# Patient Record
Sex: Male | Born: 1960 | Race: White | Hispanic: No | Marital: Single | Smoking: Current every day smoker
Health system: Southern US, Community
[De-identification: ages and names within clinical notes are randomized; demographics above are authoritative.]

## PROBLEM LIST (undated history)

## (undated) DIAGNOSIS — G8929 Other chronic pain: Secondary | ICD-10-CM

## (undated) DIAGNOSIS — B1920 Unspecified viral hepatitis C without hepatic coma: Secondary | ICD-10-CM

## (undated) DIAGNOSIS — K7460 Unspecified cirrhosis of liver: Secondary | ICD-10-CM

## (undated) DIAGNOSIS — D696 Thrombocytopenia, unspecified: Secondary | ICD-10-CM

## (undated) DIAGNOSIS — E039 Hypothyroidism, unspecified: Secondary | ICD-10-CM

## (undated) HISTORY — DX: Unspecified cirrhosis of liver: K74.60

## (undated) HISTORY — PX: CHOLECYSTECTOMY: SHX55

## (undated) HISTORY — DX: Thrombocytopenia, unspecified: D69.6

## (undated) HISTORY — PX: BACK SURGERY: SHX140

## (undated) HISTORY — DX: Unspecified viral hepatitis C without hepatic coma: B19.20

---

## 2004-02-12 ENCOUNTER — Emergency Department (HOSPITAL_COMMUNITY): Admission: EM | Admit: 2004-02-12 | Discharge: 2004-02-13 | Payer: Self-pay | Admitting: Emergency Medicine

## 2007-08-13 ENCOUNTER — Emergency Department (HOSPITAL_COMMUNITY): Admission: EM | Admit: 2007-08-13 | Discharge: 2007-08-13 | Payer: Self-pay | Admitting: Emergency Medicine

## 2007-09-09 ENCOUNTER — Emergency Department (HOSPITAL_COMMUNITY): Admission: EM | Admit: 2007-09-09 | Discharge: 2007-09-09 | Payer: Self-pay | Admitting: Emergency Medicine

## 2007-09-22 ENCOUNTER — Inpatient Hospital Stay (HOSPITAL_COMMUNITY): Admission: EM | Admit: 2007-09-22 | Discharge: 2007-10-01 | Payer: Self-pay | Admitting: Emergency Medicine

## 2007-10-15 ENCOUNTER — Emergency Department (HOSPITAL_COMMUNITY): Admission: EM | Admit: 2007-10-15 | Discharge: 2007-10-15 | Payer: Self-pay | Admitting: Emergency Medicine

## 2008-01-09 ENCOUNTER — Emergency Department (HOSPITAL_COMMUNITY): Admission: EM | Admit: 2008-01-09 | Discharge: 2008-01-09 | Payer: Self-pay | Admitting: Emergency Medicine

## 2008-03-23 ENCOUNTER — Emergency Department (HOSPITAL_COMMUNITY): Admission: EM | Admit: 2008-03-23 | Discharge: 2008-03-23 | Payer: Self-pay | Admitting: Emergency Medicine

## 2008-08-05 ENCOUNTER — Emergency Department (HOSPITAL_COMMUNITY): Admission: EM | Admit: 2008-08-05 | Discharge: 2008-08-05 | Payer: Self-pay | Admitting: Emergency Medicine

## 2008-08-27 ENCOUNTER — Emergency Department (HOSPITAL_COMMUNITY): Admission: EM | Admit: 2008-08-27 | Discharge: 2008-08-27 | Payer: Self-pay | Admitting: Emergency Medicine

## 2008-12-08 ENCOUNTER — Emergency Department (HOSPITAL_COMMUNITY): Admission: EM | Admit: 2008-12-08 | Discharge: 2008-12-08 | Payer: Self-pay | Admitting: Emergency Medicine

## 2009-03-26 ENCOUNTER — Emergency Department (HOSPITAL_COMMUNITY): Admission: EM | Admit: 2009-03-26 | Discharge: 2009-03-26 | Payer: Self-pay | Admitting: Emergency Medicine

## 2009-03-28 ENCOUNTER — Emergency Department (HOSPITAL_COMMUNITY): Admission: EM | Admit: 2009-03-28 | Discharge: 2009-03-28 | Payer: Self-pay | Admitting: Emergency Medicine

## 2009-03-30 ENCOUNTER — Emergency Department (HOSPITAL_COMMUNITY): Admission: EM | Admit: 2009-03-30 | Discharge: 2009-03-30 | Payer: Self-pay | Admitting: Emergency Medicine

## 2009-04-17 ENCOUNTER — Emergency Department (HOSPITAL_COMMUNITY): Admission: EM | Admit: 2009-04-17 | Discharge: 2009-04-17 | Payer: Self-pay | Admitting: Emergency Medicine

## 2009-08-25 ENCOUNTER — Emergency Department (HOSPITAL_COMMUNITY): Admission: EM | Admit: 2009-08-25 | Discharge: 2009-08-25 | Payer: Self-pay | Admitting: Emergency Medicine

## 2009-11-05 ENCOUNTER — Emergency Department (HOSPITAL_COMMUNITY): Admission: EM | Admit: 2009-11-05 | Discharge: 2009-11-05 | Payer: Self-pay | Admitting: Emergency Medicine

## 2009-12-16 ENCOUNTER — Emergency Department (HOSPITAL_COMMUNITY): Admission: EM | Admit: 2009-12-16 | Discharge: 2009-12-16 | Payer: Self-pay | Admitting: Emergency Medicine

## 2010-01-28 ENCOUNTER — Emergency Department (HOSPITAL_COMMUNITY): Admission: EM | Admit: 2010-01-28 | Discharge: 2010-01-28 | Payer: Self-pay | Admitting: Emergency Medicine

## 2010-04-15 ENCOUNTER — Ambulatory Visit: Payer: Self-pay | Admitting: Cardiology

## 2010-04-15 ENCOUNTER — Observation Stay (HOSPITAL_COMMUNITY): Admission: EM | Admit: 2010-04-15 | Discharge: 2010-04-17 | Payer: Self-pay | Admitting: Emergency Medicine

## 2010-04-16 ENCOUNTER — Encounter (INDEPENDENT_AMBULATORY_CARE_PROVIDER_SITE_OTHER): Payer: Self-pay | Admitting: Internal Medicine

## 2010-06-10 ENCOUNTER — Emergency Department (HOSPITAL_COMMUNITY): Admission: EM | Admit: 2010-06-10 | Discharge: 2010-06-10 | Payer: Self-pay | Admitting: Emergency Medicine

## 2010-07-31 ENCOUNTER — Emergency Department (HOSPITAL_COMMUNITY)
Admission: EM | Admit: 2010-07-31 | Discharge: 2010-07-31 | Payer: Self-pay | Source: Home / Self Care | Admitting: Emergency Medicine

## 2010-08-05 LAB — URINALYSIS, ROUTINE W REFLEX MICROSCOPIC
Bilirubin Urine: NEGATIVE
Hgb urine dipstick: NEGATIVE
Ketones, ur: NEGATIVE mg/dL
Nitrite: NEGATIVE
Protein, ur: NEGATIVE mg/dL
Specific Gravity, Urine: 1.025 (ref 1.005–1.030)
Urine Glucose, Fasting: NEGATIVE mg/dL
Urobilinogen, UA: 1 mg/dL (ref 0.0–1.0)
pH: 5 (ref 5.0–8.0)

## 2010-08-20 IMAGING — CT CT L SPINE W/O CM
3 series · 10 of 33 positions shown, 12 images · non-contrast
Comparison: Lumbar spine series 08/25/2009

CLINICAL DATA: Fell.  Back pain.

CT LUMBAR SPINE WITHOUT CONTRAST
TECHNIQUE: Multidetector CT imaging of the lumbar spine was
performed without intravenous contrast administration. Multiplanar
CT image reconstructions were also generated.

[Series 3: lumbar spine 2.0 b30s · axial · 0.28mm/px · z∈[-228,-122]mm · 2 of 116 slices shown, 3 images]
[im 36/116  soft-tissue]
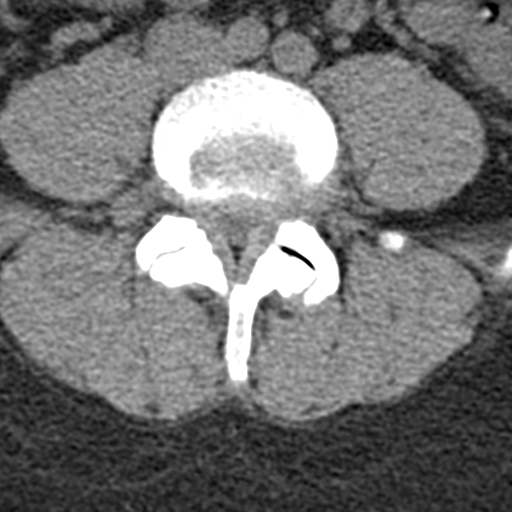
[im 36/116  bone]
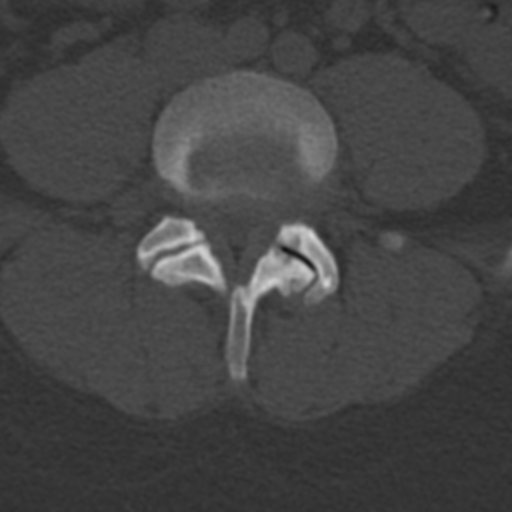
[im 89/116  bone]
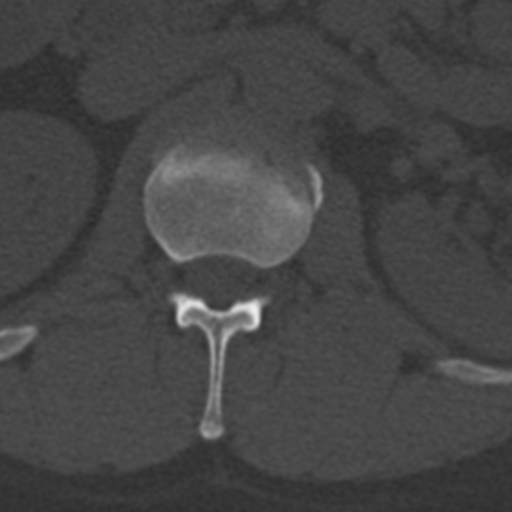

[Series 4: lumbar spine 2.0 spo cor · coronal · 0.30mm/px · 3 of 55 slices shown]
[im 11/55  bone]
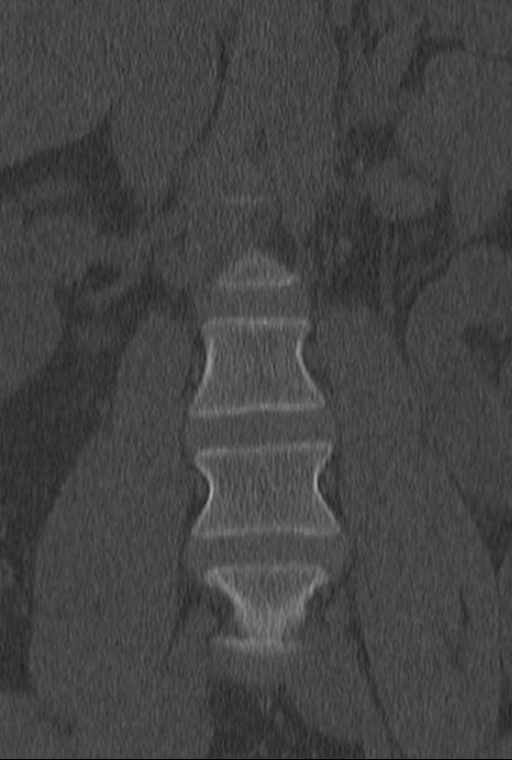
[im 22/55  bone]
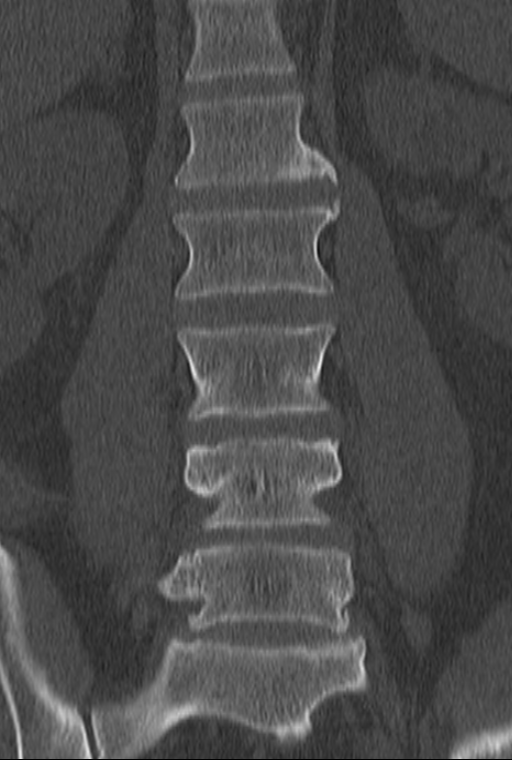
[im 33/55  bone]
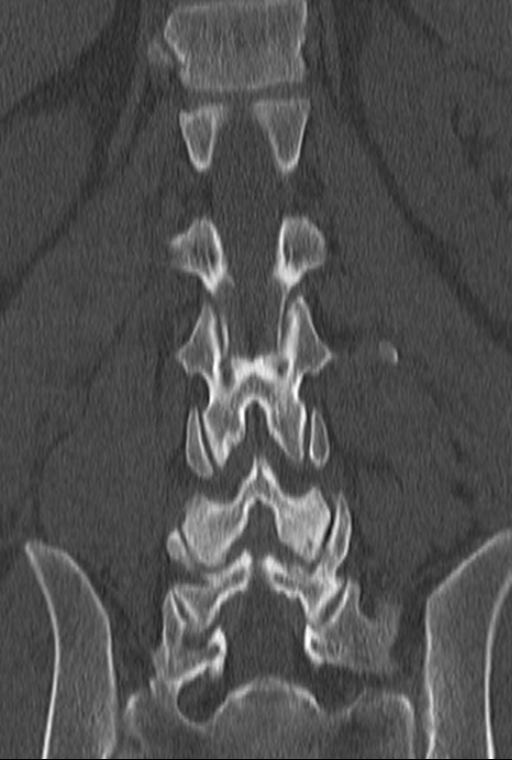

[Series 5: lumbar spine 2.0 spo sag · sagittal · 0.22mm/px · 5 of 69 slices shown, 6 images]
[im 23/69  bone]
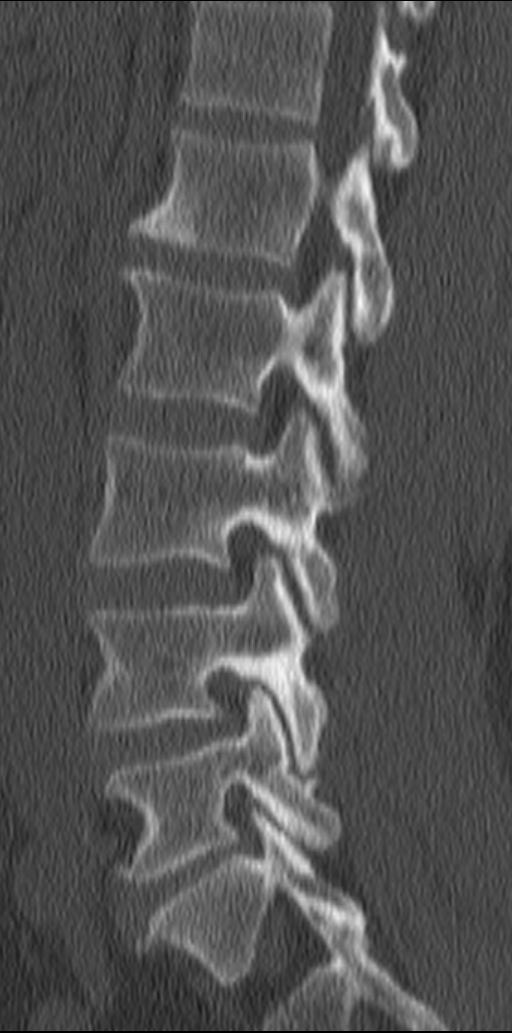
[im 29/69  bone]
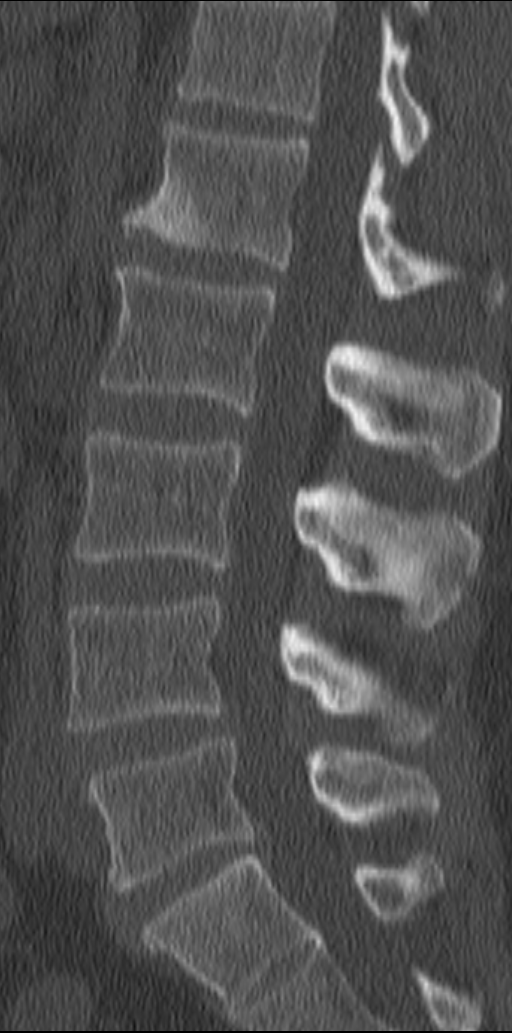
[im 35/69  soft-tissue]
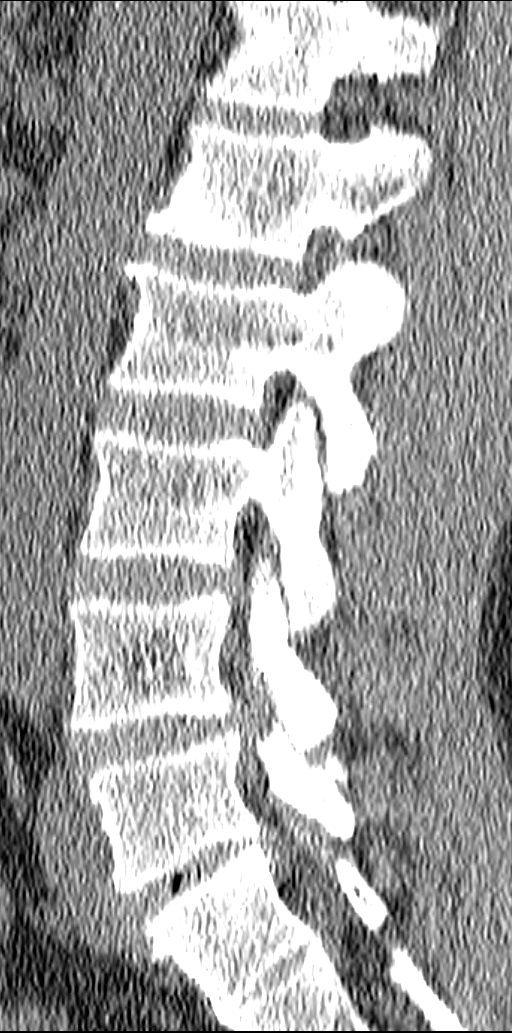
[im 35/69  bone]
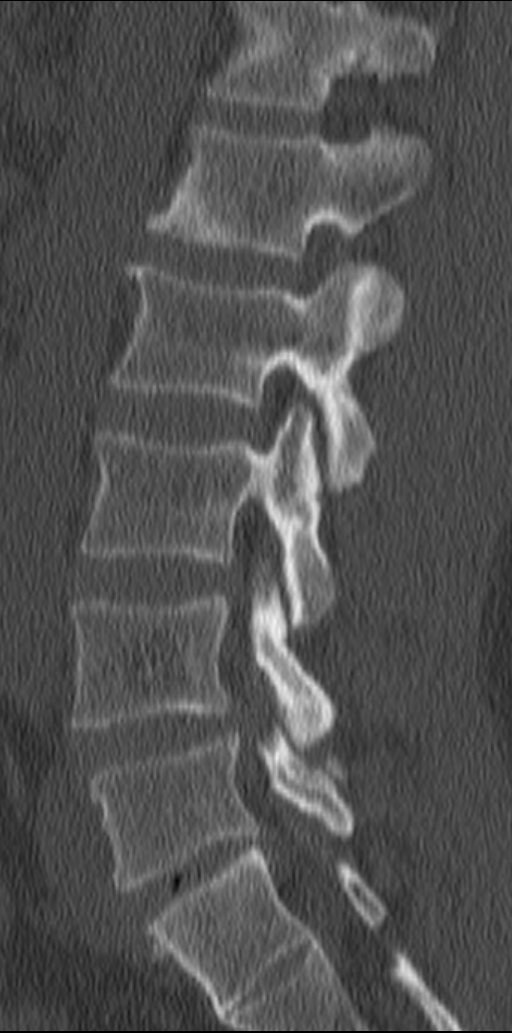
[im 40/69  bone]
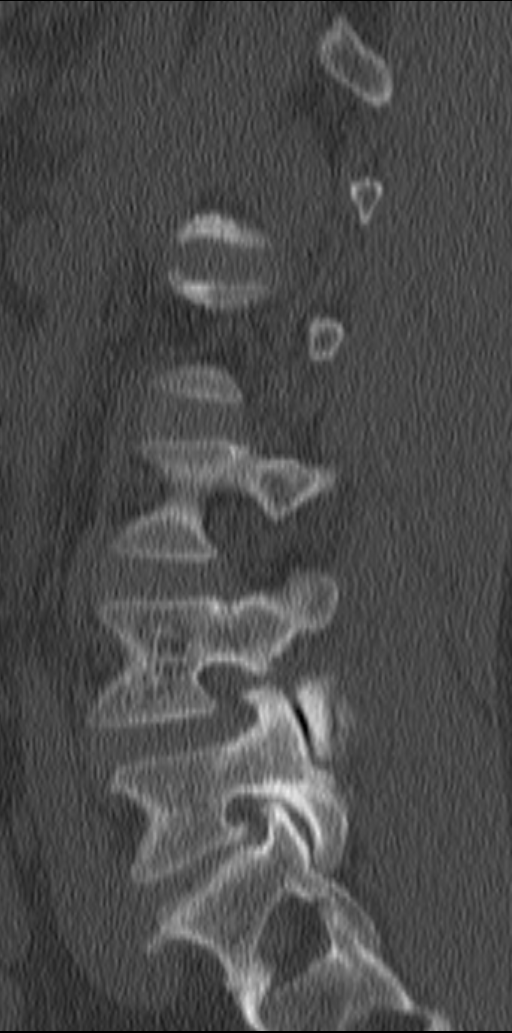
[im 46/69  bone]
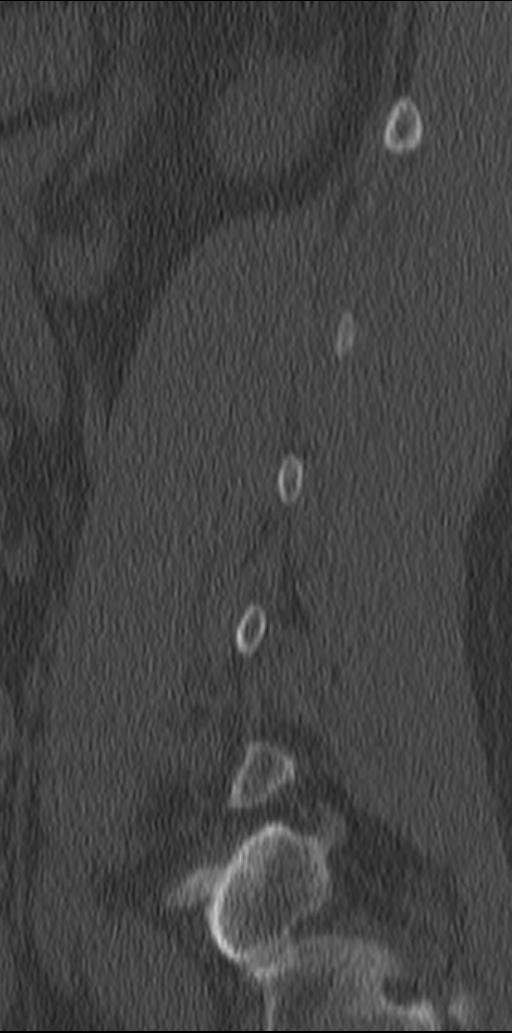

[10 of 33 positions shown; findings below may reference images not displayed]

FINDINGS: The sagittal reformatted images demonstrate normal
alignment of the lumbar vertebral bodies.  No acute fractures.
Disc spaces are maintained.  The facets are normally aligned.  No
pars defects.  Moderate facet degenerative changes in the lower
lumbar spine.

No significant retroperitoneal findings are demonstrated.

No large disc protrusions are seen.  There is moderate to moderate
severe spinal and lateral recess stenosis at L4-5 due to a broad-
based bulging annulus, short pedicles and facet disease.
IMPRESSION: 1.  No acute bony findings.
2.  Moderate facet degenerative changes in the lower lumbar spine
without facet fracture or pars defect.
3.  Moderate to moderately severe multifactorial spinal and lateral
recess stenosis at L4-5.

## 2010-09-25 LAB — LIPID PANEL
HDL: 46 mg/dL (ref >39)
Total CHOL/HDL Ratio: 4.2 RATIO
Triglycerides: 249 mg/dL — ABNORMAL HIGH (ref <150)
VLDL: 50 mg/dL — ABNORMAL HIGH (ref 0–40)

## 2010-09-25 LAB — FOLATE: Folate: 18.3 ng/mL

## 2010-09-25 LAB — CBC
HCT: 43.7 % (ref 39.0–52.0)
Hemoglobin: 15.1 g/dL (ref 13.0–17.0)
MCH: 32.5 pg (ref 26.0–34.0)
MCHC: 34.6 g/dL (ref 30.0–36.0)
MCV: 94 fL (ref 78.0–100.0)
Platelets: 115 10*3/uL — ABNORMAL LOW (ref 150–400)
RBC: 4.65 MIL/uL (ref 4.22–5.81)
RDW: 12.5 % (ref 11.5–15.5)
WBC: 5.6 10*3/uL (ref 4.0–10.5)

## 2010-09-25 LAB — URINALYSIS, ROUTINE W REFLEX MICROSCOPIC
Glucose, UA: NEGATIVE mg/dL
Hgb urine dipstick: NEGATIVE
Ketones, ur: NEGATIVE mg/dL
Protein, ur: NEGATIVE mg/dL
pH: 5.5 (ref 5.0–8.0)

## 2010-09-25 LAB — CARDIAC PANEL(CRET KIN+CKTOT+MB+TROPI)
CK, MB: 0.8 ng/mL (ref 0.3–4.0)
CK, MB: 0.9 ng/mL (ref 0.3–4.0)
Relative Index: INVALID (ref 0.0–2.5)
Troponin I: <0.01 ng/mL (ref 0.00–0.06)

## 2010-09-25 LAB — DIFFERENTIAL
Eosinophils Relative: 7 % — ABNORMAL HIGH (ref 0–5)
Lymphocytes Relative: 53 % — ABNORMAL HIGH (ref 12–46)
Lymphs Abs: 3 10*3/uL (ref 0.7–4.0)
Monocytes Absolute: 0.4 10*3/uL (ref 0.1–1.0)
Monocytes Relative: 7 % (ref 3–12)
Neutro Abs: 1.8 10*3/uL (ref 1.7–7.7)

## 2010-09-25 LAB — COMPREHENSIVE METABOLIC PANEL
AST: 40 U/L — ABNORMAL HIGH (ref 0–37)
Albumin: 3.7 g/dL (ref 3.5–5.2)
BUN: 10 mg/dL (ref 6–23)
Chloride: 100 mEq/L (ref 96–112)
Creatinine, Ser: 1 mg/dL (ref 0.4–1.5)
GFR calc Af Amer: >60 mL/min (ref >60)
Total Bilirubin: 0.8 mg/dL (ref 0.3–1.2)
Total Protein: 7.1 g/dL (ref 6.0–8.3)

## 2010-09-25 LAB — RAPID URINE DRUG SCREEN, HOSP PERFORMED
Amphetamines: NOT DETECTED
Benzodiazepines: NOT DETECTED
Cocaine: NOT DETECTED

## 2010-09-25 LAB — POCT CARDIAC MARKERS
Myoglobin, poc: 54.6 ng/mL (ref 12–200)
Myoglobin, poc: 56 ng/mL (ref 12–200)

## 2010-09-25 LAB — AMMONIA: Ammonia: 47 umol/L — ABNORMAL HIGH (ref 11–35)

## 2010-09-25 LAB — VITAMIN B12: Vitamin B-12: 1160 pg/mL — ABNORMAL HIGH (ref 211–911)

## 2010-10-01 LAB — COMPREHENSIVE METABOLIC PANEL
ALT: 24 U/L (ref 0–53)
Alkaline Phosphatase: 62 U/L (ref 39–117)
BUN: 17 mg/dL (ref 6–23)
CO2: 24 mEq/L (ref 19–32)
Chloride: 103 mEq/L (ref 96–112)
GFR calc non Af Amer: >60 mL/min (ref >60)
Glucose, Bld: 109 mg/dL — ABNORMAL HIGH (ref 70–99)
Potassium: 3.7 mEq/L (ref 3.5–5.1)
Sodium: 135 mEq/L (ref 135–145)
Total Bilirubin: 0.3 mg/dL (ref 0.3–1.2)

## 2010-10-01 LAB — CBC
HCT: 42.1 % (ref 39.0–52.0)
Hemoglobin: 14.8 g/dL (ref 13.0–17.0)
RBC: 4.57 MIL/uL (ref 4.22–5.81)

## 2010-10-01 LAB — DIFFERENTIAL
Basophils Absolute: 0 10*3/uL (ref 0.0–0.1)
Basophils Relative: 0 % (ref 0–1)
Eosinophils Absolute: 0.3 10*3/uL (ref 0.0–0.7)
Monocytes Relative: 7 % (ref 3–12)
Neutrophils Relative %: 38 % — ABNORMAL LOW (ref 43–77)

## 2010-10-01 LAB — URINALYSIS, ROUTINE W REFLEX MICROSCOPIC
Glucose, UA: NEGATIVE mg/dL
Nitrite: NEGATIVE
Specific Gravity, Urine: 1.025 (ref 1.005–1.030)
pH: 5 (ref 5.0–8.0)

## 2010-10-07 ENCOUNTER — Emergency Department (HOSPITAL_COMMUNITY): Payer: Self-pay

## 2010-10-07 ENCOUNTER — Emergency Department (HOSPITAL_COMMUNITY)
Admission: EM | Admit: 2010-10-07 | Discharge: 2010-10-07 | Disposition: A | Discharge status: Home / Self Care | Payer: Self-pay | Attending: Emergency Medicine | Admitting: Emergency Medicine

## 2010-10-07 DIAGNOSIS — W010XXA Fall on same level from slipping, tripping and stumbling without subsequent striking against object, initial encounter: Secondary | ICD-10-CM | POA: Insufficient documentation

## 2010-10-07 DIAGNOSIS — S79919A Unspecified injury of unspecified hip, initial encounter: Secondary | ICD-10-CM | POA: Insufficient documentation

## 2010-10-07 DIAGNOSIS — Y92009 Unspecified place in unspecified non-institutional (private) residence as the place of occurrence of the external cause: Secondary | ICD-10-CM | POA: Insufficient documentation

## 2010-10-07 DIAGNOSIS — M5430 Sciatica, unspecified side: Secondary | ICD-10-CM | POA: Insufficient documentation

## 2010-10-18 LAB — CULTURE, ROUTINE-ABSCESS

## 2010-10-29 LAB — CBC
HCT: 46.6 % (ref 39.0–52.0)
MCV: 94.3 fL (ref 78.0–100.0)
Platelets: 147 10*3/uL — ABNORMAL LOW (ref 150–400)
RBC: 4.95 MIL/uL (ref 4.22–5.81)
WBC: 8.4 10*3/uL (ref 4.0–10.5)

## 2010-10-29 LAB — BASIC METABOLIC PANEL
BUN: 17 mg/dL (ref 6–23)
CO2: 27 mEq/L (ref 19–32)
Chloride: 102 mEq/L (ref 96–112)
Potassium: 3.7 mEq/L (ref 3.5–5.1)

## 2010-10-29 LAB — DIFFERENTIAL
Eosinophils Absolute: 0.2 10*3/uL (ref 0.0–0.7)
Eosinophils Relative: 2 % (ref 0–5)
Lymphs Abs: 2.7 10*3/uL (ref 0.7–4.0)
Monocytes Relative: 5 % (ref 3–12)

## 2010-11-26 NOTE — Group Therapy Note (Signed)
NAME:  Jeremy Weeks, Jeremy Weeks               ACCOUNT NO.:  [id number]   MEDICAL RECORD NO.:  [id number]          PATIENT TYPE:  INP   LOCATION:  A228                          FACILITY:  [facility]   PHYSICIAN:  Skeet Latch, DO    DATE OF BIRTH:  March 14, 1961   DATE OF PROCEDURE:  09/29/2007  DATE OF DISCHARGE:                                 PROGRESS NOTE   SUBJECTIVE:  Patient continues to be improved.  Patient states that his  breathing feels better.  Once he receives pain medications, he can take  a deep inspiration and have a productive cough.  No fevers or chills.  Patient's intake is improving.  The patient was seen by ACT team and  agreed to have outpatient treatment.   OBJECTIVE:  GENERAL:  He is awake, alert and oriented in no acute  distress.  VITALS:  Temperature is 98.7, pulse 110, respirations 22, blood pressure  136/72.  He is satting at 92% on 2 liters.  CARDIOVASCULAR:  He is tachycardic.  S1 and S2 is regular.  No murmurs  or gallops.  LUNGS:  Decreased breath sounds in the right upper lobe.  Left is clear.  No rhonchi or wheezing.  ABDOMEN:  Soft, nontender, nondistended.  Positive bowel sounds.  EXTREMITIES:  Trace edema.  No tenderness or erythema.   LABS:  Sodium is 133, potassium 3.1, chloride 99, CO2 25, glucose 110,  BUN 7, creatinine 0.77.  His white blood cell count is 11.8, hemoglobin  12.8, hematocrit 36.3, platelet count 157.   ASSESSMENT/PLAN:  1. Acute respiratory failure requiring mechanical ventilation:      Patient seems to be doing better.  He continues to be on nasal      cannula and seems to be improving.  Shortness of breath has      improved.  Patient is being followed by pulmonology.  2. Pneumonia:  Likely aspiration.  Patient continues to be on IV      antibiotics.  We will continue this and continue to follow his      white count.  Patient is afebrile.  3. Acute rhabdomyolysis secondary to prior cocaine use.  We will      continue to follow PT  levels and IV fluids.  4. Suicide/drug overdose:  Patient will be evaluated by the ACT team.      Patient did sign a release stating that he will get outpatient      treatment.  Will continue to monitor.  He will continue to be      counseled before he is discharged.  5. Thrombocytopenia:  Will continue to monitor.  6. Nicotine abuse:  Patient is on a patch and will continue that at      this time.  7. Hyponatremia:  Will hep lock his IV at this time and recheck his      sodium in the morning.  8. Hypokalemia:  We will replace orally.      Skeet Latch, DO  Electronically Signed    SM/MEDQ  D:  09/29/2007  T:  09/29/2007  Job:  [id number]

## 2010-11-26 NOTE — Group Therapy Note (Signed)
NAME:  Jeremy Weeks, Jeremy Weeks               ACCOUNT NO.:  [id number]   MEDICAL RECORD NO.:  [id number]          PATIENT TYPE:  INP   LOCATION:  IC03                          FACILITY:  [facility]   PHYSICIAN:  Edward L. Juanetta Gosling, M.D.DATE OF BIRTH:  [birth year]   DATE OF PROCEDURE:  DATE OF DISCHARGE:                                 PROGRESS NOTE   SUBJECTIVE:  Jeremy Weeks seems to be about the same.  He remains somewhat  short of breath and his x-ray actually shows some left lung changes now  as well.  His right lung is still densely consolidated.   PHYSICAL EXAMINATION:  VITALS:  His physical examination otherwise,  pulse is about 110, blood pressure in the 100 range.  CHEST:  Shows decreased breath sounds.  HEART:  Regular.   LABORATORY DATA:  Laboratory work, white count 14,900 so that is better,  hemoglobin 12.7, platelets 107, blood gas on 3 liters, pO2 of 59, pCO2  of 31, pH 7.43 with an O2 sat of 92%.  B met shows potassium 3.4,  otherwise normal and his CK 1769.   ASSESSMENT:  He has what I suspect is aspiration pneumonia.  He has had  a drug overdose in a suicide attempt.  He will need to be reevaluated by  the ACT team but he cannot be medically cleared as yet to be  transferred. He has elevated CPK which is of unknown etiology but may be  related to his cocaine use and he has a significant pneumonia.  We are  going to try to get him up.      Edward L. Juanetta Gosling, M.D.  Electronically Signed     ELH/MEDQ  D:  09/27/2007  T:  09/27/2007  Job:  [id number]

## 2010-11-26 NOTE — Group Therapy Note (Signed)
NAME:  Jeremy, Weeks               ACCOUNT NO.:  [id number]   MEDICAL RECORD NO.:  [id number]          PATIENT TYPE:  INP   LOCATION:  IC03                          FACILITY:  [facility]   PHYSICIAN:  Edward L. Juanetta Gosling, M.D.DATE OF BIRTH:  12-Sep-1960   DATE OF PROCEDURE:  DATE OF DISCHARGE:                                 PROGRESS NOTE   Jeremy Weeks looks a little better this morning.  He says he is having  more chest pain.  He is coughing up some sputum and now has some blood  mixed in it. He does not look as dyspneic.  Otherwise he has no new  complaints.   PHYSICAL EXAM:  VITALS:  Shows his heart rate about 113, blood pressure  in the 120s.  CHEST:  Still shows decreased breath sounds and some rhonchi but he is  clearer than he was yesterday.  HEART:  Regular without gallop but with a tachycardia.  EXTREMITIES:  Showed no edema.  CENTRAL NERVOUS SYSTEM:  Examination grossly intact.   LABORATORY DATA:  His lab work this morning shows blood gas on 2 liters.  His pO2 63, pCO2 of 38, pH 7.39.  CBC shows white count 10,800,  hemoglobin 12.6, platelets 135.  Liver function panel shows SGOT is 61,  albumin is 2.3. B met shows potassium of 3.3 but normal electrolytes  otherwise. His CK is down to 887.  His magnesium is 1.9.  Chest x-ray  still shows a right lower lobe pneumonia.   ASSESSMENT:  He has pneumonia.  He has had a drug overdose with  amitriptyline as well as illicit drugs of cocaine.  He does seem better.  I think he is probably okay to move from the intensive care unit today.  He of course still has to be evaluated by the ACT team again and then  decide what to do from there.      Edward L. Juanetta Gosling, M.D.  Electronically Signed     ELH/MEDQ  D:  09/28/2007  T:  09/28/2007  Job:  [id number]

## 2010-11-26 NOTE — Group Therapy Note (Signed)
NAME:  Jeremy Weeks, Jeremy Weeks               ACCOUNT NO.:  [id number]   MEDICAL RECORD NO.:  [id number]          PATIENT TYPE:  INP   LOCATION:  A228                          FACILITY:  [facility]   PHYSICIAN:  Edward L. Juanetta Gosling, M.D.DATE OF BIRTH:  [birth year]   DATE OF PROCEDURE:  DATE OF DISCHARGE:                                 PROGRESS NOTE   [name] seems to be doing much better.  He is a patient of the [facility] hospitalist team.  He says he is getting stronger.  He is  coughing up some sputum.  He is not having much new problem.   EXAM:  This morning shows a temperature of 97.7, pulse is 113,  respirations 22, blood pressure 126/71, O2 sats 92% on 2 liters.  His  chest is clearer.  He still coughing up some blood.  His heart is  regular.  CBC shows white count is down to 14,000, hemoglobin is 11.6,  platelets, 264.  His BMET is normal, so I think he is getting much  closer to being ready for discharge.  His chest x-ray yesterday shows  improvement.   Dictation Ended At This [employer]. Juanetta Gosling, M.D.  Electronically Signed     ELH/MEDQ  D:  10/01/2007  T:  10/01/2007  Job:  [id number]

## 2010-11-26 NOTE — Group Therapy Note (Signed)
NAME:  Jeremy Weeks, Jeremy Weeks               ACCOUNT NO.:  [id number]   MEDICAL RECORD NO.:  [id number]          PATIENT TYPE:  INP   LOCATION:  A228                          FACILITY:  [facility]   PHYSICIAN:  Edward L. Juanetta Gosling, M.D.DATE OF BIRTH:  1961-03-20   DATE OF PROCEDURE:  09/29/2007  DATE OF DISCHARGE:                                 PROGRESS NOTE   Patient of the [facility] Team.   Jeremy Weeks says he does not feel as well but he looks better this  morning.  He is still somewhat short of breath and he is still coughing  up some sputum which is blood-tinged.  He was evaluated by the ACT team  yesterday, saying he signed a contract to agree to go and do outpatient  therapy.   His physical examination this morning shows his temperature is 98, pulse  103, respirations 22, blood pressure 150/91, O2 saturation is 95%.  His chest is a little clearer than it has been, still with some rhonchi.  His heart is regular without gallop.   His white blood count 11,800, hemoglobin is 12.8, platelets 157.  His  potassium is 3.1, sodium slightly low 133, blood sugar was 110.   ASSESSMENT:  He has pneumonia, likely aspiration on the basis of his  drug overdose and suicide attempt, or least suicide gesture.  He has had  a drug overdose.  He is clearing up but is not clear.   My plan, then, would be to continue his treatments, continue trying to  get him up and moving, getting him to try to cough up sputum if  possible, get another chest x-ray in the morning, and we can get a PA  and lateral chest, which will help some with further evaluation.  He has  only had portable chests thus far.      Edward L. Juanetta Gosling, M.D.  Electronically Signed     ELH/MEDQ  D:  09/29/2007  T:  09/29/2007  Job:  [id number]

## 2010-11-26 NOTE — Group Therapy Note (Signed)
NAME:  SPURGEON, GANCARZ               ACCOUNT NO.:  [id number]   MEDICAL RECORD NO.:  [id number]          PATIENT TYPE:  INP   LOCATION:  IC03                          FACILITY:  [facility]   PHYSICIAN:  Margaretmary Dys, M.D.DATE OF BIRTH:  17-Aug-1960   DATE OF PROCEDURE:  09/27/2007  DATE OF DISCHARGE:                                 PROGRESS NOTE   SUBJECTIVE:  Mr. Massing remains fairly short of breath.  His respiratory  rate  has been ranging between 25 to 35.  He complains of some right-  sided pleuritic chest pain.  He has had no fever or chills.  He still  remains tachycardic.  Patient is able to take p.o. now.   The patient's  family including his brother and sister were present at  his bedside.  Patient is not medically clear yet for a transfer for  psychiatric evaluation.  We appreciate the continued input by Dr.  Juanetta Gosling.   OBJECTIVE:  Patient was conscious alert, in mild respiratory distress.  VITAL SIGNS:  Blood pressure was 127/68 with a pulse ranging between 108  to 110.  T-max was 98.7 degrees Fahrenheit.  Oxygen saturation was 96%  on 3 liters of nasal cannula.  HEENT:  Normocephalic, atraumatic.  Oral mucosa was moist.  No exudates.  Patient has very poor oral dentition.  NECK:  Supple.  No JVD, or lymphadenopathy.  LUNGS:  Reduced anterior bilateral with occasional crackles, especially  on the right base.  HEART:  S1, S2, regular, No S3, S4,  gallops or rubs.  ABDOMEN:  Soft, not tender.  Bowel sounds positive.  No masses palpable.  EXTREMITIES:  No pitting pedal edema.  No calf induration or tenderness  was noted.  CENTRAL NERVOUS SYSTEM:  She was conscious, alert, following commands.  No focal or neurological deficits.   LABORATORY/DIAGNOSTIC DATA:  Blood gas showed a pH of 7.436, PCO2 36,  PO2 of 59.5, bicarbonate 20.9 with a base deficit of 2.7.  white blood  cell count was down to 14.9, hemoglobin 12.7, hematocrit 35.3, platelet  count was 107, neutrophils were  82% with a left shift.  Sodium 137,  potassium 3.4, chloride of 107, CO2 is 24, glucose 82, BUN of 8,  creatinine 0.87.  Creatine kinase is now down to 1,769.   ASSESSMENT AND PLAN:  1. Acute respiratory failure requiring mechanical ventilation.      Patient was extubated and has slowly improved.  His blood gas still      shows a mild hypoxemia and patient is still tachypneic.  His chest      x-ray is also suggestive of some new infiltrates on the left side.      Plan is to continue on Zosyn.  I have just been informed that      patient's sputum cultures came back with Escherichia coli.  Would      discontinue vancomycin and continue only on Zosyn for now.  If      patient does not show any significant improvement we may add      Levaquin to this, but I do  believe that Zosyn should be enough.  2. Acute rhabdomyolysis likely secondary to cocaine use.  His CPK      level is improving.  We will continue intravenous fluid hydration.      I do not see an indication to alkalinize his urine at this point as      the CPK level was not significantly elevated and numbers are      beginning to improve anyway.  3. Parasuicide/drug overdose.  Patient is not suitable for transfer at      this time to a psychiatric facility as he still has active medial      issues.  4. Thrombocytopenia.  Patient remains off Lovenox , is on sequential      compression device.  We will be getting him up to ambulate today.      Etiology of his thrombocytopenia remains unclear but may be related      to acute infectious process.  It is also a possibility patient may      have chronic liver disease from perhaps past alcohol use.  5. Chronic nicotine abuse.  Patient is currently on nicotine patch,      briefly discussed smoking cessation with him.   DISPOSITION:  There is no evidence of alcohol withdrawal at this time  but we will watch him closely for any other suggestions of drug  withdrawal.  Continue current IV  antibiotics.  We will continue to  monitor him in the ICU.  His blood gas is only marginally improved.   Continue IV fluids for rhabdomyolysis.   CRITICAL CARE TIME:  Total critical time spent in the care of the  patient excluding procedures was 36 minutes.      Margaretmary Dys, M.D.  Electronically Signed     AM/MEDQ  D:  09/27/2007  T:  09/27/2007  Job:  [id number]

## 2010-11-26 NOTE — Consult Note (Signed)
NAME:  Jeremy Weeks, Jeremy Weeks               ACCOUNT NO.:  [id number]   MEDICAL RECORD NO.:  [id number]          PATIENT TYPE:  INP   LOCATION:  IC03                          FACILITY:  [facility]   PHYSICIAN:  Kofi A. Gerilyn Pilgrim, M.D. DATE OF BIRTH:  28-Feb-1961   DATE OF CONSULTATION:  09/23/2007  DATE OF DISCHARGE:                                 CONSULTATION   HISTORY OF PRESENT ILLNESS:  The patient is a 50 year old white male who  apparently has a history of chronic low back pain and hypothyroidism. He  got into an argument with his girlfriend and apparently took a bunch of  medication, reported to be amitriptyline. This has not been confirmed  but the girlfriend did report that he took a lot of his medications. The  patient was taken to the hospital by EMS and was felt to have myoclonic  jerking activity. He was intubated for airway protection. He does have a  history of illicit drug use with a positive urine drug screen on  admission.   PAST MEDICAL HISTORY:  1. Chronic low back pain.  2. Degenerative disk disease.  3. Hypothyroidism.   ADMISSION MEDICATIONS:  Synthroid, Flexeril, hydrocodone, and  amitriptyline.   ALLERGIES:  CODEINE.   PAST SURGICAL HISTORY:  1. Cholecystectomy.   SOCIAL HISTORY:  Lives with his girlfriend. Smokes 2 to 3 packs per day.  No alcohol abuse. He is a known cocaine user.   PHYSICAL EXAMINATION:  GENERAL:  Examination shows a man who is  intubated.  VITAL SIGNS:  Blood pressure 170/84, pulse 80.  NECK:  Supple.  HEENT:  Normocephalic and atraumatic.  ABDOMEN:  Soft.  EXTREMITIES:  No edema.   MENTAL STATUS EXAM:  The patient was examined a couple of minutes after  the Diprivan was discontinued, the patient became agitated and started  thrashing around. I saw no myoclonic activity, however. There was no eye  opening. On cranial nerve evaluation, pupils are 5 mm and reactive.  Oculocephalic reflexes are intact. Corneal reflexes are present but they  are diminished. Gag reflex intact. Most of the examination, again he  thrashes all 4 extremities vigorously. Sensation in response to painful  stimuli bilaterally. Coordination shows no tremors or dysmetria.  Reflexes are symmetric.   LABORATORY DATA:  Acetaminophen, alcohol and salicylates are negative.  Urine drug screen is positive for metabolite of cocaine and  benzodiazepines. Urinalysis, specific gravity of 1020. Also ketones  trace, otherwise negative. WBC 8.4. Hemoglobin 16, platelet count of  163,000. Sodium 139, potassium 3.4, chloride 105, CO2 24, glucose 135,  BUN 19, creatinine 1.0. Calcium 9.   ASSESSMENT:  1. Acute myoclonic jerks due to overdose from medication, suspected      amitriptyline.  2. Encephalopathy, also due to overdose and medication effect.   RECOMMENDATIONS:  Continue with observation and repeat neurological  evaluation. The patient probably may be weaned tomorrow. If the patient  continues to have myoclonic jerks, low-dose benzodiazepines, such as  valium, can be used on a p.r.n. basis. I will continue to followup his  amitriptyline level, which has been ordered.  Kofi A. Gerilyn Pilgrim, M.D.  Electronically Signed     KAD/MEDQ  D:  09/23/2007  T:  09/23/2007  Job:  [id number]

## 2010-11-26 NOTE — Group Therapy Note (Signed)
NAME:  Jeremy Weeks, Jeremy Weeks               ACCOUNT NO.:  [id number]   MEDICAL RECORD NO.:  [id number]          PATIENT TYPE:  INP   LOCATION:  IC03                          FACILITY:  [facility]   PHYSICIAN:  Edward L. Juanetta Gosling, M.D.DATE OF BIRTH:  [birth year]   DATE OF PROCEDURE:  DATE OF DISCHARGE:                                 PROGRESS NOTE   PRIMARY CARE PHYSICIAN:  [facility] team.   SUBJECTIVE:  Jeremy Weeks is still doing fairly well, but he is  complaining of right-sided chest discomfort.  He has no other  complaints.  He is coughing.  He is bringing up some sputum.  His  physical examination shows he is awake and alert.  He does have a  shallow respiration.  Heart rate is about 120, blood pressure in the  120's also.  His chest shows decreased breath sounds on the right and  fairly diminished respiratory effort particularly, as he seems to be  splinting his right side.  His electrolytes were normal.  BUN is 13,  creatinine 1.05.  Cardiac panel still shows a CK of over 3,000, and his  chest x-ray shows marked right lower lobe infiltration since yesterday.   ASSESSMENT:  I think pretty clearly he is aspirated.  He is currently on  Zosyn and vancomycin, so he is well covered for that.  I am going to try  to get him an incentive spirometer etc., and see if we can get him  taking deeper breaths.  He may need something for pain to help him with  that.   My assessment is that he has a right lower lobe pneumonia, probably  related to aspiration.  He has had a drug overdose.  He has elevated  CPK.  He has a history of cocaine abuse, and that may be causing the  elevated CPK.   PLAN:  Try the incentive spirometer, see if we can him to take a deeper  breath. Continue with __________ cough and follow.      Edward L. Juanetta Gosling, M.D.  Electronically Signed     ELH/MEDQ  D:  09/26/2007  T:  09/26/2007  Job:  [id number]

## 2010-11-26 NOTE — Group Therapy Note (Signed)
NAME:  Jeremy Weeks, Jeremy Weeks               ACCOUNT NO.:  [id number]   MEDICAL RECORD NO.:  [id number]          PATIENT TYPE:  INP   LOCATION:  IC03                          FACILITY:  [facility]   PHYSICIAN:  Mobolaji B. Bakare, M.D.DATE OF BIRTH:  02-09-1961   DATE OF PROCEDURE:  09/24/2007  DATE OF DISCHARGE:                                 PROGRESS NOTE   Mr. Vorndran is awake now, able to follow commands.  Sedation was turned  off about 5:30 a.m.  He has been seen by Dr. Juanetta Gosling, and the plan today  is to extubate him.   PHYSICAL EXAMINATION:  CURRENT VITAL SIGNS:  Heart rate is 85, blood  pressure 159/95, O2 sats 100% on 40%, respiratory rate 21.  GENERAL:  Patient is comfortable.  HEENT:  His pupils are equal and reactive to light.  Was able to squeeze  five fingers.  He is somewhat biting on the tube.  The ET tube  __________ just above the clavicles.  LUNGS:  Good air entry bilaterally.  CV:  S1, S2 regular.  ABDOMEN:  Nondistended, soft, nontender, bowel sounds present.  EXTREMITIES:  No pedal edema or calf tenderness.   LABORATORY DATA:  ABG done this morning showed pH 7.38, PCO2 39.9, PO2  492, O2 sats 97% on FIO2 of 40%.  Sodium 140, potassium 4, chloride 111,  bicarb 25, glucose 91, BUN 13, creatinine 0.43.  Calcium 8.8.  White  cells 8, hemoglobin 40.1, hematocrit 39.8, platelets 128, normal  differential.  Urine tricyclic screen is positive.  Triglycerides 87,  total cholesterol 182.   Chest x-ray showed no acute findings.   ASSESSMENT/PLAN:  1. Tricyclic overdose.  The patient is currently awake.  The plan is      to extubate him today and continue supportive care.  He will need a      24-hour sitter.  ACT team will be asked to evaluate patient.  QRS      complex has been within normal.  The patient is no longer      tachycardic.  2. Myoclonic jerks.  He was reported to have had some more jerks      overnight.  However, none have been noticed this morning.  He has      been  seen by Dr. Gerilyn Pilgrim, and recommendation is to use Valium if      more clonic jerks.  3. Cocaine abuse.  He will need chemical dependence rehabilitation      referral.  4. Leukocytosis.  It is probably reactive.  It has resolved.  5. Thrombocytopenia.  Will discontinue Lovenox and monitor platelets.      Use serial compression devices for deep venous thrombosis      prophylaxis, and ambulate early.   DISPOSITION:  Will obtain evaluation by ACT team for appropriate  disposal.      Mobolaji B. Corky Downs, M.D.  Electronically Signed     MBB/MEDQ  D:  09/24/2007  T:  09/24/2007  Job:  [id number]

## 2010-11-26 NOTE — H&P (Signed)
NAME:  Jeremy Weeks, Jeremy Weeks               ACCOUNT NO.:  [id number]   MEDICAL RECORD NO.:  [id number]          PATIENT TYPE:  INP   LOCATION:  IC03                          FACILITY:  [facility]   PHYSICIAN:  Osvaldo Shipper, MD     DATE OF BIRTH:  04/16/1961   DATE OF ADMISSION:  09/22/2007  DATE OF DISCHARGE:  LH                              HISTORY & PHYSICAL   PRIMARY CARE PHYSICIAN:  [facility]   ADMISSION DIAGNOSES:  1. Possible tricyclic antidepressant toxicity.  2. Cocaine abuse.  3. History of chronic back pain.  4. History of hypothyroidism.   CHIEF COMPLAINT:  Agitation.   HISTORY OF PRESENT ILLNESS:  The patient is a 50 year old Caucasian male  who has a history of chronic back pain and hypothyroidism, who was in  his usual state of health until this evening when he got into an  argument with his girlfriend at home.  Apparently the patient quit his  job yesterday.  He lays roofs on houses.  The reason for the argument  was this employment situation.  The patient, after the argument,  apparently took some medications.  The girlfriend says that these were  amitriptyline tablets.  Unfortunately, this pill bottle was not brought  into the hospital; neither do we know if  this was really amitriptyline  or not, nor do we know the dosage of these pills.  The patient is also  known to use illicit drugs as well.  Currently the patient is intubated,  sedated and unable to give any history.   MEDICATIONS AT HOME:  1. Synthroid 115 mcg once a day.  2. Flexeril 10 mg.  3. Lortab 7.5 mg as needed.  Apparently he is also on amitriptyline, though I cannot find reports of  this in previous records.   ALLERGIES:  CODEINE which causes a rash.   PAST MEDICAL HISTORY:  Positive for chronic back pain, degenerative disk  disease, hypothyroidism.  He has had a cholecystectomy in the past.   SOCIAL HISTORY:  Lives with his girlfriend.  He  smokes 2-3 packs of  cigarettes on a daily basis.  No alcohol use.  He recently quit his  employment just yesterday.  He has been known to do cocaine in the past.   FAMILY HISTORY:  Unable to do.   REVIEW OF SYSTEMS:  Unable to do.   PHYSICAL EXAMINATION:  VITAL SIGNS:  Temperature 98.8, blood pressure  133/88, heart rate 140-150 and sinus, respiratory rate 20, saturation 98-  100%.  GENERAL EXAM:  A well-developed, well-nourished individual.  He was  noted to have myoclonic jerk movements of his upper and lower  extremities.  HEENT:  Reveals no pallor and no icterus.  His pupils are equal; no  rolling back of his eyes are noted.  NECK:  Soft and supple.  No thyromegaly appreciated.  LUNGS:  Clear to auscultation bilaterally anteriorly.  CARDIOVASCULAR:  S1 and S2 is normal; it is actually tachycardiac,  regular.  I do not appreciate any murmurs.  ABDOMEN:  Soft,  nontender, nondistended.  EXTREMITIES:  Show no edema.  NEUROLOGIC:  The patient is sedated.  He has had some myoclonic periods  of his upper and lower extremities.   LAB DATA:  CBC is unremarkable.  BMET:  Potassium 3.4, glucose 135, BUN  19, creatinine 1.0.  LFTs are pending.  Cardiac markers were negative.  Tylenol and aspirin levels are negative.  Urine drug screen positive for  benzodiazepines and cocaine.  Alcohol level less than 5.   He had a chest x-ray post-intubation which showed no acute findings.   A couple of EKGs were done, which show sinus tachycardia.  The axis  shows a level leftward shift.  There was a prominent R-wave in AVR; QRS  98.  His QTC is 447.  He does not have any other acute ST changes.   ASSESSMENT:  This is a 50 year old Caucasian male with medical history  as stated above.  He presents agitated and is thought to have drug OD.  It is possible that this OD is with tricyclics in the form of  amitriptyline.  His drug screen is also positive for cocaine.  So, there  is a combination for  drugs that could be causing this presentation.   1. Possible Tricyclic Toxicity.  We will monitor this patient closely      in intensive care unit.  Do EKGs every 4 hours to monitor QRS      interval.  I did discuss the case with Dr. Sherene Sires at [facility], who      recommended close monitoring.  At this point he did not feel      transfer to [facility] was necessary; also no ICU beds are      available there.  I also discussed this with Poison Control, who      recommended bicarbonate boluses if the QRS goes above 140.  We will      control his convulsions with benzodiazepine in the form of Ativan.      We will keep him mechanically ventilated for now.  His tachycardia      is likely a result of the tricyclic as well as cocaine.      Unfortunately, we cannot use a beta blocker.  We will try to      control his agitation and give him Ativan, and get his heart rate      under control.   Potassium will be replaced.  LFTs will be checked in the morning.  I  will check a CT of the head to make sure there is no other reason for  his seizure-type activity.   Poison Control also said not to use Dilantin if he continues to have  seizures.  They recommended using benzodiazepine only at this point.  They also recommended lidocaine if the patient has ventricular ectopies.  They did not recommend amiodarone because of possible QT prolongation.   We will check EKGs every 4 hours.  E-ICU will also help [place] manage this  patient overnight.   Total time in this encounter: 1hour.      Osvaldo Shipper, MD  Electronically Signed     GK/MEDQ  D:  09/22/2007  T:  09/23/2007  Job:  [id number]

## 2010-11-26 NOTE — Group Therapy Note (Signed)
NAME:  Jeremy Weeks, Jeremy Weeks               ACCOUNT NO.:  [id number]   MEDICAL RECORD NO.:  [id number]          PATIENT TYPE:  INP   LOCATION:  IC03                          FACILITY:  [facility]   PHYSICIAN:  Gardiner Barefoot, MD    DATE OF BIRTH:  April 26, 1961   DATE OF PROCEDURE:  09/26/2007  DATE OF DISCHARGE:                                 PROGRESS NOTE   SUBJECTIVE:  Over the past 24 hours the patient had no significant  events.  He does continue to complain of right-sided chest pain.  His  CPK was noted to be elevated yesterday.   OBJECTIVE:  VITAL SIGNS:  Pulse is 124, respirations are 20 to 30s,  blood pressure is 139/78 and O2 sat is 92% on nasal cannula.  GENERAL:  The patient is awake and alert, appears in mild respiratory  distress.  CARDIOVASCULAR:  Tachycardic with regular rhythm with no murmurs, rubs  or gallops.  LUNGS:  Clear to auscultation bilaterally, diminished breath sounds with  short inspirations.  ABDOMEN:  Soft, nontender, nondistended, positive bowel sounds and no  hepatosplenomegaly.  EXTREMITIES:  No clubbing, cyanosis or edema.   LABORATORY DATA:  Total CK is 3233, WBC is 20.6 with 86% neutrophils,  hemoglobin 12.5, platelets 111,000, sodium 136, potassium 3.6, chloride  107, bicarb 22, glucose 99, albumin 13, creatinine 1.05.  Blood cultures  no growth to date x2 days.  Chest x-ray with increased opacities  secondary to increased atelectasis.   ASSESSMENT AND PLAN:  1. Tricyclic antidepressant overdose.  Patient will continue to have a      Academic librarian and will need further assessment when he is more      stable by Psychiatry.  2. Leukocytosis.  The patient seems to be responding to the current      therapy with vancomycin and Zosyn for likely hospital-acquired      pneumonia.  His leukocytosis has decreased although his chest x-ray      has worsened a bit, likely secondary to taking such short breaths.      Pulmonary has ordered incentive spirometry to  encourage the patient      to take bigger breaths.  Will continue to work on adequate pain      control to assure the patient is able to breathe more deeply.  3. Increased total CPK.  The patient's most recent CPK this morning is      3300, which is stable from yesterday's result.  The patient is      receiving 250 mL of normal saline hourly to treat his      rhabdomyolysis.  I will continue with that for the day and reassess      CPK in the morning and potentially be able to back off of fluids at      that time.  4. Thrombocytopenia.  The patient is off of Lovenox secondary to      decrease in platelets which are down to 111 today.  He does have      sequential compression device boots for deep vein thrombosis  prophylaxis.  5. Cocaine abuse.  The patient will need outpatient referral.     Gardiner Barefoot, MD  Electronically Signed    RWC/MEDQ  D:  09/26/2007  T:  09/26/2007  Job:  [phone]

## 2010-11-26 NOTE — Group Therapy Note (Signed)
NAME:  Jeremy Weeks, Jeremy Weeks               ACCOUNT NO.:  [id number]   MEDICAL RECORD NO.:  [id number]          PATIENT TYPE:  INP   LOCATION:  A228                          FACILITY:  [facility]   PHYSICIAN:  Skeet Latch, DO    DATE OF BIRTH:  09-07-1960   DATE OF PROCEDURE:  DATE OF DISCHARGE:                                 PROGRESS NOTE   SUBJECTIVE:  Jeremy Weeks states that he feels a little bit better.  The  patient still continues to take deep breaths with pain medication and  states that he continues to have lots of productive sputum.  The patient  denies any fever, chills, overall seems to be more ambulatory and is  slowly improving.   OBJECTIVE:  VITAL SIGNS:  Temperature is 99.1, pulse 116, respirations  32, blood pressure 125/73, satting 93% on 2 liters.  CARDIOVASCULAR:  He is tachycardiac, S1-S2 is regular.  No murmurs,  rubs, gallops.  LUNGS:  Still some decreased breath sounds, right  greater than left.  No major wheezing or rhonchi.  ABDOMEN:  Soft, nontender, nondistended.  Positive bowel sounds.  EXTREMITIES:  No clubbing, cyanosis or edema noted.   LABORATORY DATA:  Troponin 0.01, CK-MB 0.4, total creatinine kinase 200,  sodium 134, potassium 3.7, chloride 98, CO2 28, glucose 112, BUN 10,  creatinine 0.75, white count 13.4, hemoglobin 12.3, hematocrit 34.9,  platelets 212.  So far his blood cultures are negative.  His respiratory  culture did grow gram-negative rods with E. coli.   ASSESSMENT/PLAN:  1. Acute respiratory failure, resolved.  The patient's shortness of      breath is improving.  He will continue with nasal cannula.  2. Pneumonia likely aspiration.  The patient is on IV antibiotics.  He      did have a positive  respiratory culture.  He is being followed by      pulmonology.  3. Suicide/drug overdose.  He was seen by Act Team.  The patient did      sign a release stating he will have outpatient treatment.  The      patient seems to be doing well at this  time.  4.  Nicotine abuse.      Will continue the patch at this time.  4. Hyponatremia that has resolved.  5. Hypokalemia that has also resolved.   DISPOSITION:  Anticipate hopefully the patient being discharged in the  next few days if he continues to improve.  We will also get a chest x-  ray to see if there is any improvement to his lungs.     Skeet Latch, DO  Electronically Signed    SM/MEDQ  D:  09/30/2007  T:  09/30/2007  Job:  [id number]

## 2010-11-26 NOTE — Group Therapy Note (Signed)
NAME:  Jeremy Weeks, Jeremy Weeks               ACCOUNT NO.:  [id number]   MEDICAL RECORD NO.:  [id number]          PATIENT TYPE:  INP   LOCATION:  IC03                          FACILITY:  [facility]   PHYSICIAN:  Edward L. Juanetta Gosling, M.D.DATE OF BIRTH:  Mar 28, 1961   DATE OF PROCEDURE:  DATE OF DISCHARGE:                                 PROGRESS NOTE   Mr. Narula is overall I think better.  He is coming off of sedation and  so far has done well.  He does have a lot of secretions.  He remains  intubated and ventilated but we are trying to wean him this morning.  His lab work this morning, blood gas on 40% 600/14, 5 at peak, pH 7.36,  pCO2 of 42, pO2 of 104, B met BUN is 13, creatinine 0.93.  Electrolytes  are normal and his CBC shows white count 8000, hemoglobin 14, platelets  128. Chest x-ray shows no obvious infiltrate.      Edward L. Juanetta Gosling, M.D.  Electronically Signed     ELH/MEDQ  D:  09/24/2007  T:  09/25/2007  Job:  [id number]

## 2010-11-26 NOTE — Consult Note (Signed)
NAME:  Jeremy Weeks, Jeremy Weeks               ACCOUNT NO.:  [id number]   MEDICAL RECORD NO.:  [id number]          PATIENT TYPE:  INP   LOCATION:  IC03                          FACILITY:  [facility]   PHYSICIAN:  Edward L. Juanetta Gosling, M.D.DATE OF BIRTH:  09-20-1960   DATE OF CONSULTATION:  09/23/2007  DATE OF DISCHARGE:                                 CONSULTATION   REASON FOR CONSULTATION:  Respiratory failure.   HISTORY:  Mr. Brase is a 50 year old who was in his usual state of fair  health complicated by chronic back pain and hypothyroidism when he got  into some sort of an argument with his girlfriend and then took an  unknown amount of unknown medications.  His girlfriend says these were  amitriptyline tablets.  He is intubated, sedated, and really not able to  give any history now.   MEDICATIONS AT HOME:  1. Synthroid 115 mcg daily.  2. Flexeril 10 mg as needed.  3. Lortab 7.5 as needed.  4. There is some question about the amitriptyline.   PAST MEDICAL HISTORY:  1. Chronic back pain.  2. Degenerative disc disease.  3. Hypothyroidism.  4. He has had a cholecystectomy.   SOCIAL HISTORY:  He lives with his girlfriend.  He does smoke about 2-3  packages of cigarettes daily.  He does not use any alcohol.  He had  apparently quit his job, and has a history of illicit drug abuse with  cocaine.   FAMILY HISTORY:  Noncontributory. His family history is unknown  otherwise.   REVIEW OF SYSTEMS:  Pretty much unavailable.   PHYSICAL EXAMINATION:  GENERAL/VITAL SIGNS:  Shows that he is intubated  on the ventilator, heart rate in the 90s.  It was much higher earlier.  His blood pressure is in the 120s.  He still has some myoclonic jerks,  more on the left on than the right.  HEENT:  Pupils are reactive.  NECK:  Supple.  CHEST:  Fairly clear, with some rhonchi.  HEART:  Regular, without gallops.  ABDOMEN:  Soft.  EXTREMITIES:  Showed no edema.  CNS:  Shows that he is sedated.   Urine drug  screen positive for the benzodiazepines, cocaine.  Alcohol  level is low.  Chest x-ray shows no changes.  Hs EKG shows sinus  tachycardia.  QRS interval is going to be followed.  He is Ativan, and  are waiting to see how he does.  I think it is reasonable to see if he  is able to come off the ventilator, but I do not expect him to be able  to be extubated today.      Edward L. Juanetta Gosling, M.D.  Electronically Signed     ELH/MEDQ  D:  09/23/2007  T:  09/24/2007  Job:  [id number]

## 2010-11-26 NOTE — Group Therapy Note (Signed)
NAME:  Jeremy Weeks, Jeremy Weeks               ACCOUNT NO.:  [id number]   MEDICAL RECORD NO.:  [id number]          PATIENT TYPE:  INP   LOCATION:  A225                          FACILITY:  [facility]   PHYSICIAN:  Margaretmary Dys, M.D.DATE OF BIRTH:  [birth year]   DATE OF PROCEDURE:  09/28/2007  DATE OF DISCHARGE:                                 PROGRESS NOTE   SUBJECTIVE:  The patient feels much better today.  He says he is less  short of breath.  The patient has actually been ambulated when I saw  him, and he was doing very well.  He has no fevers or chills.  His oral  intake has improved.  Discussed with Tom, the ACT nurse, who said the  patient did not attempt to commit suicide.  As a result, we will be  discontinuing his suicide watch.   OBJECTIVE:  Conscious, alert, comfortable, not in acute distress.  Blood pressure is 128/68 with a pulse of 98, respiratory rate of 20,  temperature was 98.5  HEENT EXAM:  Normocephalic, atraumatic.  Oral mucosa was moist with no  exudates.  NECK:  Supple, no JVD, no lymphadenopathy.  LUNGS:  Clear to auscultation, no crackles at the bases.  HEART:  S1, S2 regular, no S3, S4, gallops or rubs.  ABDOMEN:  Soft, nontender, bowel sounds positive, no masses palpable.  EXTREMITIES:  Trace pretibial edema with no calf induration or  tenderness noted.   LABORATORY/DIAGNOSTIC DATA:  Blood gas this morning showed a pH of  7.399, pCO2 was 38.5, pO2 was 63.3, bicarbonate of 23.3.  Oxygen  saturation was 93%.  White blood cell count was 10.8, hemoglobin of  12.6, hematocrit 35.4, platelet count of 135.  Neutrophils were 75%.  Sodium 136, potassium 3.3, chloride of 103, CO2 is 26, glucose 98.  BUN  of 9, creatinine was 0.83, creatinine kinase is now down to 887,  triglycerides are 121.   ASSESSMENT/PLAN:  1. Acute respiratory failure requiring mechanical ventilation; status      post extubation.  The patient is improving slowly.  His hypoxemia      has improved from  the blood gas this morning.  His chest x-ray will      be obtained in the morning.  The patient is also ambulating well      without difficulty.  2. Escherichia coli pneumonia likely secondary to aspiration.  The      Escherichiacoli is sensitive to all the antibiotics tested.  Will      continue only on Zosyn for now.  The patient's white count has      improved, and he has no fever.  3. Acute rhabdomyolysis likely secondary to cocaine use.  CPK levels      were improving.  We will decrease his intravenous fluids to prevent      pulmonary edema.  4. Suicide/drug overdose .  The patient was evaluated by the ACT team.      It appears that there is no concern for suicidal ideation, and the      patient is not severely depressed.  As a  result, we have      discontinued his suicide watch.  Will continue to observe him on      the telemetry floor.  He will be transferred out to the telemetry      floor.  5. Thrombocytopenia.  The patient informs me that he has not had      alcohol in years, and he does not have any alcohol problems.  The      patient's platelet count today has improved to about 135.  6. Chronic nicotine abuse.  The patient is currently on nicotine patch      and appears to be doing  fairly well.  He denies any craving.   DISPOSITION:  The patient will be transferred out of the intensive care  unit today.   Will maintain current antibiotic therapy for his E. Coli pneumonia.  Would decrease his IV fluids.  We will encourage ambulation.      Margaretmary Dys, M.D.  Electronically Signed     AM/MEDQ  D:  09/28/2007  T:  09/28/2007  Job:  [id number]

## 2010-11-26 NOTE — Group Therapy Note (Signed)
NAME:  Jeremy Weeks, Jeremy Weeks               ACCOUNT NO.:  [id number]   MEDICAL RECORD NO.:  [id number]          PATIENT TYPE:  INP   LOCATION:  IC03                          FACILITY:  [facility]   PHYSICIAN:  Gardiner Barefoot, MD    DATE OF BIRTH:  Jan 13, 1961   DATE OF PROCEDURE:  09/25/2007  DATE OF DISCHARGE:                                 PROGRESS NOTE   PROGRESS NOTE:  Events over the past 24 to 48 hours noted. The patient  has been extubated yesterday, however, developed significant fever up to  103 yesterday, as well as hypertension and has had increased cough with  productive sputum. The patient reports right sided pain, pleuritic  particularly with coughing.   OBJECTIVE:  VITAL SIGNS:  Temperature max was 103.1. Temperature current  is 98.7. Heart rate 104 to 120. Blood pressure over the last 12 hours  has been 80's to 90's systolic over 50's to 60's. Respiratory rate has  been 17 to 25. O2 sat 98% to 100% on 3 liters nasal cannula.  GENERAL:  Awake and alert. Drowsy and appears in mild distress,  respiratory.  HEENT:  Anicteric and nasal cannula in place.  CARDIOVASCULAR:  Tachycardiac with regular rhythm. No murmur, rub, or  gallop.  LUNGS:  Sounds clear to auscultation bilaterally. No crackles  appreciated.  ABDOMEN:  Soft, nontender, and nondistended. Positive bowel sounds. No  hepatosplenomegaly.  EXTREMITIES:  No clubbing, cyanosis, or edema.   LABORATORY DATA:  Significant for white blood cell count of 25.8, 25%  bands, 60% neutrophils. Creatinine 1.43, which is increased from 0.93.  Total CK of 1,672, which is up from 85. Blood cultures x2 from September 24, 2007, no growth to date.   ASSESSMENT/PLAN:  1. Leukocytosis with fever. The patient likely has a hospital acquired      pneumonia and has been started on vancomycin and Zosyn, dosed by      pharmacy. Will check a chest x-ray this a.m. for questionable      hospital acquired pneumonia. There may be an element of sepsis as      well with renal failure.  2. Increased CPK 1,672.  This may be rhabdomyolysis and will increase      his fluid intake.  3. Cocaine abuse. The patient will need referral as an outpatient.  4. Thrombocytopenia. The patient is off Lovenox at this time and on      compression devices.  5. Chronic jerks. The patient is followed by neurology.      Gardiner Barefoot, MD  Electronically Signed     RWC/MEDQ  D:  09/25/2007  T:  09/25/2007  Job:  [id number]

## 2010-11-26 NOTE — Group Therapy Note (Signed)
NAME:  Jeremy Weeks, Jeremy Weeks               ACCOUNT NO.:  [id number]   MEDICAL RECORD NO.:  [id number]          PATIENT TYPE:  INP   LOCATION:  IC03                          FACILITY:  [facility]   PHYSICIAN:  Edward L. Juanetta Gosling, M.D.DATE OF BIRTH:  1960/07/21   DATE OF PROCEDURE:  09/25/2007  DATE OF DISCHARGE:                                 PROGRESS NOTE   SUBJECTIVE:  Mr. Thomason is doing better.  He was extubated yesterday.  He is awake and alert but he has had several metabolic findings recently  including his white blood count has gone up significantly.  He has had  an elevation of CK suggesting that he has had some rhabdomyolysis I  suspect from the cocaine use that was documented by his urine culture.  Otherwise he is doing fairly well.  He is awake and alert.  He says he  has got some right-sided chest pain.  Otherwise no complaints.   His physical examination shows as mentioned he is awake and alert.  His  blood pressure 100/70, pulse 100.  His chest is relatively clear with  some rhonchi.  His heart is regular now without gallop and with minimal  if any tachycardia now.   His lab work this morning CK is 1672 as mentioned suggesting that he has  had a rhabdomyolysis.  It was 77 yesterday so certainly I would have  expected that earlier on in the course.  His BUN and creatinine are  still okay of 18 and 1.43.  CBC shows white count 25,800, hemoglobin is  13.2, platelets 131.  Chest x-ray today is pending.   ASSESSMENT:  He very well may have aspirated and he was unconscious  earlier.  He is on antibiotics now.  He is afebrile so I think things  are going fairly well.  He has got orders to follow a CPK and he is  going to continue with everything else, antibiotics, etc. and follow.  I  did order a chest x-ray for in the morning as well.      Edward L. Juanetta Gosling, M.D.  Electronically Signed     ELH/MEDQ  D:  09/25/2007  T:  09/26/2007  Job:  [id number]
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
## 2011-03-09 ENCOUNTER — Encounter: Payer: Self-pay | Admitting: Emergency Medicine

## 2011-03-09 ENCOUNTER — Emergency Department (HOSPITAL_COMMUNITY)
Admission: EM | Admit: 2011-03-09 | Discharge: 2011-03-09 | Disposition: A | Discharge status: Home / Self Care | Payer: Self-pay | Attending: Emergency Medicine | Admitting: Emergency Medicine

## 2011-03-09 DIAGNOSIS — G8929 Other chronic pain: Secondary | ICD-10-CM | POA: Insufficient documentation

## 2011-03-09 DIAGNOSIS — M79609 Pain in unspecified limb: Secondary | ICD-10-CM | POA: Insufficient documentation

## 2011-03-09 DIAGNOSIS — E079 Disorder of thyroid, unspecified: Secondary | ICD-10-CM | POA: Insufficient documentation

## 2011-03-09 DIAGNOSIS — Z885 Allergy status to narcotic agent status: Secondary | ICD-10-CM | POA: Insufficient documentation

## 2011-03-09 MED ORDER — DIAZEPAM 5 MG PO TABS
10.0000 mg | ORAL_TABLET | Freq: Once | ORAL | Status: AC
  Administered 2011-03-09: 10 mg via ORAL
  Filled 2011-03-09: qty 2

## 2011-03-09 MED ORDER — CYCLOBENZAPRINE HCL 10 MG PO TABS: 10.0000 mg | ORAL_TABLET | Freq: Three times a day (TID) | ORAL | Status: AC | PRN

## 2011-03-09 MED ORDER — FAMOTIDINE 20 MG PO TABS: ORAL_TABLET | ORAL | Status: DC

## 2011-03-09 MED ORDER — PREDNISONE 10 MG PO TABS: ORAL_TABLET | ORAL | Status: DC

## 2011-03-09 MED ORDER — DEXAMETHASONE SODIUM PHOSPHATE 4 MG/ML IJ SOLN
12.0000 mg | Freq: Once | INTRAMUSCULAR | Status: AC
  Administered 2011-03-09: 12 mg via INTRAMUSCULAR
  Filled 2011-03-09: qty 3

## 2011-03-09 MED ORDER — TRAMADOL-ACETAMINOPHEN 37.5-325 MG PO TABS: 2.0000 | ORAL_TABLET | Freq: Four times a day (QID) | ORAL | Status: AC | PRN

## 2011-03-09 NOTE — ED Notes (Signed)
Pt was fishing fell down bank and now left hip and leg hurts

## 2011-03-09 NOTE — ED Provider Notes (Cosign Needed)
History     CSN: [id number] Arrival date & time: 03/09/2011  6:42 AM  Chief Complaint  Patient presents with  . Leg Pain    Fell  3 days ago while fishing   Patient is a 50 y.o. male presenting with leg pain.  Leg Pain   HPI Jeremy Weeks is a 50 y.o. male who presents to the Emergency Department complaining of burning Leg pain starting Friday 03/07/2011. Patient localizes pain to his left hip and states that pain radiates down to his left toes. He reports, [place] when he slid down and "jammed" his leg on a root. Patient denies back pain but reports tobacco use. He reports similar symptoms with a history of sciatica nerve problems. He states he was told he needed surgery for a herniated disc in L4 L5 several years ago in [place]. Patient has been seen multiple times in the ER for the same leg pain and has been referred to Dr Romeo Apple, which he has not done yet. There are no other associated symptoms and no other alleviating or aggravating factors.   HPI ELEMENTS:  Location: Left hip  Onset: Friday 03/07/2011 Duration: 2 days  Quality: burning  Context:  as above  Associated symptoms: as above    Past Medical History  Diagnosis Date  . Thyroid disease    MEDICATIONS:  Previous Medications   BUPROPION (WELLBUTRIN) 100 MG TABLET    Take 100 mg by mouth 2 (two) times daily.     DIVALPROEX (DEPAKOTE) 500 MG EC TABLET    Take 1,500 mg by mouth 1 day or 1 dose.     LEVOTHYROXINE (SYNTHROID, LEVOTHROID) 175 MCG TABLET    Take 175 mcg by mouth daily.       ALLERGIES:  Allergies as of 03/09/2011 - never reviewed  Allergen Reaction Noted  . Codeine Rash 03/09/2011      Past Surgical History  Procedure Date  . Cholecystectomy     History reviewed. No pertinent family history.  History  Substance Use Topics  . Smoking status: Current Everyday Smoker -- 1.0 packs/day for 37 years    Types: Cigarettes  . Smokeless tobacco: Not on file  . Alcohol  Use: No      Review of Systems  All other systems reviewed and are negative.    Physical Exam  BP 136/80  Pulse 71  Temp(Src) 97.7 F (36.5 C) (Oral)  Resp 16  Ht 6' (1.829 m)  Wt 215 lb (97.523 kg)  BMI 29.16 kg/m2  Physical Exam  Constitutional: Vital signs are normal. He appears well-developed and well-nourished.       PT wiggliing his left leg  HENT:  Head: Normocephalic and atraumatic.  Nose: Nose normal.  Mouth/Throat: Uvula is midline, oropharynx is clear and moist and mucous membranes are normal.       edentulous  Eyes: Conjunctivae and EOM are normal. Pupils are equal, round, and reactive to light.  Cardiovascular: Regular rhythm, S1 normal, S2 normal and normal heart sounds.   Pulmonary/Chest: Effort normal.       Pt has rare late end expir wheeze/rhonchi  Musculoskeletal:       Pt is nontender in his thoracic and lumbar spine. Has no pain in his back with SLR. Patellar reflexes +1 and = bilaterally. No numbness, no pain in his actual hip on SLR. Pt has some tenderness in his paraspinous muscles in his left lumbar spine.   Neurological: He is  alert. He has normal strength. No cranial nerve deficit or sensory deficit.  Skin: Skin is warm, dry and intact. No abrasion noted.  Psychiatric: His speech is normal and behavior is normal. Thought content normal. His mood appears anxious.    ED Course  Procedures   Wife reports he has chronic pain like he is having today, Pt tried to imply he had a back problem several years ago in [place] and had this flare-up with a fall.   MDM [facility] site reviewed. Pt has gotten oxycodone from Dr Betti Cruz, Psych in July as well as clonazepam, but not in past 2 months.  Pt has 5-7 ED visits in past 3 years for this same c/o pain in his left hip usually after falling.    Pt given IM decadron and oral valium.     Ward Givens, MD 03/09/11 [phone]  I personally performed the services described in this documentation, which was scribed in  my presence. The recorded information has been reviewed and considered. Ward Givens, MD   Ward Givens, MD 03/09/11 [phone]

## 2011-03-23 ENCOUNTER — Emergency Department (HOSPITAL_COMMUNITY): Payer: Self-pay

## 2011-03-23 ENCOUNTER — Encounter (HOSPITAL_COMMUNITY): Payer: Self-pay

## 2011-03-23 ENCOUNTER — Emergency Department (HOSPITAL_COMMUNITY)
Admission: EM | Admit: 2011-03-23 | Discharge: 2011-03-23 | Disposition: A | Discharge status: Home / Self Care | Payer: Self-pay | Attending: Emergency Medicine | Admitting: Emergency Medicine

## 2011-03-23 DIAGNOSIS — L02519 Cutaneous abscess of unspecified hand: Secondary | ICD-10-CM | POA: Insufficient documentation

## 2011-03-23 DIAGNOSIS — L0291 Cutaneous abscess, unspecified: Secondary | ICD-10-CM

## 2011-03-23 DIAGNOSIS — L03019 Cellulitis of unspecified finger: Secondary | ICD-10-CM | POA: Insufficient documentation

## 2011-03-23 MED ORDER — HYDROCODONE-ACETAMINOPHEN 5-325 MG PO TABS: 1.0000 | ORAL_TABLET | ORAL | Status: AC | PRN

## 2011-03-23 MED ORDER — DOXYCYCLINE HYCLATE 100 MG PO TABS
100.0000 mg | ORAL_TABLET | Freq: Once | ORAL | Status: AC
  Administered 2011-03-23: 100 mg via ORAL
  Filled 2011-03-23: qty 1

## 2011-03-23 MED ORDER — BUPIVACAINE HCL (PF) 0.5 % IJ SOLN: 10.0000 mL | Freq: Once | INTRAMUSCULAR | Status: AC

## 2011-03-23 MED ORDER — BUPIVACAINE HCL (PF) 0.5 % IJ SOLN
INTRAMUSCULAR | Status: AC
  Administered 2011-03-23: 17:00:00
  Filled 2011-03-23: qty 30

## 2011-03-23 MED ORDER — DOXYCYCLINE HYCLATE 100 MG PO CAPS: 100.0000 mg | ORAL_CAPSULE | Freq: Two times a day (BID) | ORAL | Status: AC

## 2011-03-23 MED ORDER — HYDROCODONE-ACETAMINOPHEN 5-325 MG PO TABS
1.0000 | ORAL_TABLET | Freq: Once | ORAL | Status: AC
  Filled 2011-03-23: qty 1

## 2011-03-23 NOTE — ED Notes (Signed)
Pt states "got a piece of wood in lt thumb on Tuesday at work". Tried to get wood out by slicing end of thumb with pocket knife after he seen infection started.  Lt thumb is swollen.

## 2011-03-23 NOTE — ED Notes (Signed)
Pt presents with left thumb swelling after attempting to remove what pt thought was a splinter from thumb with a utility knife on Tuesday. Pad of thumb visibly swollen. No drainage noted. Pt denies using OTC medication.

## 2011-03-23 NOTE — ED Notes (Signed)
Pt's thumb cleaned and dressed with sterile dressing. Pt denies pain at this time.

## 2011-03-26 NOTE — ED Provider Notes (Signed)
Medical screening examination/treatment/procedure(s) were performed by non-physician practitioner and as supervising physician I was immediately available for consultation/collaboration.  Jaylon Boylen, MD 03/26/11 2014 

## 2011-03-26 NOTE — ED Provider Notes (Signed)
History     CSN: [id number] Arrival date & time: 03/23/2011  4:08 PM  Chief Complaint  Patient presents with  . Joint Swelling  . Finger Injury   Patient is a 50 y.o. male presenting with abscess. The history is provided by the patient.  Abscess  This is a new problem. The current episode started less than one week ago. The problem has been gradually worsening (He had a probable wood splinter in his left thumb 5 days ago at work  and after removing it,  has started to swell and become more painful.  Believes there may be retained wood still.). The abscess is present on the left fingers. The problem is moderate. The abscess is characterized by swelling and painfulness. The abscess first occurred at work. Pertinent negatives include no fever, no congestion and no sore throat.    Past Medical History  Diagnosis Date  . Thyroid disease     Past Surgical History  Procedure Date  . Cholecystectomy     History reviewed. No pertinent family history.  History  Substance Use Topics  . Smoking status: Current Everyday Smoker -- 1.0 packs/day for 37 years    Types: Cigarettes  . Smokeless tobacco: Not on file  . Alcohol Use: No      Review of Systems  Constitutional: Negative for fever.  HENT: Negative for congestion, sore throat and neck pain.   Eyes: Negative.   Respiratory: Negative for chest tightness and shortness of breath.   Cardiovascular: Negative for chest pain.  Gastrointestinal: Negative for nausea and abdominal pain.  Genitourinary: Negative.   Musculoskeletal: Negative for joint swelling and arthralgias.  Skin: Negative.  Negative for rash and wound.  Neurological: Negative for dizziness, weakness, light-headedness, numbness and headaches.  Hematological: Negative.   Psychiatric/Behavioral: Negative.     Physical Exam  BP 132/79  Pulse 67  Temp(Src) 97.8 F (36.6 C) (Oral)  Resp 20  Ht 6' (1.829 m)  Wt 215 lb (97.523 kg)  BMI 29.16 kg/m2  SpO2  98%  Physical Exam  Nursing note and vitals reviewed. Constitutional: He is oriented to person, place, and time. He appears well-developed and well-nourished.  HENT:  Head: Normocephalic and atraumatic.  Eyes: Conjunctivae are normal.  Neck: Normal range of motion.  Cardiovascular: Normal rate, regular rhythm, normal heart sounds and intact distal pulses.   Pulmonary/Chest: Effort normal and breath sounds normal. He has no wheezes.  Abdominal: Soft. Bowel sounds are normal. There is no tenderness.  Musculoskeletal: Normal range of motion.  Neurological: He is alert and oriented to person, place, and time.  Skin: Skin is warm and dry.     Psychiatric: He has a normal mood and affect.    ED Course  INCISION AND DRAINAGE Performed by: Jess Toney L Authorized by: Burgess Amor L Consent: Verbal consent obtained. Risks and benefits: risks, benefits and alternatives were discussed Consent given by: patient Patient understanding: patient states understanding of the procedure being performed Patient identity confirmed: verbally with patient Type: abscess Body area: upper extremity Location details: left thumb Anesthesia: digital block Local anesthetic: lidocaine 2% without epinephrine Scalpel size: 11 Incision type: single straight Complexity: simple Drainage: bloody Drainage amount: scant Wound treatment: wound left open Patient tolerance: Patient tolerated the procedure well with no immediate complications. Comments: Wound probed,  No loculations,  No fb's appreciated.    MDM Abscess.      Candis Musa, PA 03/26/11 [phone]

## 2011-04-07 LAB — BASIC METABOLIC PANEL
BUN: 18
BUN: 10
BUN: 11
BUN: 13
BUN: 9
CO2: 22
CO2: 24
CO2: 24
CO2: 25
CO2: 28
CO2: 29
Calcium: 8.1 — ABNORMAL LOW
Calcium: 8.5
Calcium: 8.7
Chloride: 106
Chloride: 107
Chloride: 107
Chloride: 96
Chloride: 98
Creatinine, Ser: 0.75
Creatinine, Ser: 0.83
GFR calc Af Amer: >60
GFR calc Af Amer: >60
GFR calc Af Amer: >60
GFR calc non Af Amer: 53 — ABNORMAL LOW
GFR calc non Af Amer: >60
GFR calc non Af Amer: >60
GFR calc non Af Amer: >60
Glucose, Bld: 120 — ABNORMAL HIGH
Glucose, Bld: 110 — ABNORMAL HIGH
Glucose, Bld: 112 — ABNORMAL HIGH
Glucose, Bld: 98
Potassium: 3.4 — ABNORMAL LOW
Potassium: 4.4
Potassium: 3.1 — ABNORMAL LOW
Potassium: 3.6
Potassium: 3.6
Potassium: 3.6
Potassium: 3.7
Sodium: 139
Sodium: 140
Sodium: 133 — ABNORMAL LOW
Sodium: 135
Sodium: 137

## 2011-04-07 LAB — CBC
HCT: 37.2 — ABNORMAL LOW
HCT: 45.9
HCT: 34.9 — ABNORMAL LOW
HCT: 36 — ABNORMAL LOW
HCT: 36.3 — ABNORMAL LOW
Hemoglobin: 13.2
Hemoglobin: 16.2
Hemoglobin: 12.3 — ABNORMAL LOW
Hemoglobin: 12.7 — ABNORMAL LOW
Hemoglobin: 12.8 — ABNORMAL LOW
MCHC: 34.8
MCHC: 35.5
MCHC: 35.5
MCHC: 33.6
MCHC: 36
MCV: 92.4
MCV: 92.5
MCV: 93.1
MCV: 90.9
Platelets: 128 — ABNORMAL LOW
Platelets: 131 — ABNORMAL LOW
Platelets: 163
Platelets: 111 — ABNORMAL LOW
Platelets: 135 — ABNORMAL LOW
Platelets: 212
RBC: 4.02 — ABNORMAL LOW
RBC: 4.97
RBC: 3.76 — ABNORMAL LOW
RBC: 3.82 — ABNORMAL LOW
RBC: 3.88 — ABNORMAL LOW
RBC: 3.96 — ABNORMAL LOW
RDW: 13
RDW: 13.4
RDW: 13.2
RDW: 13.5
WBC: 25.8 — ABNORMAL HIGH
WBC: 8
WBC: 8.4
WBC: 10.8 — ABNORMAL HIGH
WBC: 13.4 — ABNORMAL HIGH
WBC: 14.8 — ABNORMAL HIGH
WBC: 14.9 — ABNORMAL HIGH
WBC: 20.6 — ABNORMAL HIGH

## 2011-04-07 LAB — CULTURE, BLOOD (ROUTINE X 2)
Culture: NO GROWTH
Culture: NO GROWTH
Report Status: 3182009
Report Status: 3182009

## 2011-04-07 LAB — CARDIAC PANEL(CRET KIN+CKTOT+MB+TROPI)
CK, MB: 0.6
CK, MB: 3.9
CK, MB: 4.9 — ABNORMAL HIGH
Relative Index: 0.1
Relative Index: 0.2
Relative Index: INVALID
Relative Index: 0.1
Relative Index: 0.2
Total CK: 1672 — ABNORMAL HIGH
Total CK: 3553 — ABNORMAL HIGH
Total CK: 96
Total CK: 3233 — ABNORMAL HIGH
Troponin I: 0.01
Troponin I: 0.04

## 2011-04-07 LAB — DIFFERENTIAL
Basophils Absolute: 0
Basophils Absolute: 0
Basophils Absolute: 0.1
Basophils Relative: 1
Basophils Relative: 0
Basophils Relative: 1
Blasts: 0
Eosinophils Absolute: 0
Eosinophils Absolute: 0
Eosinophils Absolute: 0.1
Eosinophils Absolute: 0
Eosinophils Relative: 0
Eosinophils Relative: 2
Eosinophils Relative: 1
Eosinophils Relative: 2
Lymphocytes Relative: 21
Lymphocytes Relative: 45
Lymphocytes Relative: 10 — ABNORMAL LOW
Lymphocytes Relative: 11 — ABNORMAL LOW
Lymphocytes Relative: 11 — ABNORMAL LOW
Lymphocytes Relative: 15
Lymphocytes Relative: 17
Lymphocytes Relative: 9 — ABNORMAL LOW
Lymphs Abs: 2.4
Lymphs Abs: 3.8
Lymphs Abs: 1.2
Lymphs Abs: 1.7
Lymphs Abs: 1.9
Lymphs Abs: 2
Monocytes Absolute: 0.7
Monocytes Absolute: 1.8 — ABNORMAL HIGH
Monocytes Absolute: 1.2 — ABNORMAL HIGH
Monocytes Absolute: 1.4 — ABNORMAL HIGH
Monocytes Absolute: 1.5 — ABNORMAL HIGH
Monocytes Relative: 6
Monocytes Relative: 7
Monocytes Relative: 12
Monocytes Relative: 12
Monocytes Relative: 6
Neutro Abs: 15.5 — ABNORMAL HIGH
Neutro Abs: 10.7 — ABNORMAL HIGH
Neutro Abs: 17.7 — ABNORMAL HIGH
Neutro Abs: 8.1 — ABNORMAL HIGH
Neutro Abs: 9 — ABNORMAL HIGH
Neutrophils Relative %: 54
Neutrophils Relative %: 60
Neutrophils Relative %: 72
Neutrophils Relative %: 72
Neutrophils Relative %: 82 — ABNORMAL HIGH
Neutrophils Relative %: 86 — ABNORMAL HIGH
WBC Morphology: INCREASED
nRBC: 0

## 2011-04-07 LAB — CK TOTAL AND CKMB (NOT AT ARMC)
CK, MB: 1
Total CK: 102

## 2011-04-07 LAB — BLOOD GAS, ARTERIAL
Acid-base deficit: 0.4
Acid-base deficit: 0.7
Acid-base deficit: 1.1
Acid-base deficit: 1.3
Acid-base deficit: 1.4
Bicarbonate: 22.4
Bicarbonate: 23.1
Bicarbonate: 23.2
Bicarbonate: 20.9
FIO2: 0.4
FIO2: 40
MECHVT: 600
MECHVT: 650
Mode: POSITIVE
O2 Content: 3
O2 Content: 2
O2 Saturation: 95.6
O2 Saturation: 97.4
O2 Saturation: 97.8
O2 Saturation: 98.3
PEEP: 5
PEEP: 5
Patient temperature: 37
Patient temperature: 37
Patient temperature: 37
Patient temperature: 37
Patient temperature: 37
Pressure support: 5
RATE: 14
RATE: 14
TCO2: 19.1
TCO2: 20.3
TCO2: 20.9
pCO2 arterial: 30.2 — ABNORMAL LOW
pCO2 arterial: 35
pCO2 arterial: 39.9
pCO2 arterial: 31.6 — ABNORMAL LOW
pCO2 arterial: 38.5
pH, Arterial: 7.381
pH, Arterial: 7.438
pH, Arterial: 7.483 — ABNORMAL HIGH
pH, Arterial: 7.399
pO2, Arterial: 104 — ABNORMAL HIGH
pO2, Arterial: 68.8 — ABNORMAL LOW
pO2, Arterial: 84.7
pO2, Arterial: 92.4
pO2, Arterial: 59.5 — ABNORMAL LOW

## 2011-04-07 LAB — CULTURE, RESPIRATORY W GRAM STAIN

## 2011-04-07 LAB — URINE CULTURE
Colony Count: NO GROWTH
Culture: NO GROWTH
Special Requests: NEGATIVE

## 2011-04-07 LAB — SALICYLATE LEVEL: Salicylate Lvl: <4

## 2011-04-07 LAB — ETHANOL: Alcohol, Ethyl (B): <5

## 2011-04-07 LAB — HEPATIC FUNCTION PANEL
ALT: 26
AST: 61 — ABNORMAL HIGH
Bilirubin, Direct: 0.5 — ABNORMAL HIGH
Indirect Bilirubin: 0.7
Total Protein: 6.2

## 2011-04-07 LAB — BASIC METABOLIC PANEL WITH GFR
BUN: 13
CO2: 25
Calcium: 8.8
Chloride: 111
Creatinine, Ser: 0.93
GFR calc non Af Amer: >60
Glucose, Bld: 91
Potassium: 4.2
Sodium: 140

## 2011-04-07 LAB — T3, FREE: T3, Free: 2.2 — ABNORMAL LOW (ref 2.3–4.2)

## 2011-04-07 LAB — RAPID URINE DRUG SCREEN, HOSP PERFORMED
Cocaine: POSITIVE — AB
Opiates: NOT DETECTED
Tetrahydrocannabinol: NOT DETECTED

## 2011-04-07 LAB — COMPREHENSIVE METABOLIC PANEL
ALT: 28
Calcium: 8.9
Creatinine, Ser: 0.99
GFR calc Af Amer: >60
GFR calc non Af Amer: >60
Glucose, Bld: 123 — ABNORMAL HIGH
Sodium: 141
Total Protein: 6.6

## 2011-04-07 LAB — VANCOMYCIN, TROUGH: Vancomycin Tr: 13.3

## 2011-04-07 LAB — TRIGLYCERIDES
Triglycerides: 83
Triglycerides: 105
Triglycerides: 121

## 2011-04-07 LAB — URINALYSIS, ROUTINE W REFLEX MICROSCOPIC
Bilirubin Urine: NEGATIVE
Nitrite: NEGATIVE
Specific Gravity, Urine: >1.03 — ABNORMAL HIGH
pH: 5

## 2011-04-07 LAB — CK: Total CK: 887 — ABNORMAL HIGH

## 2011-04-07 LAB — TSH: TSH: 2.185

## 2011-04-07 LAB — ACETAMINOPHEN LEVEL: Acetaminophen (Tylenol), Serum: <10 — ABNORMAL LOW

## 2011-04-07 LAB — T3 UPTAKE: T3 Uptake Ratio: 24.2

## 2011-04-07 LAB — APTT: aPTT: 35

## 2011-04-07 LAB — PROTIME-INR: Prothrombin Time: 14

## 2011-04-07 LAB — T4: T4, Total: 13.3 — ABNORMAL HIGH

## 2011-04-08 LAB — BASIC METABOLIC PANEL
BUN: 15
CO2: 25
Calcium: 9.4
Chloride: 100
Creatinine, Ser: 0.78
GFR calc Af Amer: >60
GFR calc non Af Amer: >60
Glucose, Bld: 102 — ABNORMAL HIGH
Potassium: 3.5
Sodium: 133 — ABNORMAL LOW

## 2011-04-08 LAB — RAPID URINE DRUG SCREEN, HOSP PERFORMED
Barbiturates: NOT DETECTED
Benzodiazepines: NOT DETECTED
Cocaine: POSITIVE — AB
Opiates: NOT DETECTED

## 2011-04-08 LAB — DIFFERENTIAL
Basophils Absolute: 0.1
Basophils Relative: 1
Eosinophils Absolute: 0.1
Eosinophils Relative: 1
Lymphocytes Relative: 37
Lymphs Abs: 3.6
Monocytes Absolute: 0.9
Monocytes Relative: 9
Neutro Abs: 5.1
Neutrophils Relative %: 53

## 2011-04-08 LAB — CBC
HCT: 39
MCHC: 35.1
MCV: 89.3
Platelets: 316
WBC: 9.7

## 2011-04-08 LAB — ETHANOL: Alcohol, Ethyl (B): <5

## 2011-07-09 ENCOUNTER — Emergency Department (HOSPITAL_COMMUNITY)
Admission: EM | Admit: 2011-07-09 | Discharge: 2011-07-09 | Disposition: A | Discharge status: Home / Self Care | Payer: No Typology Code available for payment source | Attending: Emergency Medicine | Admitting: Emergency Medicine

## 2011-07-09 ENCOUNTER — Emergency Department (HOSPITAL_COMMUNITY): Payer: No Typology Code available for payment source

## 2011-07-09 ENCOUNTER — Encounter (HOSPITAL_COMMUNITY): Payer: Self-pay | Admitting: Emergency Medicine

## 2011-07-09 DIAGNOSIS — E079 Disorder of thyroid, unspecified: Secondary | ICD-10-CM | POA: Insufficient documentation

## 2011-07-09 DIAGNOSIS — Z79899 Other long term (current) drug therapy: Secondary | ICD-10-CM | POA: Insufficient documentation

## 2011-07-09 DIAGNOSIS — R51 Headache: Secondary | ICD-10-CM | POA: Insufficient documentation

## 2011-07-09 DIAGNOSIS — S161XXA Strain of muscle, fascia and tendon at neck level, initial encounter: Secondary | ICD-10-CM

## 2011-07-09 DIAGNOSIS — S139XXA Sprain of joints and ligaments of unspecified parts of neck, initial encounter: Secondary | ICD-10-CM | POA: Insufficient documentation

## 2011-07-09 DIAGNOSIS — M542 Cervicalgia: Secondary | ICD-10-CM | POA: Insufficient documentation

## 2011-07-09 DIAGNOSIS — Z7982 Long term (current) use of aspirin: Secondary | ICD-10-CM | POA: Insufficient documentation

## 2011-07-09 DIAGNOSIS — F17200 Nicotine dependence, unspecified, uncomplicated: Secondary | ICD-10-CM | POA: Insufficient documentation

## 2011-07-09 MED ORDER — ONDANSETRON HCL 4 MG PO TABS: 4.0000 mg | ORAL_TABLET | Freq: Four times a day (QID) | ORAL | Status: AC

## 2011-07-09 MED ORDER — ONDANSETRON 4 MG PO TBDP
4.0000 mg | ORAL_TABLET | Freq: Once | ORAL | Status: AC
  Administered 2011-07-09: 4 mg via ORAL
  Filled 2011-07-09: qty 1

## 2011-07-09 MED ORDER — HYDROCODONE-ACETAMINOPHEN 5-325 MG PO TABS: 1.0000 | ORAL_TABLET | Freq: Four times a day (QID) | ORAL | Status: AC | PRN

## 2011-07-09 MED ORDER — IBUPROFEN 800 MG PO TABS
800.0000 mg | ORAL_TABLET | Freq: Once | ORAL | Status: AC
  Administered 2011-07-09: 800 mg via ORAL
  Filled 2011-07-09: qty 1

## 2011-07-09 MED ORDER — HYDROCODONE-ACETAMINOPHEN 5-325 MG PO TABS
1.0000 | ORAL_TABLET | Freq: Once | ORAL | Status: AC
  Administered 2011-07-09: 1 via ORAL
  Filled 2011-07-09: qty 1
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
## 2011-07-09 NOTE — ED Provider Notes (Signed)
Medical screening examination/treatment/procedure(s) were performed by non-physician practitioner and as supervising physician I was immediately available for consultation/collaboration.   Benny Lennert, MD 07/09/11 1800

## 2011-11-14 ENCOUNTER — Emergency Department (HOSPITAL_COMMUNITY)
Admission: EM | Admit: 2011-11-14 | Discharge: 2011-11-14 | Disposition: A | Discharge status: Home / Self Care | Payer: Self-pay | Attending: Emergency Medicine | Admitting: Emergency Medicine

## 2011-11-14 ENCOUNTER — Encounter (HOSPITAL_COMMUNITY): Payer: Self-pay | Admitting: Emergency Medicine

## 2011-11-14 ENCOUNTER — Emergency Department (HOSPITAL_COMMUNITY): Payer: Self-pay

## 2011-11-14 DIAGNOSIS — E079 Disorder of thyroid, unspecified: Secondary | ICD-10-CM | POA: Insufficient documentation

## 2011-11-14 DIAGNOSIS — S91332A Puncture wound without foreign body, left foot, initial encounter: Secondary | ICD-10-CM

## 2011-11-14 DIAGNOSIS — Y93H2 Activity, gardening and landscaping: Secondary | ICD-10-CM | POA: Insufficient documentation

## 2011-11-14 DIAGNOSIS — M79609 Pain in unspecified limb: Secondary | ICD-10-CM | POA: Insufficient documentation

## 2011-11-14 DIAGNOSIS — W268XXA Contact with other sharp object(s), not elsewhere classified, initial encounter: Secondary | ICD-10-CM | POA: Insufficient documentation

## 2011-11-14 DIAGNOSIS — F17200 Nicotine dependence, unspecified, uncomplicated: Secondary | ICD-10-CM | POA: Insufficient documentation

## 2011-11-14 DIAGNOSIS — Z7982 Long term (current) use of aspirin: Secondary | ICD-10-CM | POA: Insufficient documentation

## 2011-11-14 DIAGNOSIS — S91309A Unspecified open wound, unspecified foot, initial encounter: Secondary | ICD-10-CM | POA: Insufficient documentation

## 2011-11-14 DIAGNOSIS — Z79899 Other long term (current) drug therapy: Secondary | ICD-10-CM | POA: Insufficient documentation

## 2011-11-14 MED ORDER — HYDROCODONE-ACETAMINOPHEN 5-325 MG PO TABS
1.0000 | ORAL_TABLET | Freq: Once | ORAL | Status: AC
  Administered 2011-11-14: 1 via ORAL
  Filled 2011-11-14: qty 1

## 2011-11-14 MED ORDER — HYDROCODONE-ACETAMINOPHEN 5-325 MG PO TABS: ORAL_TABLET | ORAL | Status: DC

## 2011-11-14 MED ORDER — CIPROFLOXACIN HCL 250 MG PO TABS
500.0000 mg | ORAL_TABLET | Freq: Once | ORAL | Status: AC
  Administered 2011-11-14: 500 mg via ORAL
  Filled 2011-11-14: qty 2

## 2011-11-14 MED ORDER — IBUPROFEN 800 MG PO TABS
800.0000 mg | ORAL_TABLET | Freq: Once | ORAL | Status: AC
  Administered 2011-11-14: 800 mg via ORAL
  Filled 2011-11-14: qty 1

## 2011-11-14 MED ORDER — TETANUS-DIPHTH-ACELL PERTUSSIS 5-2.5-18.5 LF-MCG/0.5 IM SUSP
0.5000 mL | Freq: Once | INTRAMUSCULAR | Status: AC
  Administered 2011-11-14: 0.5 mL via INTRAMUSCULAR
  Filled 2011-11-14: qty 0.5

## 2011-11-14 MED ORDER — CIPROFLOXACIN HCL 500 MG PO TABS: 500.0000 mg | ORAL_TABLET | Freq: Two times a day (BID) | ORAL | Status: AC

## 2011-11-14 NOTE — ED Notes (Signed)
Pt presents with "punture type" wound in proximal bottom of foot. Bleeding controlled. Pt denies pulling object from foot, however did "go through sole of shoe" while walking.

## 2011-11-14 NOTE — ED Provider Notes (Signed)
History     CSN: [id number]  Arrival date & time 11/14/11  1035   None     Chief Complaint  Patient presents with  . Foot Injury    (Consider location/radiation/quality/duration/timing/severity/associated sxs/prior treatment) Patient is a 51 y.o. male presenting with foot injury. The history is provided by the patient. No language interpreter was used.  Foot Injury  The incident occurred 1 to 2 hours ago. The incident occurred at home. Injury mechanism: stepped  on nail protruding from boar while mowing lawn. The pain is present in the left heel. Associated symptoms include inability to bear weight. Pertinent negatives include no numbness, no loss of motion, no loss of sensation and no tingling. He reports no foreign bodies present. The symptoms are aggravated by palpation and bearing weight. He has tried nothing for the symptoms.    Past Medical History  Diagnosis Date  . Thyroid disease     Past Surgical History  Procedure Date  . Cholecystectomy     History reviewed. No pertinent family history.  History  Substance Use Topics  . Smoking status: Current Everyday Smoker -- 1.0 packs/day for 37 years    Types: Cigarettes  . Smokeless tobacco: Never Used  . Alcohol Use: No      Review of Systems  Skin: Positive for wound.  Neurological: Negative for tingling, weakness and numbness.  All other systems reviewed and are negative.    Allergies  Codeine  Home Medications   Current Outpatient Rx  Name Route Sig Dispense Refill  . ASPIRIN EC 81 MG PO TBEC Oral Take 81 mg by mouth every morning.      Marland Kitchen CIPROFLOXACIN HCL 500 MG PO TABS Oral Take 1 tablet (500 mg total) by mouth 2 (two) times daily. 14 tablet 0  . CLONAZEPAM 0.5 MG PO TABS Oral Take 0.5 mg by mouth at bedtime.      Marland Kitchen HYDROCODONE-ACETAMINOPHEN 5-325 MG PO TABS  One tab po q 4-6 hrs prn pain 20 tablet 0  . LEVOTHYROXINE SODIUM 175 MCG PO TABS Oral Take 175 mcg by mouth every morning.     Marland Kitchen VITAMIN B-12  1000 MCG PO TABS Oral Take 1,000 mcg by mouth daily.      Marland Kitchen VITAMIN E 1000 UNITS PO CAPS Oral Take 2,000 Units by mouth every morning.        BP 117/84  Pulse 72  Temp(Src) 98.7 F (37.1 C) (Oral)  Resp 20  Ht 6' (1.829 m)  Wt 215 lb (97.523 kg)  BMI 29.16 kg/m2  SpO2 100%  Physical Exam  Nursing note and vitals reviewed. Constitutional: He is oriented to person, place, and time. He appears well-developed and well-nourished.  HENT:  Head: Normocephalic and atraumatic.  Eyes: EOM are normal.  Neck: Normal range of motion.  Cardiovascular: Normal rate, regular rhythm, normal heart sounds and intact distal pulses.   Pulmonary/Chest: Effort normal and breath sounds normal. No respiratory distress.  Abdominal: Soft. He exhibits no distension. There is no tenderness.  Musculoskeletal: He exhibits tenderness.       Left foot: He exhibits tenderness, bony tenderness and laceration. He exhibits normal range of motion, no swelling, normal capillary refill, no crepitus and no deformity.       Feet:  Neurological: He is alert and oriented to person, place, and time.  Skin: Skin is warm and dry.  Psychiatric: He has a normal mood and affect. Judgment normal.    ED Course  Procedures (including critical care  time)  Labs Reviewed - No data to display Dg Foot Complete Left  11/14/2011  *RADIOLOGY REPORT*  Clinical Data: Rule out foreign body  LEFT FOOT - COMPLETE 3+ VIEW  Comparison: None.  Findings: Three views of the left foot submitted.  No acute fracture or subluxation.  Small plantar spur of the calcaneus.  No radiopaque foreign body is identified.  IMPRESSION: No acute fracture or subluxation.  Small plantar spur of the calcaneus.  No radiopaque foreign body is identified.  Original Report Authenticated By: Natasha Mead, M.D.     1. Puncture wound of left foot       MDM  No visible FB or fx on x-ray. rx-cipro 500 mg, BID, 14 rx-hydrocodone, 20 Ibuprofen 800 mg TID DT  given        Worthy Rancher, PA 11/14/11 1228

## 2011-11-14 NOTE — ED Notes (Signed)
Pt discharged to home, post op shoe applied, pt verbalized understanding of dc instructions.  Dressing applied to wound.

## 2011-11-14 NOTE — ED Notes (Signed)
Pt stepped on nail at bottom of Left foot. Bleeding has stopped. Nad. Denies being workers comp

## 2011-11-14 NOTE — ED Notes (Signed)
Wound cleansed with saf clens and water. Bleeding controlled.Pt tolerated well.

## 2011-11-14 NOTE — Discharge Instructions (Signed)
Puncture Wound A puncture wound is an injury that extends through all layers of the skin and into the tissue beneath the skin (subcutaneous tissue). Puncture wounds become infected easily because germs often enter the body and go beneath the skin during the injury. Having a deep wound with a small entrance point makes it difficult for your caregiver to adequately clean the wound. This is especially true if you have stepped on a nail and it has passed through a dirty shoe or other situations where the wound is obviously contaminated. CAUSES  Many puncture wounds involve glass, nails, splinters, fish hooks, or other objects that enter the skin (foreign bodies). A puncture wound may also be caused by a human bite or animal bite. DIAGNOSIS  A puncture wound is usually diagnosed by your history and a physical exam. You may need to have an X-ray or an ultrasound to check for any foreign bodies still in the wound. TREATMENT   Your caregiver will clean the wound as thoroughly as possible. Depending on the location of the wound, a bandage (dressing) may be applied.   Your caregiver might prescribe antibiotic medicines.   You may need a follow-up visit to check on your wound. Follow all instructions as directed by your caregiver.  HOME CARE INSTRUCTIONS   Change your dressing once per day, or as directed by your caregiver. If the dressing sticks, it may be removed by soaking the area in water.   If your caregiver has given you follow-up instructions, it is very important that you return for a follow-up appointment. Not following up as directed could result in a chronic or permanent injury, pain, and disability.   Only take over-the-counter or prescription medicines for pain, discomfort, or fever as directed by your caregiver.   If you are given antibiotics, take them as directed. Finish them even if you start to feel better.  You may need a tetanus shot if:  You cannot remember when you had your last  tetanus shot.   You have never had a tetanus shot.  If you got a tetanus shot, your arm may swell, get red, and feel warm to the touch. This is common and not a problem. If you need a tetanus shot and you choose not to have one, there is a rare chance of getting tetanus. Sickness from tetanus can be serious. You may need a rabies shot if an animal bite caused your puncture wound. SEEK MEDICAL CARE IF:   You have redness, swelling, or increasing pain in the wound.   You have red streaks going away from the wound.   You notice a bad smell coming from the wound or dressing.   You have yellowish-white fluid (pus) coming from the wound.   You are treated with an antibiotic for infection, but the infection is not getting better.   You notice something in the wound, such as rubber from your shoe, cloth, or another object.   You have a fever.   You have severe pain.   You have difficulty breathing.   You feel dizzy or faint.   You cannot stop vomiting.   You lose feeling, develop numbness, or cannot move a limb below the wound.   Your symptoms worsen.  MAKE SURE YOU:  Understand these instructions.   Will watch your condition.   Will get help right away if you are not doing well or get worse.  Document Released: 04/09/2005 Document Revised: 06/19/2011 Document Reviewed: 12/17/2010 [employer] Patient Information 2012  [employer]   Wash the puncture twice daily with soap and water.  Take the meds as directed.  Take ibuprofen up to 800 mg every 8 hrs with food.  tthe x-rays show no fracture or foreign body although forein bodies are not always visible.  Return to the ED if your symptoms worsen or change significantly.

## 2011-11-15 NOTE — ED Provider Notes (Signed)
Medical screening examination/treatment/procedure(s) were performed by non-physician practitioner and as supervising physician I was immediately available for consultation/collaboration.   Joya Gaskins, MD 11/15/11 [phone]

## 2012-01-11 ENCOUNTER — Encounter (HOSPITAL_COMMUNITY): Payer: Self-pay | Admitting: Emergency Medicine

## 2012-01-11 ENCOUNTER — Emergency Department (HOSPITAL_COMMUNITY): Payer: Self-pay

## 2012-01-11 ENCOUNTER — Emergency Department (HOSPITAL_COMMUNITY)
Admission: EM | Admit: 2012-01-11 | Discharge: 2012-01-11 | Disposition: A | Discharge status: Home / Self Care | Payer: Self-pay | Attending: Emergency Medicine | Admitting: Emergency Medicine

## 2012-01-11 DIAGNOSIS — W19XXXA Unspecified fall, initial encounter: Secondary | ICD-10-CM

## 2012-01-11 DIAGNOSIS — S40011A Contusion of right shoulder, initial encounter: Secondary | ICD-10-CM

## 2012-01-11 DIAGNOSIS — S161XXA Strain of muscle, fascia and tendon at neck level, initial encounter: Secondary | ICD-10-CM

## 2012-01-11 DIAGNOSIS — M542 Cervicalgia: Secondary | ICD-10-CM | POA: Insufficient documentation

## 2012-01-11 DIAGNOSIS — S40029A Contusion of unspecified upper arm, initial encounter: Secondary | ICD-10-CM | POA: Insufficient documentation

## 2012-01-11 DIAGNOSIS — S40019A Contusion of unspecified shoulder, initial encounter: Secondary | ICD-10-CM | POA: Insufficient documentation

## 2012-01-11 DIAGNOSIS — W11XXXA Fall on and from ladder, initial encounter: Secondary | ICD-10-CM | POA: Insufficient documentation

## 2012-01-11 DIAGNOSIS — S139XXA Sprain of joints and ligaments of unspecified parts of neck, initial encounter: Secondary | ICD-10-CM | POA: Insufficient documentation

## 2012-01-11 DIAGNOSIS — R51 Headache: Secondary | ICD-10-CM | POA: Insufficient documentation

## 2012-01-11 MED ORDER — HYDROCODONE-ACETAMINOPHEN 5-500 MG PO TABS: 1.0000 | ORAL_TABLET | Freq: Four times a day (QID) | ORAL | Status: AC | PRN

## 2012-01-11 MED ORDER — HYDROCODONE-ACETAMINOPHEN 5-325 MG PO TABS
2.0000 | ORAL_TABLET | Freq: Once | ORAL | Status: AC
  Administered 2012-01-11: 2 via ORAL
  Filled 2012-01-11: qty 2

## 2012-01-11 NOTE — Discharge Instructions (Signed)
Take motrin or aleve as need for pain. You may also take vicodin as need for pain. No driving for the next 6 hours or when taking vicodin. Also, do not take tylenol or acetaminophen containing medication when taking vicodin.  Your ct scan was read as showing no acute process. Incidental note was made of a probable left thyroid nodule - follow up with primary care doctor in the next 1-2 weeks, discuss this finding with them, and have them arrange an outpatient thyroid ultrasound to further evaluate.   Return to ER if worse, new symptoms, severe pain, vomiting, other concern.     Cervical Sprain A cervical sprain is an injury in the neck in which the ligaments are stretched or torn. The ligaments are the tissues that hold the bones of the neck (vertebrae) in place.Cervical sprains can range from very mild to very severe. Most cervical sprains get better in 1 to 3 weeks, but it depends on the cause and extent of the injury. Severe cervical sprains can cause the neck vertebrae to be unstable. This can lead to damage of the spinal cord and can result in serious nervous system problems. Your caregiver will determine whether your cervical sprain is mild or severe. CAUSES  Severe cervical sprains may be caused by:  Contact sport injuries (football, rugby, wrestling, hockey, auto racing, gymnastics, diving, martial arts, boxing).   Motor vehicle collisions.   Whiplash injuries. This means the neck is forcefully whipped backward and forward.   Falls.  Mild cervical sprains may be caused by:   Awkward positions, such as cradling a telephone between your ear and shoulder.   Sitting in a chair that does not offer proper support.   Working at a poorly Marketing executive station.   Activities that require looking up or down for long periods of time.  SYMPTOMS   Pain, soreness, stiffness, or a burning sensation in the front, back, or sides of the neck. This discomfort may develop immediately after  injury or it may develop slowly and not begin for 24 hours or more after an injury.   Pain or tenderness directly in the middle of the back of the neck.   Shoulder or upper back pain.   Limited ability to move the neck.   Headache.   Dizziness.   Weakness, numbness, or tingling in the hands or arms.   Muscle spasms.   Difficulty swallowing or chewing.   Tenderness and swelling of the neck.  DIAGNOSIS  Most of the time, your caregiver can diagnose this problem by taking your history and doing a physical exam. Your caregiver will ask about any known problems, such as arthritis in the neck or a previous neck injury. X-rays may be taken to find out if there are any other problems, such as problems with the bones of the neck. However, an X-ray often does not reveal the full extent of a cervical sprain. Other tests such as a computed tomography (CT) scan or magnetic resonance imaging (MRI) may be needed. TREATMENT  Treatment depends on the severity of the cervical sprain. Mild sprains can be treated with rest, keeping the neck in place (immobilization), and pain medicines. Severe cervical sprains need immediate immobilization and an appointment with an orthopedist or neurosurgeon. Several treatment options are available to help with pain, muscle spasms, and other symptoms. Your caregiver may prescribe:  Medicines, such as pain relievers, numbing medicines, or muscle relaxants.   Physical therapy. This can include stretching exercises, strengthening exercises, and posture  training. Exercises and improved posture can help stabilize the neck, strengthen muscles, and help stop symptoms from returning.   A neck collar to be worn for short periods of time. Often, these collars are worn for comfort. However, certain collars may be worn to protect the neck and prevent further worsening of a serious cervical sprain.  HOME CARE INSTRUCTIONS   Put ice on the injured area.   Put ice in a plastic bag.     Place a towel between your skin and the bag.   Leave the ice on for 15 to 20 minutes, 3 to 4 times a day.   Only take over-the-counter or prescription medicines for pain, discomfort, or fever as directed by your caregiver.   Keep all follow-up appointments as directed by your caregiver.   Keep all physical therapy appointments as directed by your caregiver.   If a neck collar is prescribed, wear it as directed by your caregiver.   Do not drive while wearing a neck collar.   Make any needed adjustments to your work station to promote good posture.   Avoid positions and activities that make your symptoms worse.   Warm up and stretch before being active to help prevent problems.  SEEK MEDICAL CARE IF:   Your pain is not controlled with medicine.   You are unable to decrease your pain medicine over time as planned.   Your activity level is not improving as expected.  SEEK IMMEDIATE MEDICAL CARE IF:   You develop any bleeding, stomach upset, or signs of an allergic reaction to your medicine.   Your symptoms get worse.   You develop new, unexplained symptoms.   You have numbness, tingling, weakness, or paralysis in any part of your body.  MAKE SURE YOU:   Understand these instructions.   Will watch your condition.   Will get help right away if you are not doing well or get worse.  Document Released: 04/27/2007 Document Revised: 06/19/2011 Document Reviewed: 04/02/2011 [facility] Patient Information 2012 [place], [place].     Contusion A contusion is a deep bruise. Contusions are the result of an injury that caused bleeding under the skin. The contusion may turn blue, purple, or yellow. Minor injuries will give you a painless contusion, but more severe contusions may stay painful and swollen for a few weeks.  CAUSES  A contusion is usually caused by a blow, trauma, or direct force to an area of the body. SYMPTOMS   Swelling and redness of the injured area.   Bruising  of the injured area.   Tenderness and soreness of the injured area.   Pain.  DIAGNOSIS  The diagnosis can be made by taking a history and physical exam. An X-ray, CT scan, or MRI may be needed to determine if there were any associated injuries, such as fractures. TREATMENT  Specific treatment will depend on what area of the body was injured. In general, the best treatment for a contusion is resting, icing, elevating, and applying cold compresses to the injured area. Over-the-counter medicines may also be recommended for pain control. Ask your caregiver what the best treatment is for your contusion. HOME CARE INSTRUCTIONS   Put ice on the injured area.   Put ice in a plastic bag.   Place a towel between your skin and the bag.   Leave the ice on for 15 to 20 minutes, 3 to 4 times a day.   Only take over-the-counter or prescription medicines for pain, discomfort, or fever  as directed by your caregiver. Your caregiver may recommend avoiding anti-inflammatory medicines (aspirin, ibuprofen, and naproxen) for 48 hours because these medicines may increase bruising.   Rest the injured area.   If possible, elevate the injured area to reduce swelling.  SEEK IMMEDIATE MEDICAL CARE IF:   You have increased bruising or swelling.   You have pain that is getting worse.   Your swelling or pain is not relieved with medicines.  MAKE SURE YOU:   Understand these instructions.   Will watch your condition.   Will get help right away if you are not doing well or get worse.  Document Released: 04/09/2005 Document Revised: 06/19/2011 Document Reviewed: 05/05/2011 [facility] Patient Information 2012 [place], [place].    Shoulder Sprain A shoulder sprain is the result of damage to the tough, fiber-like tissues (ligaments) that help hold your shoulder in place. The ligaments may be stretched or torn. Besides the main shoulder joint (the ball and socket), there are several smaller joints that connect the  bones in this area. A sprain usually involves one of those joints. Most often it is the acromioclavicular (or AC) joint. That is the joint that connects the collarbone (clavicle) and the shoulder blade (scapula) at the top point of the shoulder blade (acromion). A shoulder sprain is a mild form of what is called a shoulder separation. Recovering from a shoulder sprain may take some time. For some, pain lingers for several months. Most people recover without long term problems. CAUSES   A shoulder sprain is usually caused by some kind of trauma. This might be:   Falling on an outstretched arm.   Being hit hard on the shoulder.   Twisting the arm.   Shoulder sprains are more likely to occur in people who:   Play sports.   Have balance or coordination problems.  SYMPTOMS   Pain when you move your shoulder.   Limited ability to move the shoulder.   Swelling and tenderness on top of the shoulder.   Redness or warmth in the shoulder.   Bruising.   A change in the shape of the shoulder.  DIAGNOSIS  Your healthcare provider may:  Ask about your symptoms.   Ask about recent activity that might have caused those symptoms.   Examine your shoulder. You may be asked to do simple exercises to test movement. The other shoulder will be examined for comparison.   Order some tests that provide a look inside the body. They can show the extent of the injury. The tests could include:   X-rays.   CT (computed tomography) scan.   MRI (magnetic resonance imaging) scan.  RISKS AND COMPLICATIONS  Loss of full shoulder motion.   Ongoing shoulder pain.  TREATMENT  How long it takes to recover from a shoulder sprain depends on how severe it was. Treatment options may include:  Rest. You should not use the arm or shoulder until it heals.   Ice. For 2 or 3 days after the injury, put an ice pack on the shoulder up to 4 times a day. It should stay on for 15 to 20 minutes each time. Wrap the  ice in a towel so it does not touch your skin.   Over-the-counter medicine to relieve pain.   A sling or brace. This will keep the arm still while the shoulder is healing.   Physical therapy or rehabilitation exercises. These will help you regain strength and motion. Ask your healthcare provider when it is OK to  begin these exercises.   Surgery. The need for surgery is rare with a sprained shoulder, but some people may need surgery to keep the joint in place and reduce pain.  HOME CARE INSTRUCTIONS   Ask your healthcare provider about what you should and should not do while your shoulder heals.   Make sure you know how to apply ice to the correct area of your shoulder.   Talk with your healthcare provider about which medications should be used for pain and swelling.   If rehabilitation therapy will be needed, ask your healthcare provider to refer you to a therapist. If it is not recommended, then ask about at-home exercises. Find out when exercise should begin.  SEEK MEDICAL CARE IF:  Your pain, swelling, or redness at the joint increases. SEEK IMMEDIATE MEDICAL CARE IF:   You have a fever.   You cannot move your arm or shoulder.  Document Released: 11/16/2008 Document Revised: 06/19/2011 Document Reviewed: 11/16/2008 [facility] Patient Information 2012 [place], [place].    Head Injury, Adult You have had a head injury that does not appear serious at this time. A concussion is a state of changed mental ability, usually from a blow to the head. You should take clear liquids for the rest of the day and then resume your regular diet. You should not take sedatives or alcoholic beverages for as long as directed by your caregiver after discharge. After injuries such as yours, most problems occur within the first 24 hours. SYMPTOMS These minor symptoms may be experienced after discharge:  Memory difficulties.   Dizziness.   Headaches.   Double vision.   Hearing difficulties.    Depression.   Tiredness.   Weakness.   Difficulty with concentration.  If you experience any of these problems, you should not be alarmed. A concussion requires a few days for recovery. Many patients with head injuries frequently experience such symptoms. Usually, these problems disappear without medical care. If symptoms last for more than one day, notify your caregiver. See your caregiver sooner if symptoms are becoming worse rather than better. HOME CARE INSTRUCTIONS   During the next 24 hours you must stay with someone who can watch you for the warning signs listed below.  Although it is unlikely that serious side effects will occur, you should be aware of signs and symptoms which may necessitate your return to this location. Side effects may occur up to 7 - 10 days following the injury. It is important for you to carefully monitor your condition and contact your caregiver or seek immediate medical attention if there is a change in your condition. SEEK IMMEDIATE MEDICAL CARE IF:   There is confusion or drowsiness.   You can not awaken the injured person.   There is nausea (feeling sick to your stomach) or continued, forceful vomiting.   You notice dizziness or unsteadiness which is getting worse, or inability to walk.   You have convulsions or unconsciousness.   You experience severe, persistent headaches not relieved by over-the-counter or prescription medicines for pain. (Do not take aspirin as this impairs clotting abilities). Take other pain medications only as directed.   You can not use arms or legs normally.   There is clear or bloody discharge from the nose or ears.  MAKE SURE YOU:   Understand these instructions.   Will watch your condition.   Will get help right away if you are not doing well or get worse.  Document Released: 06/30/2005 Document Revised: 06/19/2011 Document  Reviewed: 05/18/2009 [facility] Patient Information 2012 [place], [place].

## 2012-01-11 NOTE — ED Notes (Signed)
Patient with fall from ladder, approximately 10 foot fall. Denies LOC. C/o upper neck pain and headache. C-collar applied in triage.

## 2012-01-11 NOTE — ED Provider Notes (Signed)
History     CSN: [id number]  Arrival date & time 01/11/12  1843   First MD Initiated Contact with Patient 01/11/12 1855      Chief Complaint  Patient presents with  . Fall    (Consider location/radiation/quality/duration/timing/severity/associated sxs/prior treatment) Patient is a 51 y.o. male presenting with fall. The history is provided by the patient.  Fall Associated symptoms include headaches. Pertinent negatives include no fever, no numbness, no abdominal pain and no vomiting.  pt indicates was doing some painting on ladder, reached out for spot far to side and fell from ladder onto right shoulder, hit head. No loc. C/o diffuse head pain, right neck pain and right shoulder pain. Pain dull, constant, non radiating. No radicular pain down arm. No numbness or weakness. Denies nv. No back pain. No cp or sob. No abd pain. Denies other pain or injury.   Past Medical History  Diagnosis Date  . Thyroid disease     Past Surgical History  Procedure Date  . Cholecystectomy     No family history on file.  History  Substance Use Topics  . Smoking status: Current Everyday Smoker -- 1.0 packs/day for 37 years    Types: Cigarettes  . Smokeless tobacco: Never Used  . Alcohol Use: No      Review of Systems  Constitutional: Negative for fever.  HENT: Positive for neck pain.   Eyes: Negative for pain and visual disturbance.  Respiratory: Negative for shortness of breath.   Cardiovascular: Negative for chest pain.  Gastrointestinal: Negative for vomiting and abdominal pain.  Genitourinary: Negative for flank pain.  Musculoskeletal: Negative for back pain.  Skin: Negative for rash.  Neurological: Positive for headaches. Negative for weakness and numbness.  Hematological: Does not bruise/bleed easily.  Psychiatric/Behavioral: Negative for confusion.    Allergies  Codeine  Home Medications   Current Outpatient Rx  Name Route Sig Dispense Refill  . ASPIRIN EC 81 MG PO  TBEC Oral Take 81 mg by mouth every morning.      Marland Kitchen CLONAZEPAM 0.5 MG PO TABS Oral Take 0.5 mg by mouth at bedtime.      Marland Kitchen HYDROCODONE-ACETAMINOPHEN 5-325 MG PO TABS  One tab po q 4-6 hrs prn pain 20 tablet 0  . LEVOTHYROXINE SODIUM 175 MCG PO TABS Oral Take 175 mcg by mouth every morning.     Marland Kitchen VITAMIN B-12 1000 MCG PO TABS Oral Take 1,000 mcg by mouth daily.      Marland Kitchen VITAMIN E 1000 UNITS PO CAPS Oral Take 2,000 Units by mouth every morning.        BP 135/81  Pulse 70  Temp 98.7 F (37.1 C) (Oral)  Resp 18  Ht 6' (1.829 m)  Wt 225 lb (102.059 kg)  BMI 30.52 kg/m2  SpO2 95%  Physical Exam  Nursing note and vitals reviewed. Constitutional: He is oriented to person, place, and time. He appears well-developed and well-nourished. No distress.  HENT:  Nose: Nose normal.  Mouth/Throat: Oropharynx is clear and moist.       Tenderness right scalp. No signif sts noted.   Eyes: Conjunctivae and EOM are normal. Pupils are equal, round, and reactive to light.  Neck: Neck supple. No tracheal deviation present.       ccollar  Cardiovascular: Normal rate, regular rhythm, normal heart sounds and intact distal pulses.   Pulmonary/Chest: Effort normal and breath sounds normal. No accessory muscle usage. No respiratory distress. He exhibits no tenderness.  Abdominal: Soft. Bowel sounds  are normal. He exhibits no distension. There is no tenderness.       No abd contusion or bruising noted.   Musculoskeletal: Normal range of motion.       Tenderness right shoulder. No deformity. Radial pulse 2+. Good rom bil ext no other focal bony tenderness noted. Mid cervical tenderness otherwise CTLS spine, non tender, aligned, no step off.   Neurological: He is alert and oriented to person, place, and time.       Motor intact bil.   Skin: Skin is warm and dry.  Psychiatric: He has a normal mood and affect.    ED Course  Procedures (including critical care time)  Dg Shoulder Right  01/11/2012  *RADIOLOGY  REPORT*  Clinical Data: Fall, right shoulder pain.  RIGHT SHOULDER - 2+ VIEW  Comparison: None  Findings: Mild degenerative changes in the right AC joint. No acute bony abnormality.  Specifically, no fracture, subluxation, or dislocation.  Soft tissues are intact.  IMPRESSION: No acute bony abnormality.  Original Report Authenticated By: Cyndie Chime, M.D.   Ct Head Wo Contrast  01/11/2012  *RADIOLOGY REPORT*  Clinical Data:  Right neck and shoulder pain.  CT HEAD WITHOUT CONTRAST CT CERVICAL SPINE WITHOUT CONTRAST  Technique:  Multidetector CT imaging of the head and cervical spine was performed following the standard protocol without intravenous contrast.  Multiplanar CT image reconstructions of the cervical spine were also generated.  Comparison:  04/15/2010.  CT HEAD  Findings: No acute intracranial abnormality.  Specifically, no hemorrhage, hydrocephalus, mass lesion, acute infarction, or significant intracranial injury.  No acute calvarial abnormality.  IMPRESSION: No acute intracranial abnormality.  CT CERVICAL SPINE  Findings: Normal alignment.  Disc spaces are maintained. Prevertebral soft tissues are normal.  No fracture.  No epidural or paraspinal hematoma.  Lung apices are clear.  Enlargement of the left thyroid lobe with small calcification, suspected nodule.  IMPRESSION: No acute bony abnormality.  Suspect left thyroid nodule.  This can be further evaluated with elective thyroid ultrasound.  Original Report Authenticated By: Cyndie Chime, M.D.   Ct Cervical Spine Wo Contrast  01/11/2012  *RADIOLOGY REPORT*  Clinical Data:  Right neck and shoulder pain.  CT HEAD WITHOUT CONTRAST CT CERVICAL SPINE WITHOUT CONTRAST  Technique:  Multidetector CT imaging of the head and cervical spine was performed following the standard protocol without intravenous contrast.  Multiplanar CT image reconstructions of the cervical spine were also generated.  Comparison:  04/15/2010.  CT HEAD  Findings: No acute  intracranial abnormality.  Specifically, no hemorrhage, hydrocephalus, mass lesion, acute infarction, or significant intracranial injury.  No acute calvarial abnormality.  IMPRESSION: No acute intracranial abnormality.  CT CERVICAL SPINE  Findings: Normal alignment.  Disc spaces are maintained. Prevertebral soft tissues are normal.  No fracture.  No epidural or paraspinal hematoma.  Lung apices are clear.  Enlargement of the left thyroid lobe with small calcification, suspected nodule.  IMPRESSION: No acute bony abnormality.  Suspect left thyroid nodule.  This can be further evaluated with elective thyroid ultrasound.  Original Report Authenticated By: Cyndie Chime, M.D.      MDM  Ct. Xr. vicodin po.   Recheck pt comfortable. Spine nt. Informed of thyroid nodule and need pcp follow up/ultrasound.      Suzi Roots, MD 01/11/12 2000

## 2012-06-01 ENCOUNTER — Emergency Department (HOSPITAL_COMMUNITY)
Admission: EM | Admit: 2012-06-01 | Discharge: 2012-06-01 | Disposition: A | Discharge status: Home / Self Care | Payer: Self-pay | Attending: Emergency Medicine | Admitting: Emergency Medicine

## 2012-06-01 ENCOUNTER — Encounter (HOSPITAL_COMMUNITY): Payer: Self-pay | Admitting: *Deleted

## 2012-06-01 ENCOUNTER — Emergency Department (HOSPITAL_COMMUNITY): Payer: Self-pay

## 2012-06-01 DIAGNOSIS — W19XXXA Unspecified fall, initial encounter: Secondary | ICD-10-CM

## 2012-06-01 DIAGNOSIS — Y929 Unspecified place or not applicable: Secondary | ICD-10-CM | POA: Insufficient documentation

## 2012-06-01 DIAGNOSIS — E079 Disorder of thyroid, unspecified: Secondary | ICD-10-CM | POA: Insufficient documentation

## 2012-06-01 DIAGNOSIS — Y939 Activity, unspecified: Secondary | ICD-10-CM | POA: Insufficient documentation

## 2012-06-01 DIAGNOSIS — S7000XA Contusion of unspecified hip, initial encounter: Secondary | ICD-10-CM | POA: Insufficient documentation

## 2012-06-01 DIAGNOSIS — F17200 Nicotine dependence, unspecified, uncomplicated: Secondary | ICD-10-CM | POA: Insufficient documentation

## 2012-06-01 DIAGNOSIS — S300XXA Contusion of lower back and pelvis, initial encounter: Secondary | ICD-10-CM

## 2012-06-01 DIAGNOSIS — Z7982 Long term (current) use of aspirin: Secondary | ICD-10-CM | POA: Insufficient documentation

## 2012-06-01 DIAGNOSIS — Z79899 Other long term (current) drug therapy: Secondary | ICD-10-CM | POA: Insufficient documentation

## 2012-06-01 DIAGNOSIS — W11XXXA Fall on and from ladder, initial encounter: Secondary | ICD-10-CM | POA: Insufficient documentation

## 2012-06-01 MED ORDER — ONDANSETRON HCL 4 MG/2ML IJ SOLN
4.0000 mg | Freq: Once | INTRAMUSCULAR | Status: AC
  Administered 2012-06-01: 4 mg via INTRAVENOUS

## 2012-06-01 MED ORDER — SODIUM CHLORIDE 0.9 % IV SOLN: Freq: Once | INTRAVENOUS | Status: DC

## 2012-06-01 MED ORDER — OXYCODONE-ACETAMINOPHEN 5-325 MG PO TABS: 1.0000 | ORAL_TABLET | Freq: Four times a day (QID) | ORAL | Status: DC | PRN

## 2012-06-01 MED ORDER — SODIUM CHLORIDE 0.9 % IV BOLUS (SEPSIS)
1000.0000 mL | Freq: Once | INTRAVENOUS | Status: AC
  Administered 2012-06-01: 1000 mL via INTRAVENOUS

## 2012-06-01 MED ORDER — HYDROMORPHONE HCL PF 1 MG/ML IJ SOLN
1.0000 mg | Freq: Once | INTRAMUSCULAR | Status: AC
  Administered 2012-06-01: 1 mg via INTRAVENOUS
  Filled 2012-06-01: qty 1

## 2012-06-01 MED ORDER — ONDANSETRON HCL 4 MG/2ML IJ SOLN
INTRAMUSCULAR | Status: AC
  Administered 2012-06-01: 4 mg via INTRAVENOUS
  Filled 2012-06-01: qty 2

## 2012-06-01 NOTE — ED Notes (Signed)
Fell app 25 feet out of tree when cutting limbs on tree.  No LOC   Landed on left side.  Pain lt leg  No neck  Pain.

## 2012-06-01 NOTE — ED Provider Notes (Signed)
History     CSN: [id number]  Arrival date & time 06/01/12  1818   First MD Initiated Contact with Patient 06/01/12 1840      Chief Complaint  Patient presents with  . Fall    (Consider location/radiation/quality/duration/timing/severity/associated sxs/prior treatment) HPI Comments: Patient states he was up on a ladder trimming branches and fell about 25 feet to the ground.  He complains of severe pain in the left hip.  He was able to ambulate and was brought here by his wife in the car.  He did not require ambulance transport.  He denies any other injury or trauma.    Patient is a 51 y.o. male presenting with fall. The history is provided by the patient.  Fall The accident occurred less than 1 hour ago. The fall occurred from a ladder. Distance fallen: states "25 feet". He landed on dirt. There was no blood loss. The point of impact was the left hip. The pain is severe. He was ambulatory at the scene.    Past Medical History  Diagnosis Date  . Thyroid disease     Past Surgical History  Procedure Date  . Cholecystectomy     History reviewed. No pertinent family history.  History  Substance Use Topics  . Smoking status: Current Every Day Smoker -- 1.0 packs/day for 37 years    Types: Cigarettes  . Smokeless tobacco: Never Used  . Alcohol Use: No      Review of Systems  All other systems reviewed and are negative.    Allergies  Codeine  Home Medications   Current Outpatient Rx  Name  Route  Sig  Dispense  Refill  . ASPIRIN 81 MG PO CHEW   Oral   Chew 81 mg by mouth daily.         Marland Kitchen DIAZEPAM 10 MG PO TABS   Oral   Take 10 mg by mouth at bedtime.         Marland Kitchen LEVOTHYROXINE SODIUM 200 MCG PO TABS   Oral   Take 200 mcg by mouth daily.         Marland Kitchen VITAMIN B-12 1000 MCG PO TABS   Oral   Take 1,000 mcg by mouth daily.           Marland Kitchen VITAMIN E 1000 UNITS PO CAPS   Oral   Take 2,000 Units by mouth every morning.             BP 133/105  Pulse 93   Temp 97.3 F (36.3 C) (Oral)  Resp 20  SpO2 100%  Physical Exam  Nursing note and vitals reviewed. Constitutional: He is oriented to person, place, and time. He appears well-developed and well-nourished.  HENT:  Head: Normocephalic and atraumatic.  Mouth/Throat: Oropharynx is clear and moist.  Neck: Normal range of motion. Neck supple.  Cardiovascular: Normal rate and regular rhythm.   No murmur heard. Pulmonary/Chest: Effort normal and breath sounds normal. No respiratory distress.  Abdominal: Soft. Bowel sounds are normal. He exhibits no distension. There is no tenderness.  Neurological: He is alert and oriented to person, place, and time.       The patient is complaining of severe pain in the left hip.  It appears grossly normal without any shortening or external rotation.  Distal sensation, motor, and pulses are easily palpable. The discomfort is not exacerbated with external rotation of the hip.    Skin: Skin is warm and dry.    ED Course  Procedures (  including critical care time)  Labs Reviewed - No data to display No results found.   No diagnosis found.    MDM  The xrays look okay.  He pain is significantly improved with medication in the ED.  The distal extremity is intact and seems appropriate for discharge.          Geoffery Lyons, MD 06/01/12 2015

## 2012-06-13 ENCOUNTER — Emergency Department (HOSPITAL_COMMUNITY): Payer: Self-pay

## 2012-06-13 ENCOUNTER — Emergency Department (HOSPITAL_COMMUNITY)
Admission: EM | Admit: 2012-06-13 | Discharge: 2012-06-13 | Disposition: A | Discharge status: Home / Self Care | Payer: Self-pay | Attending: Emergency Medicine | Admitting: Emergency Medicine

## 2012-06-13 ENCOUNTER — Encounter (HOSPITAL_COMMUNITY): Payer: Self-pay | Admitting: Emergency Medicine

## 2012-06-13 DIAGNOSIS — F17200 Nicotine dependence, unspecified, uncomplicated: Secondary | ICD-10-CM | POA: Insufficient documentation

## 2012-06-13 DIAGNOSIS — Z9889 Other specified postprocedural states: Secondary | ICD-10-CM | POA: Insufficient documentation

## 2012-06-13 DIAGNOSIS — E079 Disorder of thyroid, unspecified: Secondary | ICD-10-CM | POA: Insufficient documentation

## 2012-06-13 DIAGNOSIS — Z79899 Other long term (current) drug therapy: Secondary | ICD-10-CM | POA: Insufficient documentation

## 2012-06-13 DIAGNOSIS — M79609 Pain in unspecified limb: Secondary | ICD-10-CM | POA: Insufficient documentation

## 2012-06-13 DIAGNOSIS — M545 Low back pain, unspecified: Secondary | ICD-10-CM | POA: Insufficient documentation

## 2012-06-13 DIAGNOSIS — Z7982 Long term (current) use of aspirin: Secondary | ICD-10-CM | POA: Insufficient documentation

## 2012-06-13 DIAGNOSIS — M549 Dorsalgia, unspecified: Secondary | ICD-10-CM

## 2012-06-13 LAB — URINALYSIS, ROUTINE W REFLEX MICROSCOPIC
Ketones, ur: NEGATIVE mg/dL
Leukocytes, UA: NEGATIVE
Nitrite: NEGATIVE
pH: 6.5 (ref 5.0–8.0)

## 2012-06-13 MED ORDER — KETOROLAC TROMETHAMINE 60 MG/2ML IM SOLN
60.0000 mg | Freq: Once | INTRAMUSCULAR | Status: AC
  Administered 2012-06-13: 60 mg via INTRAMUSCULAR
  Filled 2012-06-13: qty 2

## 2012-06-13 MED ORDER — HYDROMORPHONE HCL PF 2 MG/ML IJ SOLN
2.0000 mg | Freq: Once | INTRAMUSCULAR | Status: AC
  Administered 2012-06-13: 2 mg via INTRAMUSCULAR
  Filled 2012-06-13: qty 1

## 2012-06-13 MED ORDER — OXYCODONE-ACETAMINOPHEN 5-325 MG PO TABS: 2.0000 | ORAL_TABLET | ORAL | Status: DC | PRN

## 2012-06-13 MED ORDER — IBUPROFEN 800 MG PO TABS: 800.0000 mg | ORAL_TABLET | Freq: Three times a day (TID) | ORAL | Status: DC

## 2012-06-13 NOTE — ED Notes (Signed)
Pt c/o continued back/leg pain since fall in November. Pt was seen here for fall.

## 2012-06-13 NOTE — ED Provider Notes (Signed)
History   This chart was scribed for Glynn Octave, MD by Leone Payor, ED Scribe. This patient was seen in room [facility]08/[facility]08 and the patient's care was started at 1707.   CSN: [id number]  Arrival date & time 06/13/12  1432   First MD Initiated Contact with Patient 06/13/12 1707      Chief Complaint  Patient presents with  . Back Pain  . Leg Pain     The history is provided by the patient. No language interpreter was used.    Jeremy Weeks is a 51 y.o. male who presents to the Emergency Department complaining of constant, gradually worsening lower back and bilateral leg pain from a fall in November that was seen here. Pt denies any new injuries or falls and states he needed assistance getting out of bed this morning. Pt denies any vomiting, stomach pain, chest pains, loss of control over bowels or bladder. He denies h/o of past falls.   Pt is a current everyday smoker but denies alcohol use.    Past Medical History  Diagnosis Date  . Thyroid disease     Past Surgical History  Procedure Date  . Cholecystectomy     History reviewed. No pertinent family history.  History  Substance Use Topics  . Smoking status: Current Every Day Smoker -- 1.0 packs/day for 37 years    Types: Cigarettes  . Smokeless tobacco: Never Used  . Alcohol Use: No      Review of Systems  Cardiovascular: Negative for chest pain.  Gastrointestinal: Negative for vomiting and abdominal pain.  Genitourinary: Negative for difficulty urinating.  Musculoskeletal: Positive for back pain.  All other systems reviewed and are negative.    Allergies  Codeine  Home Medications   Current Outpatient Rx  Name  Route  Sig  Dispense  Refill  . ASPIRIN 81 MG PO CHEW   Oral   Chew 81 mg by mouth every morning.          Marland Kitchen LEVOTHYROXINE SODIUM 200 MCG PO TABS   Oral   Take 200 mcg by mouth daily.         Marland Kitchen VITAMIN B-12 1000 MCG PO TABS   Oral   Take 1,000 mcg by mouth every morning.         Marland Kitchen VITAMIN E 1000 UNITS PO CAPS   Oral   Take 2,000 Units by mouth every morning.            BP 117/76  Pulse 72  Temp 98 F (36.7 C) (Oral)  Resp 18  Ht 6\' 1"  (1.854 m)  Wt 230 lb (104.327 kg)  BMI 30.34 kg/m2  SpO2 96%  Physical Exam  Nursing note and vitals reviewed. Constitutional: He is oriented to person, place, and time. He appears well-developed and well-nourished. No distress.       Appears uncomfortable.  HENT:  Head: Normocephalic and atraumatic.  Eyes: EOM are normal.  Neck: Normal range of motion. Neck supple. No tracheal deviation present.  Cardiovascular: Normal rate, regular rhythm and normal heart sounds.   Pulmonary/Chest: Effort normal and breath sounds normal. No respiratory distress. He exhibits no tenderness.  Abdominal: Soft. Bowel sounds are normal.  Genitourinary: Rectum normal.  Musculoskeletal: Normal range of motion. He exhibits tenderness.       5/5 strength in bilateral lower extremities. Ankle plantar and dorsiflexion intact. Great toe extension intact bilaterally. +2 DP and PT pulses. +2 patellar reflexes bilaterally. Normal gait.   Lumbar and midline  tenderness. Normal rectal tone.    Neurological: He is alert and oriented to person, place, and time.  Skin: Skin is warm and dry.  Psychiatric: He has a normal mood and affect. His behavior is normal.    ED Course  Procedures (including critical care time)  DIAGNOSTIC STUDIES: Oxygen Saturation is 96% on room air, adequate by my interpretation.    COORDINATION OF CARE:  5:32 PM Discussed treatment plan which includes pain medication with pt at bedside and pt agreed to plan.    Labs Reviewed  URINALYSIS, ROUTINE W REFLEX MICROSCOPIC - Abnormal; Notable for the following:    Urobilinogen, UA 2.0 (*)     All other components within normal limits   Dg Lumbar Spine Complete  06/13/2012  *RADIOLOGY REPORT*  Clinical Data: Back pain and leg pain.  The patient fell from a tree 12  days ago.  LUMBAR SPINE - COMPLETE 4+ VIEW  Comparison: Lumbar CT dated 06/05/2012  Findings: There is slight narrowing of the L5-S1 disc space, unchanged.  Slight degenerative changes of the facet joints at L4- 5.  There is no fracture or significant subluxation.  Slight anterior spurring at L1-2, unchanged.  IMPRESSION: No acute abnormality.  No change since 06/05/2012.   Original Report Authenticated By: Francene Boyers, M.D.      No diagnosis found.    MDM  Low back pain since fall from tree in November 19. Seen here. Denies weakness, numbness, tingling, bowel or bladder incontinence, fever or vomiting. Needed assistance to get out of bed today. Denies any new trauma.  Urinalysis negative. X-ray negative. Ambulatory in the ED without assistance.  No back pain red flags.  I personally performed the services described in this documentation, which was scribed in my presence. The recorded information has been reviewed and is accurate.       Glynn Octave, MD 06/13/12 2340

## 2012-06-13 NOTE — ED Notes (Signed)
Pt out to radiology

## 2012-06-13 NOTE — ED Notes (Signed)
Pt alert & oriented x4, stable gait. Patient given discharge instructions, paperwork & prescription(s). Patient  instructed to stop at the registration desk to finish any additional paperwork. Patient verbalized understanding. Pt left department w/ no further questions. 

## 2012-06-24 ENCOUNTER — Emergency Department (HOSPITAL_COMMUNITY): Payer: Self-pay

## 2012-06-24 ENCOUNTER — Encounter (HOSPITAL_COMMUNITY): Payer: Self-pay | Admitting: *Deleted

## 2012-06-24 ENCOUNTER — Emergency Department (HOSPITAL_COMMUNITY)
Admission: EM | Admit: 2012-06-24 | Discharge: 2012-06-24 | Disposition: A | Discharge status: Home / Self Care | Payer: Self-pay | Attending: Emergency Medicine | Admitting: Emergency Medicine

## 2012-06-24 DIAGNOSIS — Y9339 Activity, other involving climbing, rappelling and jumping off: Secondary | ICD-10-CM | POA: Insufficient documentation

## 2012-06-24 DIAGNOSIS — Z79899 Other long term (current) drug therapy: Secondary | ICD-10-CM | POA: Insufficient documentation

## 2012-06-24 DIAGNOSIS — F17200 Nicotine dependence, unspecified, uncomplicated: Secondary | ICD-10-CM | POA: Insufficient documentation

## 2012-06-24 DIAGNOSIS — M79609 Pain in unspecified limb: Secondary | ICD-10-CM | POA: Insufficient documentation

## 2012-06-24 DIAGNOSIS — E079 Disorder of thyroid, unspecified: Secondary | ICD-10-CM | POA: Insufficient documentation

## 2012-06-24 DIAGNOSIS — W19XXXS Unspecified fall, sequela: Secondary | ICD-10-CM | POA: Insufficient documentation

## 2012-06-24 DIAGNOSIS — M5431 Sciatica, right side: Secondary | ICD-10-CM

## 2012-06-24 DIAGNOSIS — Y929 Unspecified place or not applicable: Secondary | ICD-10-CM | POA: Insufficient documentation

## 2012-06-24 DIAGNOSIS — M5430 Sciatica, unspecified side: Secondary | ICD-10-CM | POA: Insufficient documentation

## 2012-06-24 DIAGNOSIS — X58XXXA Exposure to other specified factors, initial encounter: Secondary | ICD-10-CM | POA: Insufficient documentation

## 2012-06-24 DIAGNOSIS — M479 Spondylosis, unspecified: Secondary | ICD-10-CM | POA: Insufficient documentation

## 2012-06-24 DIAGNOSIS — M47816 Spondylosis without myelopathy or radiculopathy, lumbar region: Secondary | ICD-10-CM

## 2012-06-24 MED ORDER — OXYCODONE-ACETAMINOPHEN 5-325 MG PO TABS: 1.0000 | ORAL_TABLET | ORAL | Status: AC | PRN

## 2012-06-24 MED ORDER — PREDNISONE 50 MG PO TABS
60.0000 mg | ORAL_TABLET | Freq: Once | ORAL | Status: AC
  Administered 2012-06-24: 60 mg via ORAL
  Filled 2012-06-24: qty 1

## 2012-06-24 MED ORDER — HYDROMORPHONE HCL PF 1 MG/ML IJ SOLN
2.0000 mg | Freq: Once | INTRAMUSCULAR | Status: AC
  Administered 2012-06-24: 2 mg via INTRAMUSCULAR
  Filled 2012-06-24: qty 2

## 2012-06-24 MED ORDER — SODIUM CHLORIDE 0.9 % IV SOLN
INTRAVENOUS | Status: AC
  Filled 2012-06-24: qty 3

## 2012-06-24 MED ORDER — LORAZEPAM 2 MG/ML IJ SOLN
1.0000 mg | Freq: Once | INTRAMUSCULAR | Status: AC
  Administered 2012-06-24: 1 mg via INTRAVENOUS
  Filled 2012-06-24: qty 1

## 2012-06-24 MED ORDER — ONDANSETRON 8 MG PO TBDP
8.0000 mg | ORAL_TABLET | Freq: Once | ORAL | Status: AC
  Administered 2012-06-24: 8 mg via ORAL
  Filled 2012-06-24: qty 1

## 2012-06-24 MED ORDER — OXYCODONE-ACETAMINOPHEN 5-325 MG PO TABS
1.0000 | ORAL_TABLET | Freq: Once | ORAL | Status: AC
  Administered 2012-06-24: 1 via ORAL
  Filled 2012-06-24: qty 1

## 2012-06-24 MED ORDER — PREDNISONE 50 MG PO TABS: 50.0000 mg | ORAL_TABLET | Freq: Every day | ORAL | Status: DC

## 2012-06-24 NOTE — ED Notes (Signed)
Fell 20+ feet from tree 2 weeks ago and has been seen here and at [facility]. PT states pain keeps getting worse and has been unable to sleep. Pt states he needs a MRI.

## 2012-06-27 NOTE — ED Provider Notes (Signed)
History     CSN: [id number]  Arrival date & time 06/24/12  4098   First MD Initiated Contact with Patient 06/24/12 1015      Chief Complaint  Patient presents with  . Back Pain  . Leg Pain    (Consider location/radiation/quality/duration/timing/severity/associated sxs/prior treatment) HPI Comments: Jeremy Weeks  presents with continued ongoing low back pain which is now radiating into his bilateral lower extremities since he fell out of a tree last month.  His radiating pain is worse in the right leg,  Radiating to his heel, the left sided pain radiates to his knee level.  He was initially seen here for this injury at which time his xrays and Ct scans were negative for obvious injury.    Patient denies any new injury,  But states his pain is getting worse and he is having increased pain with movement secondary to pain.   There has been no weakness or numbness in the lower extremities and no urinary or bowel retention or incontinence.  Patient does not have a history of cancer or IVDU.   The history is provided by the patient.    Past Medical History  Diagnosis Date  . Thyroid disease     Past Surgical History  Procedure Date  . Cholecystectomy     No family history on file.  History  Substance Use Topics  . Smoking status: Current Every Day Smoker -- 1.0 packs/day for 37 years    Types: Cigarettes  . Smokeless tobacco: Never Used  . Alcohol Use: No      Review of Systems  Constitutional: Negative for fever.  Respiratory: Negative for shortness of breath.   Cardiovascular: Negative for chest pain and leg swelling.  Gastrointestinal: Negative for abdominal pain, constipation and abdominal distention.  Genitourinary: Negative for dysuria, urgency, frequency, flank pain and difficulty urinating.  Musculoskeletal: Positive for back pain. Negative for joint swelling and gait problem.  Skin: Negative for rash.  Neurological: Negative for weakness and numbness.     Allergies  Codeine  Home Medications   Current Outpatient Rx  Name  Route  Sig  Dispense  Refill  . LEVOTHYROXINE SODIUM 200 MCG PO TABS   Oral   Take 200 mcg by mouth daily.         . OXYCODONE-ACETAMINOPHEN 5-325 MG PO TABS   Oral   Take 1 tablet by mouth every 4 (four) hours as needed for pain.   24 tablet   0   . PREDNISONE 50 MG PO TABS   Oral   Take 1 tablet (50 mg total) by mouth daily.   7 tablet   0     BP 150/102  Pulse 78  Temp 98.7 F (37.1 C) (Oral)  Resp 20  Ht 6' (1.829 m)  Wt 225 lb (102.059 kg)  BMI 30.52 kg/m2  SpO2 100%  Physical Exam  Nursing note and vitals reviewed. Constitutional: He appears well-developed and well-nourished.  HENT:  Head: Normocephalic.  Eyes: Conjunctivae normal are normal.  Neck: Normal range of motion. Neck supple.  Cardiovascular: Normal rate and intact distal pulses.        Pedal pulses normal.  Pulmonary/Chest: Effort normal.  Abdominal: Soft. Bowel sounds are normal. He exhibits no distension and no mass.  Musculoskeletal: Normal range of motion. He exhibits no edema.       Lumbar back: He exhibits tenderness. He exhibits no swelling, no edema and no spasm.       Midline  and bilateral lumbar soft tissue ttp.  Right SI joint ttp.  Neurological: He is alert. He has normal strength. He displays no atrophy and no tremor. No sensory deficit. Gait normal.  Reflex Scores:      Patellar reflexes are 2+ on the right side and 2+ on the left side.      Achilles reflexes are 2+ on the right side and 2+ on the left side.      No strength deficit noted in hip and knee flexor and extensor muscle groups.  Ankle flexion and extension intact.  Skin: Skin is warm and dry.  Psychiatric: He has a normal mood and affect.    ED Course  Procedures (including critical care time)  Labs Reviewed - No data to display No results found.   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 Sciatica of right side        MDM  Mri results reviewed and discussed with pt.  No neuro deficit on exam or by history to suggest emergent or surgical presentation.  Also discussed worsened sx that should prompt immediate re-evaluation including distal weakness, bowel/bladder retention/incontinence.  Referral to Dr. Phoebe Perch for further management of injury.  Prednisone pulse dose , oxycodone prescribed.              Burgess Amor, [place] 06/27/12 [phone]

## 2012-07-05 NOTE — ED Provider Notes (Addendum)
Medical screening examination/treatment/procedure(s) were performed by non-physician practitioner and as supervising physician I was immediately available for consultation/collaboration.   Benny Lennert, MD            Medical screening examination/treatment/procedure(s) were performed by non-physician practitioner and as supervising physician I was immediately available for consultation/collaboration.  07/05/12 1037  Benny Lennert, MD 07/06/12 [phone]

## 2012-07-11 ENCOUNTER — Encounter (HOSPITAL_COMMUNITY): Payer: Self-pay

## 2012-07-11 ENCOUNTER — Emergency Department (HOSPITAL_COMMUNITY)
Admission: EM | Admit: 2012-07-11 | Discharge: 2012-07-11 | Disposition: A | Discharge status: Home / Self Care | Payer: Self-pay | Attending: Emergency Medicine | Admitting: Emergency Medicine

## 2012-07-11 DIAGNOSIS — Y939 Activity, unspecified: Secondary | ICD-10-CM | POA: Insufficient documentation

## 2012-07-11 DIAGNOSIS — E079 Disorder of thyroid, unspecified: Secondary | ICD-10-CM | POA: Insufficient documentation

## 2012-07-11 DIAGNOSIS — Y929 Unspecified place or not applicable: Secondary | ICD-10-CM | POA: Insufficient documentation

## 2012-07-11 DIAGNOSIS — M545 Low back pain: Secondary | ICD-10-CM

## 2012-07-11 DIAGNOSIS — W1789XA Other fall from one level to another, initial encounter: Secondary | ICD-10-CM | POA: Insufficient documentation

## 2012-07-11 DIAGNOSIS — F17200 Nicotine dependence, unspecified, uncomplicated: Secondary | ICD-10-CM | POA: Insufficient documentation

## 2012-07-11 DIAGNOSIS — IMO0002 Reserved for concepts with insufficient information to code with codable children: Secondary | ICD-10-CM | POA: Insufficient documentation

## 2012-07-11 DIAGNOSIS — Z79899 Other long term (current) drug therapy: Secondary | ICD-10-CM | POA: Insufficient documentation

## 2012-07-11 MED ORDER — OXYCODONE-ACETAMINOPHEN 5-325 MG PO TABS: ORAL_TABLET | ORAL | Status: DC

## 2012-07-11 MED ORDER — OXYCODONE-ACETAMINOPHEN 5-325 MG PO TABS
1.0000 | ORAL_TABLET | Freq: Once | ORAL | Status: AC
  Administered 2012-07-11: 1 via ORAL
  Filled 2012-07-11: qty 1

## 2012-07-11 NOTE — ED Provider Notes (Signed)
Medical screening examination/treatment/procedure(s) were performed by non-physician practitioner and as supervising physician I was immediately available for consultation/collaboration.  Viveca Beckstrom M Demira Gwynne, MD 07/11/12 2049 

## 2012-07-11 NOTE — ED Notes (Signed)
Pt had mri earlier this month, told has "spots on lumbar", given percocet for pain, is now out and unable to see md for 2 weeks.

## 2012-07-11 NOTE — ED Notes (Addendum)
Pt presents with c/o lower lumbar pain that has worsened since "running out of pain medication".  Pt radiates down rt leg. Pt is noted to be rapidly tapping rt leg on stretcher. Denies bowel and bladder dysfunction.  NAD noted Pedal and popliteal pulses equal bilaterally.

## 2012-07-11 NOTE — ED Provider Notes (Signed)
History     CSN: [id number]  Arrival date & time 07/11/12  1021   First MD Initiated Contact with Patient 07/11/12 1037      Chief Complaint  Patient presents with  . Back Pain    (Consider location/radiation/quality/duration/timing/severity/associated sxs/prior treatment) HPI Comments: Pt will be seeing dr. Regino Schultze for initial visit ~ 2 weeks.    Had recent MRI here.  Patient is a 51 y.o. male presenting with back pain. The history is provided by the patient. No language interpreter was used.  Back Pain  This is a new problem. Episode onset: > 1 month ago after falling out of a tree. The problem occurs constantly. The pain is associated with falling. The pain is present in the lumbar spine. The pain radiates to the right thigh. The pain is severe. The symptoms are aggravated by bending. Pertinent negatives include no fever, no numbness and no weakness. He has tried analgesics for the symptoms.    Past Medical History  Diagnosis Date  . Thyroid disease     Past Surgical History  Procedure Date  . Cholecystectomy     No family history on file.  History  Substance Use Topics  . Smoking status: Current Every Day Smoker -- 1.0 packs/day for 37 years    Types: Cigarettes  . Smokeless tobacco: Never Used  . Alcohol Use: No      Review of Systems  Constitutional: Negative for fever and chills.  Musculoskeletal: Positive for back pain.  Neurological: Negative for weakness and numbness.  All other systems reviewed and are negative.    Allergies  Codeine  Home Medications   Current Outpatient Rx  Name  Route  Sig  Dispense  Refill  . DIAZEPAM 5 MG PO TABS   Oral   Take 10 mg by mouth every 6 (six) hours as needed. Muscle spasms         . LEVOTHYROXINE SODIUM 200 MCG PO TABS   Oral   Take 200 mcg by mouth daily.         . OXYCODONE-ACETAMINOPHEN 5-325 MG PO TABS      One tab po q 6 hrs prn pain   2 tablet   0   . PREDNISONE 50 MG PO TABS   Oral  Take 1 tablet (50 mg total) by mouth daily.   7 tablet   0     BP 133/88  Pulse 80  Temp 98.3 F (36.8 C) (Oral)  Resp 20  SpO2 100%  Physical Exam  Nursing note and vitals reviewed. Constitutional: He is oriented to person, place, and time. He appears well-developed and well-nourished.  HENT:  Head: Normocephalic and atraumatic.  Eyes: EOM are normal.  Neck: Normal range of motion.  Cardiovascular: Normal rate, regular rhythm and intact distal pulses.   Pulmonary/Chest: Effort normal. No respiratory distress.  Abdominal: Soft. He exhibits no distension. There is no tenderness.  Musculoskeletal: He exhibits tenderness.       Lumbar back: He exhibits tenderness, bony tenderness and pain. He exhibits no swelling, no laceration, no spasm and normal pulse.       Back:  Neurological: He is alert and oriented to person, place, and time.  Skin: Skin is warm and dry.  Psychiatric: He has a normal mood and affect. Judgment normal.    ED Course  Procedures (including critical care time)  Labs Reviewed - No data to display No results found.   1. Low back pain  MDM  rx-percocet, 20 Ice F/u with  Dr. Regino Schultze as planned.        Evalina Field, [place] 07/11/12 1128

## 2012-07-14 HISTORY — PX: BACK SURGERY: SHX140

## 2012-10-04 ENCOUNTER — Emergency Department (HOSPITAL_COMMUNITY)
Admission: EM | Admit: 2012-10-04 | Discharge: 2012-10-04 | Disposition: A | Discharge status: Home / Self Care | Payer: BC Managed Care – PPO | Attending: Emergency Medicine | Admitting: Emergency Medicine

## 2012-10-04 ENCOUNTER — Emergency Department (HOSPITAL_COMMUNITY): Payer: BC Managed Care – PPO

## 2012-10-04 ENCOUNTER — Encounter (HOSPITAL_COMMUNITY): Payer: Self-pay

## 2012-10-04 DIAGNOSIS — IMO0002 Reserved for concepts with insufficient information to code with codable children: Secondary | ICD-10-CM | POA: Insufficient documentation

## 2012-10-04 DIAGNOSIS — S8990XA Unspecified injury of unspecified lower leg, initial encounter: Secondary | ICD-10-CM | POA: Insufficient documentation

## 2012-10-04 DIAGNOSIS — E079 Disorder of thyroid, unspecified: Secondary | ICD-10-CM | POA: Insufficient documentation

## 2012-10-04 DIAGNOSIS — Y990 Civilian activity done for income or pay: Secondary | ICD-10-CM | POA: Insufficient documentation

## 2012-10-04 DIAGNOSIS — M549 Dorsalgia, unspecified: Secondary | ICD-10-CM

## 2012-10-04 DIAGNOSIS — Y9289 Other specified places as the place of occurrence of the external cause: Secondary | ICD-10-CM | POA: Insufficient documentation

## 2012-10-04 DIAGNOSIS — Z7982 Long term (current) use of aspirin: Secondary | ICD-10-CM | POA: Insufficient documentation

## 2012-10-04 DIAGNOSIS — F17200 Nicotine dependence, unspecified, uncomplicated: Secondary | ICD-10-CM | POA: Insufficient documentation

## 2012-10-04 DIAGNOSIS — Z79899 Other long term (current) drug therapy: Secondary | ICD-10-CM | POA: Insufficient documentation

## 2012-10-04 DIAGNOSIS — Y939 Activity, unspecified: Secondary | ICD-10-CM | POA: Insufficient documentation

## 2012-10-04 DIAGNOSIS — S99929A Unspecified injury of unspecified foot, initial encounter: Secondary | ICD-10-CM | POA: Insufficient documentation

## 2012-10-04 DIAGNOSIS — M5431 Sciatica, right side: Secondary | ICD-10-CM

## 2012-10-04 DIAGNOSIS — M5430 Sciatica, unspecified side: Secondary | ICD-10-CM | POA: Insufficient documentation

## 2012-10-04 DIAGNOSIS — W010XXA Fall on same level from slipping, tripping and stumbling without subsequent striking against object, initial encounter: Secondary | ICD-10-CM | POA: Insufficient documentation

## 2012-10-04 MED ORDER — OXYCODONE-ACETAMINOPHEN 5-325 MG PO TABS: 2.0000 | ORAL_TABLET | ORAL | Status: DC | PRN

## 2012-10-04 MED ORDER — IBUPROFEN 800 MG PO TABS: 800.0000 mg | ORAL_TABLET | Freq: Three times a day (TID) | ORAL | Status: DC

## 2012-10-04 MED ORDER — KETOROLAC TROMETHAMINE 60 MG/2ML IM SOLN
60.0000 mg | Freq: Once | INTRAMUSCULAR | Status: AC
  Administered 2012-10-04: 60 mg via INTRAMUSCULAR
  Filled 2012-10-04: qty 2

## 2012-10-04 MED ORDER — DIAZEPAM 5 MG PO TABS
5.0000 mg | ORAL_TABLET | Freq: Once | ORAL | Status: AC
  Administered 2012-10-04: 5 mg via ORAL
  Filled 2012-10-04: qty 1

## 2012-10-04 MED ORDER — HYDROMORPHONE HCL PF 1 MG/ML IJ SOLN
1.0000 mg | Freq: Once | INTRAMUSCULAR | Status: AC
  Administered 2012-10-04: 1 mg via INTRAMUSCULAR
  Filled 2012-10-04: qty 1

## 2012-10-04 MED ORDER — METHYLPREDNISOLONE 4 MG PO KIT: PACK | ORAL | Status: DC

## 2012-10-04 NOTE — ED Provider Notes (Signed)
History     CSN: [id number]  Arrival date & time 10/04/12  1909   First MD Initiated Contact with Patient 10/04/12 1920      Chief Complaint  Patient presents with  . Fall  . Back Pain  . Leg Pain    (Consider location/radiation/quality/duration/timing/severity/associated sxs/prior treatment) HPI Comments: Patient went of low back pain after tripping and falling over some hoses in the garage. Landed on his low back. Denies hitting head or losing consciousness. Has pain in his low back radiating down both legs. Was able to get up on his own. Denies any bowel or bladder incontinence. No fevers or vomiting. No illicit drug use or cancer history. Has had similar back pain in the past after a fall last year. Denies any back pain every day. Do not take anything for it. Denies any abdominal pain, chest pain, head or neck pain.  The history is provided by the patient.    Past Medical History  Diagnosis Date  . Thyroid disease     Past Surgical History  Procedure Laterality Date  . Cholecystectomy      No family history on file.  History  Substance Use Topics  . Smoking status: Current Every Day Smoker -- 1.00 packs/day for 37 years    Types: Cigarettes  . Smokeless tobacco: Never Used  . Alcohol Use: No      Review of Systems  Constitutional: Negative for fever, activity change and appetite change.  HENT: Negative for congestion and rhinorrhea.   Respiratory: Negative for cough, chest tightness and shortness of breath.   Cardiovascular: Negative for chest pain.  Gastrointestinal: Negative for nausea, vomiting and abdominal pain.  Genitourinary: Negative for dysuria and hematuria.  Musculoskeletal: Positive for back pain. Negative for myalgias and arthralgias.  Skin: Negative for wound.  Neurological: Negative for dizziness, weakness and headaches.  A complete 10 system review of systems was obtained and all systems are negative except as noted in the HPI and PMH.     Allergies  Codeine  Home Medications   Current Outpatient Rx  Name  Route  Sig  Dispense  Refill  . aspirin EC 81 MG tablet   Oral   Take 81 mg by mouth every morning.         Marland Kitchen levothyroxine (SYNTHROID, LEVOTHROID) 75 MCG tablet   Oral   Take 75 mcg by mouth daily.         . vitamin B-12 (CYANOCOBALAMIN) 1000 MCG tablet   Oral   Take 1,000 mcg by mouth every morning.         . vitamin E (VITAMIN E) 400 UNIT capsule   Oral   Take 400 Units by mouth every morning.         Marland Kitchen ibuprofen (ADVIL,MOTRIN) 800 MG tablet   Oral   Take 1 tablet (800 mg total) by mouth 3 (three) times daily.   21 tablet   0   . methylPREDNISolone (MEDROL, PAK,) 4 MG tablet      follow package directions   21 tablet   0   . oxyCODONE-acetaminophen (PERCOCET/ROXICET) 5-325 MG per tablet   Oral   Take 2 tablets by mouth every 4 (four) hours as needed for pain.   15 tablet   0     BP 134/83  Pulse 84  Temp(Src) 97.3 F (36.3 C) (Oral)  Resp 20  Ht 6' (1.829 m)  Wt 240 lb (108.863 kg)  BMI 32.54 kg/m2  SpO2 97%  Physical Exam  Constitutional: He is oriented to person, place, and time. He appears well-developed and well-nourished. No distress.  uncomfortable  HENT:  Head: Normocephalic and atraumatic.  Mouth/Throat: Oropharynx is clear and moist. No oropharyngeal exudate.  Eyes: Conjunctivae and EOM are normal. Pupils are equal, round, and reactive to light.  Neck: Normal range of motion. Neck supple.  Cardiovascular: Normal rate, regular rhythm and normal heart sounds.   No murmur heard. Pulmonary/Chest: Effort normal and breath sounds normal. No respiratory distress.  Abdominal: Soft. There is no tenderness. There is no rebound and no guarding.  Musculoskeletal: Normal range of motion. He exhibits tenderness. He exhibits no edema.  TTP lumbar spine in midline without stepoff or deformity 5/5 strength in bilateral lower extremities. Ankle plantar and dorsiflexion  intact. Great toe extension intact bilaterally. +2 DP and PT pulses. +2 patellar reflexes bilaterally. Normal gait.    Neurological: He is alert and oriented to person, place, and time. No cranial nerve deficit. He exhibits normal muscle tone. Coordination normal.  Skin: Skin is warm.    ED Course  Procedures (including critical care time)  Labs Reviewed - No data to display Dg Lumbar Spine Complete  10/04/2012  *RADIOLOGY REPORT*  Clinical Data: Low back pain and bilateral leg pain since a fall today.  LUMBAR SPINE - COMPLETE 4+ VIEW  Comparison: Radiographs dated 06/13/2012  Findings: There is no acute fracture.  There is moderately severe bilateral facet arthritis at L4-5 with grade 1 spondylolisthesis, slightly increased.  There is slight disc space narrowing at L5-S1 and there is slight anterior spurring at L1-2.  IMPRESSION: No acute abnormalities.  Slight increased spondylolisthesis of L4- L5 secondary to facet arthritis.   Original Report Authenticated By: Francene Boyers, M.D.      1. Back pain   2. Sciatica, right       MDM  Low back pain with radiation to bilateral lower extremities after fall. Denies hitting head or losing consciousness. No focal weakness, numbness or tingling.  MRI 12/13: IMPRESSION: Degenerative changes most prominent L4-5 followed by the L5-S1 level as detailed above. Xray negative for acute pathology.  NO evidence of cauda equina or neurological red flags. Ambulatory in ED without assistance.   Patient is feeling improved. He states he is agreeable to discharge home with symptomatic control. Patient urged to return with worsening or persistent pain, persistent vomiting, fever, worsening of symptoms, or if he has any other concerns. Patient verbalizes understanding and agrees with plan. I urged the patient to followup with his primary care physician and surgery for further evaluation and management.           Glynn Octave, MD 10/04/12  2053

## 2012-10-04 NOTE — ED Notes (Signed)
Pt given water to drink. Pt ambulated around nurses station with nurse tech, c/o pain down both legs.

## 2012-10-04 NOTE — ED Notes (Signed)
Tripped and fell over some hoses in the garage at work. Landed on my tailbone, now having pain in my back and going down both sides of my legs per pt.

## 2013-05-11 ENCOUNTER — Encounter (HOSPITAL_COMMUNITY): Payer: Self-pay | Admitting: Emergency Medicine

## 2013-05-11 ENCOUNTER — Emergency Department (HOSPITAL_COMMUNITY)
Admission: EM | Admit: 2013-05-11 | Discharge: 2013-05-11 | Disposition: A | Discharge status: Home / Self Care | Payer: BC Managed Care – PPO | Attending: Emergency Medicine | Admitting: Emergency Medicine

## 2013-05-11 DIAGNOSIS — G8929 Other chronic pain: Secondary | ICD-10-CM | POA: Insufficient documentation

## 2013-05-11 DIAGNOSIS — E079 Disorder of thyroid, unspecified: Secondary | ICD-10-CM | POA: Insufficient documentation

## 2013-05-11 DIAGNOSIS — F17200 Nicotine dependence, unspecified, uncomplicated: Secondary | ICD-10-CM | POA: Insufficient documentation

## 2013-05-11 DIAGNOSIS — Z79899 Other long term (current) drug therapy: Secondary | ICD-10-CM | POA: Insufficient documentation

## 2013-05-11 DIAGNOSIS — Z7982 Long term (current) use of aspirin: Secondary | ICD-10-CM | POA: Insufficient documentation

## 2013-05-11 DIAGNOSIS — M545 Low back pain, unspecified: Secondary | ICD-10-CM | POA: Insufficient documentation

## 2013-05-11 MED ORDER — CYCLOBENZAPRINE HCL 10 MG PO TABS: 10.0000 mg | ORAL_TABLET | Freq: Two times a day (BID) | ORAL | Status: DC | PRN

## 2013-05-11 MED ORDER — OXYCODONE-ACETAMINOPHEN 5-325 MG PO TABS
2.0000 | ORAL_TABLET | Freq: Once | ORAL | Status: AC
  Administered 2013-05-11: 2 via ORAL
  Filled 2013-05-11: qty 2

## 2013-05-11 MED ORDER — TRAMADOL HCL 50 MG PO TABS: 50.0000 mg | ORAL_TABLET | Freq: Four times a day (QID) | ORAL | Status: DC | PRN

## 2013-05-11 NOTE — ED Notes (Signed)
Pt reports has chronic lower back pain and pain flared up after moving wood last night.  Says pain radiates down both legs.  No incontinence of bowel or bladder.

## 2013-05-11 NOTE — ED Provider Notes (Signed)
CSN: [id number]     Arrival date & time 05/11/13  1158 History   First MD Initiated Contact with Patient 05/11/13 1208     Chief Complaint  Patient presents with  . Back Pain   (Consider location/radiation/quality/duration/timing/severity/associated sxs/prior Treatment) Patient is a 52 y.o. male presenting with back pain. The history is provided by the patient.  Back Pain Location:  Lumbar spine Quality:  Aching Radiates to:  L thigh and R thigh Onset quality:  Gradual Duration:  12 hours Timing:  Constant Progression:  Worsening Chronicity:  Chronic Context: physical stress   Relieved by:  Nothing Worsened by:  Bending, standing, twisting and movement Ineffective treatments:  Ibuprofen and heating pad Associated symptoms: no bladder incontinence, no bowel incontinence, no chest pain, no dysuria, no fever and no weakness    NAM Jeremy Weeks is a 52 y.o. male who presents to the ED with low back pain. He was moving wood last night and developed lower back pain that goes down his legs. He has chronic back pain but lifting the wood last night made the pain flare up. The pain is more on the right side.  Past Medical History  Diagnosis Date  . Thyroid disease   . Back pain    Past Surgical History  Procedure Laterality Date  . Cholecystectomy     No family history on file. History  Substance Use Topics  . Smoking status: Current Every Day Smoker -- 1.00 packs/day for 37 years    Types: Cigarettes  . Smokeless tobacco: Never Used  . Alcohol Use: No    Review of Systems  Constitutional: Negative for fever and chills.  HENT: Negative.   Respiratory: Negative for chest tightness.   Cardiovascular: Negative for chest pain.  Gastrointestinal: Negative for nausea, vomiting and bowel incontinence.  Genitourinary: Negative for bladder incontinence, dysuria, urgency and frequency.  Musculoskeletal: Positive for back pain.  Skin: Negative for wound.  Allergic/Immunologic: Negative  for immunocompromised state.  Neurological: Negative for weakness.  Psychiatric/Behavioral: The patient is not nervous/anxious.     Allergies  Codeine  Home Medications   Current Outpatient Rx  Name  Route  Sig  Dispense  Refill  . aspirin EC 81 MG tablet   Oral   Take 81 mg by mouth every morning.         Marland Kitchen ibuprofen (ADVIL,MOTRIN) 800 MG tablet   Oral   Take 800 mg by mouth 3 (three) times daily as needed for pain.         Marland Kitchen levothyroxine (SYNTHROID, LEVOTHROID) 88 MCG tablet   Oral   Take 88 mcg by mouth daily before breakfast.         . vitamin B-12 (CYANOCOBALAMIN) 1000 MCG tablet   Oral   Take 1,000 mcg by mouth every morning.         . vitamin E (VITAMIN E) 400 UNIT capsule   Oral   Take 400 Units by mouth every morning.          BP 140/95  Pulse 81  Temp(Src) 97.8 F (36.6 C) (Oral)  Resp 18  Ht 6' (1.829 m)  Wt 266 lb (120.657 kg)  BMI 36.07 kg/m2  SpO2 96% Physical Exam  Nursing note and vitals reviewed. Constitutional: He is oriented to person, place, and time. He appears well-developed and well-nourished. No distress.  HENT:  Head: Normocephalic and atraumatic.  Eyes: Conjunctivae and EOM are normal.  Neck: Normal range of motion. Neck supple.  Cardiovascular: Normal  rate, regular rhythm and normal heart sounds.   Pulmonary/Chest: Effort normal and breath sounds normal.  Abdominal: Soft. Bowel sounds are normal. There is no tenderness.  Musculoskeletal:       Lumbar back: He exhibits decreased range of motion, tenderness and spasm.       Back:  Pedal pulses equal bilateral. Adequate circulation, good touch sensation. Pain with straight leg raises. Ambulatory without foot drag.  Neurological: He is alert and oriented to person, place, and time. No cranial nerve deficit.  Skin: Skin is warm and dry.  Psychiatric: He has a normal mood and affect. His behavior is normal.    ED Course  Procedures  MDM  52 y.o. male with low back pain  after lifting wood last night. Will treat pain and muscle spasm. He is stable for discharge home without any immediate complications.  Discussed with the patient clinical findings and plan of care. All questioned fully answered. He will return if any problems arise.    Medication List    TAKE these medications       cyclobenzaprine 10 MG tablet  Commonly known as:  FLEXERIL  Take 1 tablet (10 mg total) by mouth 2 (two) times daily as needed for muscle spasms.     traMADol 50 MG tablet  Commonly known as:  ULTRAM  Take 1 tablet (50 mg total) by mouth every 6 (six) hours as needed for pain.      ASK your doctor about these medications       aspirin EC 81 MG tablet  Take 81 mg by mouth every morning.     ibuprofen 800 MG tablet  Commonly known as:  ADVIL,MOTRIN  Take 800 mg by mouth 3 (three) times daily as needed for pain.     levothyroxine 88 MCG tablet  Commonly known as:  SYNTHROID, LEVOTHROID  Take 88 mcg by mouth daily before breakfast.     vitamin B-12 1000 MCG tablet  Commonly known as:  CYANOCOBALAMIN  Take 1,000 mcg by mouth every morning.     vitamin E 400 UNIT capsule  Generic drug:  vitamin E  Take 400 Units by mouth every morning.           [facility] Orlene Och, [place] 05/12/13 [phone]

## 2013-05-13 NOTE — ED Provider Notes (Signed)
Medical screening examination/treatment/procedure(s) were performed by non-physician practitioner and as supervising physician I was immediately available for consultation/collaboration.  EKG Interpretation   None         Benny Lennert, MD 05/13/13 1348

## 2014-05-15 ENCOUNTER — Emergency Department (HOSPITAL_COMMUNITY)
Admission: EM | Admit: 2014-05-15 | Discharge: 2014-05-15 | Disposition: A | Discharge status: Home / Self Care | Payer: Medicaid Other | Attending: Emergency Medicine | Admitting: Emergency Medicine

## 2014-05-15 ENCOUNTER — Encounter (HOSPITAL_COMMUNITY): Payer: Self-pay | Admitting: Emergency Medicine

## 2014-05-15 DIAGNOSIS — Z79899 Other long term (current) drug therapy: Secondary | ICD-10-CM | POA: Diagnosis not present

## 2014-05-15 DIAGNOSIS — E079 Disorder of thyroid, unspecified: Secondary | ICD-10-CM | POA: Insufficient documentation

## 2014-05-15 DIAGNOSIS — M549 Dorsalgia, unspecified: Secondary | ICD-10-CM | POA: Diagnosis present

## 2014-05-15 DIAGNOSIS — Z720 Tobacco use: Secondary | ICD-10-CM | POA: Insufficient documentation

## 2014-05-15 DIAGNOSIS — M545 Low back pain, unspecified: Secondary | ICD-10-CM

## 2014-05-15 DIAGNOSIS — Z7982 Long term (current) use of aspirin: Secondary | ICD-10-CM | POA: Insufficient documentation

## 2014-05-15 MED ORDER — HYDROMORPHONE HCL 1 MG/ML IJ SOLN
1.0000 mg | Freq: Once | INTRAMUSCULAR | Status: AC
  Administered 2014-05-15: 1 mg via INTRAMUSCULAR
  Filled 2014-05-15: qty 1

## 2014-05-15 MED ORDER — IBUPROFEN 400 MG PO TABS
600.0000 mg | ORAL_TABLET | Freq: Once | ORAL | Status: AC
  Administered 2014-05-15: 600 mg via ORAL
  Filled 2014-05-15 (×2): qty 1

## 2014-05-15 NOTE — Discharge Instructions (Signed)
Please follow directions provided. Be sure to keep your appointment with the pain clinic as scheduled tomorrow. Please try to arrange someone to pick up your medicines from home to bring to you all you were visiting in the hospital. Don't hesitate to return for new, worsening, or concerning symptoms.  SEEK IMMEDIATE MEDICAL CARE IF:  You have pain that radiates from your back into your legs.  You develop new bowel or bladder control problems.  You have unusual weakness or numbness in your arms or legs.  You develop nausea or vomiting.  You develop abdominal pain.  You feel faint.

## 2014-05-15 NOTE — ED Notes (Signed)
Pt c/o chronic lower back pain that is worse today due to having to be at hospital for brothers operation; pt sts has appt with pain clinic tomorrow

## 2014-05-15 NOTE — ED Notes (Signed)
Declined W/C at D/C and was escorted to lobby by RN. 

## 2014-05-15 NOTE — ED Provider Notes (Signed)
CSN: [id number]     Arrival date & time 05/15/14  0813 History   First MD Initiated Contact with Patient 05/15/14 [phone]     Chief Complaint  Patient presents with  . Back Pain   (Consider location/radiation/quality/duration/timing/severity/associated sxs/prior Treatment) HPI  Jeremy Weeks is a 53 yo male presenting with back pain that began about 1 hr ago.  He reports he is waiting in the OR waiting room while his brother has surgery.  He left his pain meds at home and will not be able to get them until tonight.  He has an appointment tomorrow to establish care with a pain specialist, but has not seen them yet.  This is the same recurrent pain he has had since his back surgery in May of this year.  He denies any new injury, fevers, saddle parasthesia, IVDU, pain worse at night or incontinence of bowel or bladder  Past Medical History  Diagnosis Date  . Thyroid disease   . Back pain    Past Surgical History  Procedure Laterality Date  . Cholecystectomy     History reviewed. No pertinent family history. History  Substance Use Topics  . Smoking status: Current Every Day Smoker -- 1.00 packs/day for 37 years    Types: Cigarettes  . Smokeless tobacco: Never Used  . Alcohol Use: No    Review of Systems  Constitutional: Negative for fever.  Musculoskeletal: Positive for back pain. Negative for joint swelling and neck stiffness.  Skin: Negative for rash.  Neurological: Negative for dizziness and numbness.    Allergies  Codeine  Home Medications   Prior to Admission medications   Medication Sig Start Date End Date Taking? Authorizing Provider  aspirin EC 81 MG tablet Take 81 mg by mouth every morning.    Historical Provider, MD  cyclobenzaprine (FLEXERIL) 10 MG tablet Take 1 tablet (10 mg total) by mouth 2 (two) times daily as needed for muscle spasms. 05/11/13   Hope Orlene OchM Neese, NP  ibuprofen (ADVIL,MOTRIN) 800 MG tablet Take 800 mg by mouth 3 (three) times daily as needed for  pain. 10/04/12   Glynn OctaveStephen Rancour, MD  levothyroxine (SYNTHROID, LEVOTHROID) 88 MCG tablet Take 88 mcg by mouth daily before breakfast.    Historical Provider, MD  traMADol (ULTRAM) 50 MG tablet Take 1 tablet (50 mg total) by mouth every 6 (six) hours as needed for pain. 05/11/13   Hope Orlene OchM Neese, NP  vitamin B-12 (CYANOCOBALAMIN) 1000 MCG tablet Take 1,000 mcg by mouth every morning.    Historical Provider, MD  vitamin E (VITAMIN E) 400 UNIT capsule Take 400 Units by mouth every morning.    Historical Provider, MD   BP 140/103 mmHg  Pulse 83  Temp(Src) 97.4 F (36.3 C) (Oral)  Resp 18  SpO2 99% Physical Exam  Constitutional: He is oriented to person, place, and time. He appears well-developed and well-nourished. No distress.  HENT:  Head: Normocephalic.  Eyes: Conjunctivae are normal. Pupils are equal, round, and reactive to light. No scleral icterus.  Cardiovascular: Intact distal pulses.   Pulmonary/Chest: Effort normal.  Musculoskeletal:       Back:  Neurological: He is alert and oriented to person, place, and time. He has normal strength. No cranial nerve deficit or sensory deficit. Coordination normal. GCS eye subscore is 4. GCS verbal subscore is 5. GCS motor subscore is 6.  Reflex Scores:      Patellar reflexes are 2+ on the right side and 2+ on the left side.  Skin: Skin is warm and dry. He is not diaphoretic.  Nursing note and vitals reviewed.   ED Course  Procedures (including critical care time) Labs Review Labs Reviewed - No data to display  Imaging Review No results found.   EKG Interpretation None      MDM   Final diagnoses:  Bilateral low back pain without sciatica   53 yo male presenting with recurrent back pain.  No neurological deficits and normal neuro exam.  Patient uses a cane to walk and there is no change in his gait.  No loss of bowel or bladder control.  No concern for cauda equina.  No fever, night sweats, weight loss, h/o cancer, IVDU.  Will treat  with pain meds here as he is visiting his brother during surgery but no prescriptions are needed. Pt has a scheduled appointment with his pain specialist. Pt aware of plan and in agreement. Return precautions provided.   Filed Vitals:   05/15/14 0826 05/15/14 0938  BP: 140/103 140/92  Pulse: 83 76  Temp: 97.4 F (36.3 C) 98.4 F (36.9 C)  TempSrc: Oral Oral  Resp: 18 18  SpO2: 99% 99%   Meds given in ED:  Medications  ibuprofen (ADVIL,MOTRIN) tablet 600 mg (600 mg Oral Given 05/15/14 0901)  HYDROmorphone (DILAUDID) injection 1 mg (1 mg Intramuscular Given 05/15/14 0900)    New Prescriptions   No medications on file       Harle BattiestElizabeth Teddi Badalamenti, NP 05/15/14 [phone]

## 2014-05-15 NOTE — ED Notes (Signed)
Patient states his doctor is referring him to a pain clinic.   Patient states has not been yet.   Patient states he still has some percocet to take, but states did not take this morning and didn't bring with him.

## 2015-01-18 ENCOUNTER — Emergency Department (HOSPITAL_COMMUNITY): Payer: Medicaid Other

## 2015-01-18 ENCOUNTER — Encounter (HOSPITAL_COMMUNITY): Payer: Self-pay | Admitting: Emergency Medicine

## 2015-01-18 ENCOUNTER — Emergency Department (HOSPITAL_COMMUNITY)
Admission: EM | Admit: 2015-01-18 | Discharge: 2015-01-18 | Disposition: A | Discharge status: Home / Self Care | Payer: Medicaid Other | Attending: Emergency Medicine | Admitting: Emergency Medicine

## 2015-01-18 DIAGNOSIS — Z7982 Long term (current) use of aspirin: Secondary | ICD-10-CM | POA: Diagnosis not present

## 2015-01-18 DIAGNOSIS — Z720 Tobacco use: Secondary | ICD-10-CM | POA: Diagnosis not present

## 2015-01-18 DIAGNOSIS — R531 Weakness: Secondary | ICD-10-CM | POA: Diagnosis not present

## 2015-01-18 DIAGNOSIS — F419 Anxiety disorder, unspecified: Secondary | ICD-10-CM | POA: Diagnosis not present

## 2015-01-18 DIAGNOSIS — Z79899 Other long term (current) drug therapy: Secondary | ICD-10-CM | POA: Diagnosis not present

## 2015-01-18 DIAGNOSIS — E079 Disorder of thyroid, unspecified: Secondary | ICD-10-CM | POA: Diagnosis not present

## 2015-01-18 DIAGNOSIS — R5383 Other fatigue: Secondary | ICD-10-CM | POA: Diagnosis present

## 2015-01-18 LAB — COMPREHENSIVE METABOLIC PANEL
ALK PHOS: 48 U/L (ref 38–126)
ALT: 16 U/L — AB (ref 17–63)
AST: 20 U/L (ref 15–41)
Albumin: 4.1 g/dL (ref 3.5–5.0)
Anion gap: 9 (ref 5–15)
BILIRUBIN TOTAL: 1.5 mg/dL — AB (ref 0.3–1.2)
BUN: 19 mg/dL (ref 6–20)
CALCIUM: 9 mg/dL (ref 8.9–10.3)
CHLORIDE: 105 mmol/L (ref 101–111)
CO2: 24 mmol/L (ref 22–32)
Creatinine, Ser: 1.09 mg/dL (ref 0.61–1.24)
GLUCOSE: 99 mg/dL (ref 65–99)
Potassium: 3.4 mmol/L — ABNORMAL LOW (ref 3.5–5.1)
SODIUM: 138 mmol/L (ref 135–145)
Total Protein: 7.6 g/dL (ref 6.5–8.1)

## 2015-01-18 LAB — URINALYSIS, ROUTINE W REFLEX MICROSCOPIC
BILIRUBIN URINE: NEGATIVE
Glucose, UA: NEGATIVE mg/dL
HGB URINE DIPSTICK: NEGATIVE
Ketones, ur: NEGATIVE mg/dL
Leukocytes, UA: NEGATIVE
Nitrite: NEGATIVE
PH: 6 (ref 5.0–8.0)
Protein, ur: NEGATIVE mg/dL
SPECIFIC GRAVITY, URINE: 1.025 (ref 1.005–1.030)
UROBILINOGEN UA: 4 mg/dL — AB (ref 0.0–1.0)

## 2015-01-18 LAB — CBC WITH DIFFERENTIAL/PLATELET
BASOS ABS: 0.1 10*3/uL (ref 0.0–0.1)
Basophils Relative: 1 % (ref 0–1)
Eosinophils Absolute: 0.1 10*3/uL (ref 0.0–0.7)
Eosinophils Relative: 1 % (ref 0–5)
HCT: 47.2 % (ref 39.0–52.0)
HEMOGLOBIN: 16.5 g/dL (ref 13.0–17.0)
LYMPHS PCT: 41 % (ref 12–46)
Lymphs Abs: 4.3 10*3/uL — ABNORMAL HIGH (ref 0.7–4.0)
MCH: 33.7 pg (ref 26.0–34.0)
MCHC: 35 g/dL (ref 30.0–36.0)
MCV: 96.5 fL (ref 78.0–100.0)
MONO ABS: 0.7 10*3/uL (ref 0.1–1.0)
Monocytes Relative: 7 % (ref 3–12)
NEUTROS ABS: 5.4 10*3/uL (ref 1.7–7.7)
Neutrophils Relative %: 50 % (ref 43–77)
Platelets: 152 10*3/uL (ref 150–400)
RBC: 4.89 MIL/uL (ref 4.22–5.81)
RDW: 13.3 % (ref 11.5–15.5)
WBC: 10.5 10*3/uL (ref 4.0–10.5)

## 2015-01-18 LAB — TYPE AND SCREEN
ABO/RH(D): A POS
ANTIBODY SCREEN: NEGATIVE

## 2015-01-18 LAB — LACTATE DEHYDROGENASE: LDH: 129 U/L (ref 98–192)

## 2015-01-18 LAB — PROTIME-INR
INR: 1.16 (ref 0.00–1.49)
Prothrombin Time: 15 seconds (ref 11.6–15.2)

## 2015-01-18 LAB — RETICULOCYTES
RBC.: 4.91 MIL/uL (ref 4.22–5.81)
Retic Count, Absolute: 34.4 10*3/uL (ref 19.0–186.0)
Retic Ct Pct: 0.7 % (ref 0.4–3.1)

## 2015-01-18 LAB — POC OCCULT BLOOD, ED: FECAL OCCULT BLD: NEGATIVE

## 2015-01-18 LAB — TROPONIN I

## 2015-01-18 MED ORDER — KETOROLAC TROMETHAMINE 30 MG/ML IJ SOLN
30.0000 mg | Freq: Once | INTRAMUSCULAR | Status: AC
  Administered 2015-01-18: 30 mg via INTRAVENOUS
  Filled 2015-01-18: qty 1

## 2015-01-18 MED ORDER — SODIUM CHLORIDE 0.9 % IV BOLUS (SEPSIS)
1000.0000 mL | Freq: Once | INTRAVENOUS | Status: AC
  Administered 2015-01-18: 1000 mL via INTRAVENOUS

## 2015-01-18 MED ORDER — OXYCODONE-ACETAMINOPHEN 5-325 MG PO TABS
1.0000 | ORAL_TABLET | Freq: Once | ORAL | Status: AC
  Administered 2015-01-18: 1 via ORAL
  Filled 2015-01-18: qty 1

## 2015-01-18 NOTE — ED Notes (Signed)
MD Rancour at bedside 

## 2015-01-18 NOTE — ED Provider Notes (Signed)
CSN: [id number]     Arrival date & time 01/18/15  1138 History  This chart was scribed for Glynn Octave, MD by Placido Sou, ED scribe. This patient was seen in room APA07/APA07 and the patient's care was started at 12:19 PM.    Chief Complaint  Patient presents with  . Fatigue    The history is provided by the patient. No language interpreter was used.   HPI Comments: Jeremy Weeks is a 54 y.o. male who presents to the Emergency Department complaining of constant, moderate, generalized, internal pain with onset a few days ago. He notes associated black stools and diarrhea 7x since this AM, mild abd pain, and dizziness. Pt notes his PCP said his "red blood cells were being eaten by his white cells" and he currently describes his pain as "his bones feeling like they're on fire". He notes barely being able to ambulate and further notes currently taking Xanax 2x per day, Percocet 3x per day, and low dosage ibuprofen in addition to his other regular prescriptions. He denies currently taking any blood thinners. Pt notes his PCP referred him to a hematologist and he has an appointment scheduled for early next week. He notes a SHx of cholecystectomy and a spinal surgery. He denies any CP, SOB, nausea, and vomiting.    Past Medical History  Diagnosis Date  . Thyroid disease   . Back pain    Past Surgical History  Procedure Laterality Date  . Cholecystectomy    . Back surgery     History reviewed. No pertinent family history. History  Substance Use Topics  . Smoking status: Current Every Day Smoker -- 1.00 packs/day for 37 years    Types: Cigarettes  . Smokeless tobacco: Never Used  . Alcohol Use: No    Review of Systems  A complete 10 system review of systems was obtained and all systems are negative except as noted in the HPI and PMH.    Allergies  Codeine  Home Medications   Prior to Admission medications   Medication Sig Start Date End Date Taking? Authorizing Provider   acetaminophen (TYLENOL) 500 MG tablet Take 1,000 mg by mouth every 6 (six) hours as needed for moderate pain.   Yes Historical Provider, MD  ALPRAZolam Prudy Feeler) 1 MG tablet Take 1 mg by mouth 2 (two) times daily.   Yes Historical Provider, MD  aspirin EC 81 MG tablet Take 81 mg by mouth every morning.   Yes Historical Provider, MD  ibuprofen (ADVIL,MOTRIN) 800 MG tablet Take 800 mg by mouth 3 (three) times daily as needed for pain. 10/04/12  Yes Glynn Octave, MD  levothyroxine (SYNTHROID, LEVOTHROID) 112 MCG tablet Take 112 mcg by mouth daily before breakfast.   Yes Historical Provider, MD  oxyCODONE-acetaminophen (PERCOCET) 10-325 MG per tablet Take 1 tablet by mouth 3 (three) times daily as needed for pain.   Yes Historical Provider, MD  vitamin B-12 (CYANOCOBALAMIN) 1000 MCG tablet Take 1,000 mcg by mouth every morning.   Yes Historical Provider, MD  vitamin E (VITAMIN E) 400 UNIT capsule Take 400 Units by mouth every morning.   Yes Historical Provider, MD  zolpidem (AMBIEN) 10 MG tablet Take 10 mg by mouth at bedtime as needed for sleep.   Yes Historical Provider, MD  cyclobenzaprine (FLEXERIL) 10 MG tablet Take 1 tablet (10 mg total) by mouth 2 (two) times daily as needed for muscle spasms. Patient not taking: Reported on 01/18/2015 05/11/13   Janne Napoleon, NP  traMADol (ULTRAM) 50 MG tablet Take 1 tablet (50 mg total) by mouth every 6 (six) hours as needed for pain. Patient not taking: Reported on 01/18/2015 05/11/13   Janne NapoleonHope M Neese, NP   BP 123/79 mmHg  Pulse 64  Temp(Src) 97.5 F (36.4 C) (Oral)  Resp 18  Ht 6' (1.829 m)  Wt 200 lb (90.719 kg)  BMI 27.12 kg/m2  SpO2 99% Physical Exam  Constitutional: He is oriented to person, place, and time. He appears well-developed and well-nourished. No distress.  Appear anxious  HENT:  Head: Normocephalic and atraumatic.  Mouth/Throat: Oropharynx is clear and moist. No oropharyngeal exudate.  questionable juandice;   Eyes: Conjunctivae and  EOM are normal. Pupils are equal, round, and reactive to light.  Neck: Normal range of motion. Neck supple.  No meningismus.  Cardiovascular: Normal rate, regular rhythm, normal heart sounds and intact distal pulses.   No murmur heard. Pulmonary/Chest: Effort normal and breath sounds normal. No respiratory distress.  Abdominal: Soft. There is no tenderness. There is no rebound and no guarding.  Rectal exam shows no gross blood; brown stool  Musculoskeletal: Normal range of motion. He exhibits no edema or tenderness.  Neurological: He is alert and oriented to person, place, and time. No cranial nerve deficit. He exhibits normal muscle tone. Coordination normal.  No ataxia on finger to nose bilaterally. No pronator drift. 5/5 strength throughout. CN 2-12 intact. Negative Romberg. Equal grip strength. Sensation intact. Gait is normal.   Skin: Skin is warm.  Psychiatric: He has a normal mood and affect. His behavior is normal.  Nursing note and vitals reviewed.   ED Course  Procedures  DIAGNOSTIC STUDIES: Oxygen Saturation is 97% on RA, normal by my interpretation.    COORDINATION OF CARE: 12:27 PM Discussed treatment plan with pt at bedside and pt agreed to plan.  Labs Review Labs Reviewed  CBC WITH DIFFERENTIAL/PLATELET - Abnormal; Notable for the following:    Lymphs Abs 4.3 (*)    All other components within normal limits  COMPREHENSIVE METABOLIC PANEL - Abnormal; Notable for the following:    Potassium 3.4 (*)    ALT 16 (*)    Total Bilirubin 1.5 (*)    All other components within normal limits  URINALYSIS, ROUTINE W REFLEX MICROSCOPIC (NOT AT [facility]) - Abnormal; Notable for the following:    Urobilinogen, UA 4.0 (*)    All other components within normal limits  PROTIME-INR  TROPONIN I  LACTATE DEHYDROGENASE  RETICULOCYTES  POC OCCULT BLOOD, ED  TYPE AND SCREEN    Imaging Review Dg Chest 2 View  01/18/2015   CLINICAL DATA:  Weakness and diarrhea since this morning.  EXAM:  CHEST  2 VIEW  COMPARISON:  11/25/2013  FINDINGS: The heart size and mediastinal contours are within normal limits. Both lungs are clear. The visualized skeletal structures are unremarkable.  IMPRESSION: Normal chest.   Electronically Signed   By: Francene BoyersJames  Maxwell M.D.   On: 01/18/2015 14:15     EKG Interpretation   Date/Time:  Thursday January 18 2015 13:52:46 EDT Ventricular Rate:  57 PR Interval:  149 QRS Duration: 102 QT Interval:  460 QTC Calculation: 448 R Axis:   -22 Text Interpretation:  Sinus rhythm Borderline left axis deviation No  significant change was found Confirmed by Manus GunningANCOUR  MD, Letti Towell [phone]([id number]) on  01/18/2015 1:56:37 PM      MDM   Final diagnoses:  Generalized weakness   patient complains of generalized weakness and "burning in  my bones" ongoing for the past several days. States his PCP told him his "red cells are being eaten by my white cells" he is scheduled to see hematology next week. He endorses having black diarrhea today. No chest pain or shortness of breath.  Hemoglobin 16.5. Hemoccult negative. LFTs normal. Platelets normal.  Discussed with [facility] Kefalas PAC. he has not seen patient yet but states the referral was for thrombocytopenia. Patient with no evidence of thrombocytopenia or hemolysis on labs today.  Orthostatics negative. EKG normal sinus rhythm. Labs show no evidence of hemolysis. Bilirubin 1.5. LDH is normal. Reticulocytes are normal. Heme occult is negative.  No evidence of significant anemia or hemolysis. No significant thrombocytopenia. Patient appears stable for discharge and follow-up as scheduled next week with hematology.  BP 123/79 mmHg  Pulse 64  Temp(Src) 97.5 F (36.4 C) (Oral)  Resp 18  Ht 6' (1.829 m)  Wt 200 lb (90.719 kg)  BMI 27.12 kg/m2  SpO2 99%    I personally performed the services described in this documentation, which was scribed in my presence. The recorded information has been reviewed and is  accurate.    Glynn Octave, MD 01/18/15 [phone]

## 2015-01-18 NOTE — ED Notes (Signed)
Pt states that he recently dx with blood disorder and has appointment to be seen by oncologist however today woke up feeling worst, vomiting . Pt became upset in triage - stating  That there is something wrong with him and he wants to know why

## 2015-01-18 NOTE — ED Notes (Signed)
Pt reminded that a urine sample was needed.  

## 2015-01-18 NOTE — ED Notes (Signed)
Phlebotomy at bedside.

## 2015-01-18 NOTE — ED Notes (Signed)
Gave pt water to drink. Pt tolerating well at this time.  

## 2015-01-18 NOTE — ED Notes (Signed)
Pt requesting a Percocet for his chronic pain. Pt states he takes Percocet at home. MD Rancour made aware.

## 2015-01-18 NOTE — ED Notes (Signed)
MD Rancour at bedside updating patient.  

## 2015-01-18 NOTE — Discharge Instructions (Signed)
Fatigue\ Keep your appointment with hematologist next week. Return to the ED if you develop new or worsening symptoms. Fatigue is a feeling of tiredness, lack of energy, lack of motivation, or feeling tired all the time. Having enough rest, good nutrition, and reducing stress will normally reduce fatigue. Consult your caregiver if it persists. The nature of your fatigue will help your caregiver to find out its cause. The treatment is based on the cause.  CAUSES  There are many causes for fatigue. Most of the time, fatigue can be traced to one or more of your habits or routines. Most causes fit into one or more of three general areas. They are: Lifestyle problems  Sleep disturbances.  Overwork.  Physical exertion.  Unhealthy habits.  Poor eating habits or eating disorders.  Alcohol and/or drug use .  Lack of proper nutrition (malnutrition). Psychological problems  Stress and/or anxiety problems.  Depression.  Grief.  Boredom. Medical Problems or Conditions  Anemia.  Pregnancy.  Thyroid gland problems.  Recovery from major surgery.  Continuous pain.  Emphysema or asthma that is not well controlled  Allergic conditions.  Diabetes.  Infections (such as mononucleosis).  Obesity.  Sleep disorders, such as sleep apnea.  Heart failure or other heart-related problems.  Cancer.  Kidney disease.  Liver disease.  Effects of certain medicines such as antihistamines, cough and cold remedies, prescription pain medicines, heart and blood pressure medicines, drugs used for treatment of cancer, and some antidepressants. SYMPTOMS  The symptoms of fatigue include:   Lack of energy.  Lack of drive (motivation).  Drowsiness.  Feeling of indifference to the surroundings. DIAGNOSIS  The details of how you feel help guide your caregiver in finding out what is causing the fatigue. You will be asked about your present and past health condition. It is important to review  all medicines that you take, including prescription and non-prescription items. A thorough exam will be done. You will be questioned about your feelings, habits, and normal lifestyle. Your caregiver may suggest blood tests, urine tests, or other tests to look for common medical causes of fatigue.  TREATMENT  Fatigue is treated by correcting the underlying cause. For example, if you have continuous pain or depression, treating these causes will improve how you feel. Similarly, adjusting the dose of certain medicines will help in reducing fatigue.  HOME CARE INSTRUCTIONS   Try to get the required amount of good sleep every night.  Eat a healthy and nutritious diet, and drink enough water throughout the day.  Practice ways of relaxing (including yoga or meditation).  Exercise regularly.  Make plans to change situations that cause stress. Act on those plans so that stresses decrease over time. Keep your work and personal routine reasonable.  Avoid street drugs and minimize use of alcohol.  Start taking a daily multivitamin after consulting your caregiver. SEEK MEDICAL CARE IF:   You have persistent tiredness, which cannot be accounted for.  You have fever.  You have unintentional weight loss.  You have headaches.  You have disturbed sleep throughout the night.  You are feeling sad.  You have constipation.  You have dry skin.  You have gained weight.  You are taking any new or different medicines that you suspect are causing fatigue.  You are unable to sleep at night.  You develop any unusual swelling of your legs or other parts of your body. SEEK IMMEDIATE MEDICAL CARE IF:   You are feeling confused.  Your vision is blurred.  You feel faint or pass out.  You develop severe headache.  You develop severe abdominal, pelvic, or back pain.  You develop chest pain, shortness of breath, or an irregular or fast heartbeat.  You are unable to pass a normal amount of  urine.  You develop abnormal bleeding such as bleeding from the rectum or you vomit blood.  You have thoughts about harming yourself or committing suicide.  You are worried that you might harm someone else. MAKE SURE YOU:   Understand these instructions.  Will watch your condition.  Will get help right away if you are not doing well or get worse. Document Released: 04/27/2007 Document Revised: 09/22/2011 Document Reviewed: 11/01/2013 [facility] Patient Information 2015 [place], [place]. This information is not intended to replace advice given to you by your health care provider. Make sure you discuss any questions you have with your health care provider.

## 2015-01-22 ENCOUNTER — Encounter (HOSPITAL_BASED_OUTPATIENT_CLINIC_OR_DEPARTMENT_OTHER): Payer: Medicaid Other

## 2015-01-22 ENCOUNTER — Encounter (HOSPITAL_COMMUNITY): Payer: Medicaid Other | Attending: Oncology | Admitting: Oncology

## 2015-01-22 ENCOUNTER — Encounter (HOSPITAL_COMMUNITY): Payer: Self-pay | Admitting: Oncology

## 2015-01-22 VITALS — BP 157/95 | HR 57 | Temp 97.8°F | Resp 16 | Ht 70.0 in | Wt 211.2 lb

## 2015-01-22 DIAGNOSIS — G8929 Other chronic pain: Secondary | ICD-10-CM | POA: Diagnosis not present

## 2015-01-22 DIAGNOSIS — M549 Dorsalgia, unspecified: Secondary | ICD-10-CM | POA: Diagnosis not present

## 2015-01-22 DIAGNOSIS — M545 Low back pain: Secondary | ICD-10-CM

## 2015-01-22 DIAGNOSIS — Z87898 Personal history of other specified conditions: Secondary | ICD-10-CM | POA: Diagnosis not present

## 2015-01-22 DIAGNOSIS — Z720 Tobacco use: Secondary | ICD-10-CM | POA: Diagnosis not present

## 2015-01-22 DIAGNOSIS — G4700 Insomnia, unspecified: Secondary | ICD-10-CM | POA: Diagnosis not present

## 2015-01-22 DIAGNOSIS — F1021 Alcohol dependence, in remission: Secondary | ICD-10-CM | POA: Diagnosis not present

## 2015-01-22 DIAGNOSIS — D696 Thrombocytopenia, unspecified: Secondary | ICD-10-CM | POA: Diagnosis not present

## 2015-01-22 DIAGNOSIS — F1410 Cocaine abuse, uncomplicated: Secondary | ICD-10-CM | POA: Insufficient documentation

## 2015-01-22 DIAGNOSIS — Z79899 Other long term (current) drug therapy: Secondary | ICD-10-CM | POA: Diagnosis not present

## 2015-01-22 DIAGNOSIS — E039 Hypothyroidism, unspecified: Secondary | ICD-10-CM | POA: Diagnosis not present

## 2015-01-22 HISTORY — DX: Thrombocytopenia, unspecified: D69.6

## 2015-01-22 LAB — COMPREHENSIVE METABOLIC PANEL
ALT: 22 U/L (ref 17–63)
ANION GAP: 8 (ref 5–15)
AST: 29 U/L (ref 15–41)
Albumin: 3.9 g/dL (ref 3.5–5.0)
Alkaline Phosphatase: 52 U/L (ref 38–126)
BUN: 13 mg/dL (ref 6–20)
CALCIUM: 8.6 mg/dL — AB (ref 8.9–10.3)
CO2: 26 mmol/L (ref 22–32)
CREATININE: 0.95 mg/dL (ref 0.61–1.24)
Chloride: 101 mmol/L (ref 101–111)
GFR calc Af Amer: >60 mL/min (ref >60)
GFR calc non Af Amer: >60 mL/min (ref >60)
Glucose, Bld: 96 mg/dL (ref 65–99)
POTASSIUM: 3.8 mmol/L (ref 3.5–5.1)
SODIUM: 135 mmol/L (ref 135–145)
TOTAL PROTEIN: 7.4 g/dL (ref 6.5–8.1)
Total Bilirubin: 0.9 mg/dL (ref 0.3–1.2)

## 2015-01-22 LAB — CBC WITH DIFFERENTIAL/PLATELET
Basophils Absolute: 0.1 10*3/uL (ref 0.0–0.1)
Basophils Relative: 1 % (ref 0–1)
EOS ABS: 0.2 10*3/uL (ref 0.0–0.7)
Eosinophils Relative: 3 % (ref 0–5)
HCT: 45.9 % (ref 39.0–52.0)
HEMOGLOBIN: 15.7 g/dL (ref 13.0–17.0)
LYMPHS ABS: 3.7 10*3/uL (ref 0.7–4.0)
Lymphocytes Relative: 46 % (ref 12–46)
MCH: 33.5 pg (ref 26.0–34.0)
MCHC: 34.2 g/dL (ref 30.0–36.0)
MCV: 98.1 fL (ref 78.0–100.0)
Monocytes Absolute: 0.4 10*3/uL (ref 0.1–1.0)
Monocytes Relative: 5 % (ref 3–12)
NEUTROS ABS: 3.6 10*3/uL (ref 1.7–7.7)
Neutrophils Relative %: 45 % (ref 43–77)
Platelets: 126 10*3/uL — ABNORMAL LOW (ref 150–400)
RBC: 4.68 MIL/uL (ref 4.22–5.81)
RDW: 13.6 % (ref 11.5–15.5)
WBC: 8 10*3/uL (ref 4.0–10.5)

## 2015-01-22 LAB — RAPID URINE DRUG SCREEN, HOSP PERFORMED
AMPHETAMINES: NOT DETECTED
Barbiturates: NOT DETECTED
Benzodiazepines: POSITIVE — AB
Cocaine: NOT DETECTED
OPIATES: POSITIVE — AB
Tetrahydrocannabinol: NOT DETECTED

## 2015-01-22 LAB — TSH: TSH: 11.042 u[IU]/mL — ABNORMAL HIGH (ref 0.350–4.500)

## 2015-01-22 NOTE — Patient Instructions (Signed)
[facility] Discharge Instructions  RECOMMENDATIONS MADE BY THE CONSULTANT AND ANY TEST RESULTS WILL BE SENT TO YOUR REFERRING PHYSICIAN.  Exam and discussion by Dellis Aneshomas Kefalas, PA-C and Dr. Galen ManilaPenland. Will check some additional labs today and will get you scheduled for an ultrasound of your liver and spleen to see if we can determine the cause of your low platelets.  Will see you back in 4 Weeks to discuss all results.  Thank you for choosing  [facility] to provide your oncology and hematology care.  To afford each patient quality time with our provider, please arrive at least 15 minutes before your scheduled appointment time.    You need to re-schedule your appointment should you arrive 10 or more minutes late.  We strive to give you quality time with our providers, and arriving late affects you and other patients whose appointments are after yours.  Also, if you no show three or more times for appointments you may be dismissed from the clinic at the providers discretion.     Again, thank you for choosing [facility].  Our hope is that these requests will decrease the amount of time that you wait before being seen by our physicians.       _____________________________________________________________  Should you have questions after your visit to [facility], please contact our office at [phone] between the hours of 8:30 a.m. and 4:30 p.m.  Voicemails left after 4:30 p.m. will not be returned until the following business day.  For prescription refill requests, have your pharmacy contact our office.   Thrombocytopenia Thrombocytopenia means there are not enough platelets in your blood. Platelets are tiny cells in your blood. When you start bleeding, platelets clump together around the cut or injury to stop the bleeding. This process is called blood clotting. Not having enough platelets can cause  bleeding problems. HOME CARE  Check your skin and inside your mouth for bruises or blood as told by your doctor.  Check your spit (sputum), pee (urine), and poop (stool) for blood as told by your doctor.  Do not do activities that can cause bumps or bruises until your doctor says it is okay.  Be careful not to cut yourself when you shave or use scissors, needles, knives, or other tools.  Be careful not to burn yourself when you iron or cook.  Ask your doctor if you can drink alcohol.  Only take medicines as told by your doctor.  Tell all your doctors and your dentist that you have this bleeding problem. GET HELP RIGHT AWAY IF:  You are bleeding anywhere on your body.  You are bleeding or have bruises without knowing why.  You have blood in your spit, pee, or poop. MAKE SURE YOU:  Understand these instructions.  Will watch your condition.  Will get help right away if you are not doing well or get worse. Document Released: 06/19/2011 Document Revised: 09/22/2011 Document Reviewed: 06/19/2011 [facility] Patient Information 2015 [place], [place]. This information is not intended to replace advice given to you by your health care provider. Make sure you discuss any questions you have with your health care provider.

## 2015-01-22 NOTE — Progress Notes (Signed)
[facility] Hematology/Oncology Consultation   Name: Jeremy Weeks      MRN: [id number]    Location: Room/bed info not found  Date: 01/23/2015 Time:5:23 PM   REFERRING PHYSICIAN:  Collene Mares, PA-C  REASON FOR CONSULT:  Thrombocytopenia dating back to at least 2009 according to [facility].   DIAGNOSIS:  Thrombocytopenia, in the setting for normal hemoglobin and WBC.  HISTORY OF PRESENT ILLNESS:   Mr. Jeremy Weeks is a 54 year old white American man with a past medical history significant for history of drug overdose in 2012 (reported to be tricyclic overdose) requiring hospitalization and intubation for acute respiratory failure, history of cocaine abuse who was referred to [facility] for thrombocytopenia.  I personally reviewed and went over laboratory results with the patient.  The results are noted within this dictation.  I personally reviewed and went over radiographic studies with the patient.  The results are noted within this dictation.    Chart reviewed.  Of note, on 01/18/2015, the patient presented to the ED with "fatigue."  Labs performed in the ED demonstrate a CBC WNL.  Differential demonstrates a lymphocytosis.  I only have two data points from the patient's primary office.  One from March 2015 demonstrates an elevated TSH, elevated Hgb of 17.4 g/dL, and platelet count of 118.  Another from June 2016 demonstrates a normal Hgb and a platelet count of 76,000.  Additionally, the patient denies any illicit drug abuse since his 20's but on chart review, he tested positive for cocaine in his urine in 2012.  He reports that he came to the ED last week at [facility] because he was frightened of his low platelet count.  He reports that he was not informed of the reason for today's consult.     In the ED CBC was WNL, except for a mild lymphocytosis.  He denies any B symptoms.  He denies any blood in stool, black tarry stools, hematuria, hematemesis, hemoptysis, and easy bruising.   He does take ASA daily.  PAST MEDICAL HISTORY:   Past Medical History  Diagnosis Date  . Thyroid disease   . Back pain   . Thrombocytopenia 01/22/2015    ALLERGIES: Allergies  Allergen Reactions  . Codeine Rash      MEDICATIONS: I have reviewed the patient's current medications.    Current Outpatient Prescriptions on File Prior to Visit  Medication Sig Dispense Refill  . acetaminophen (TYLENOL) 500 MG tablet Take 1,000 mg by mouth every 6 (six) hours as needed for moderate pain.    Marland Kitchen ALPRAZolam (XANAX) 1 MG tablet Take 1 mg by mouth 2 (two) times daily.    Marland Kitchen aspirin EC 81 MG tablet Take 81 mg by mouth every morning.    Marland Kitchen ibuprofen (ADVIL,MOTRIN) 800 MG tablet Take 800 mg by mouth 3 (three) times daily as needed for pain.    Marland Kitchen levothyroxine (SYNTHROID, LEVOTHROID) 112 MCG tablet Take 112 mcg by mouth daily before breakfast.    . oxyCODONE-acetaminophen (PERCOCET) 10-325 MG per tablet Take 1 tablet by mouth 3 (three) times daily as needed for pain.    Marland Kitchen zolpidem (AMBIEN) 10 MG tablet Take 10 mg by mouth at bedtime as needed for sleep.    . cyclobenzaprine (FLEXERIL) 10 MG tablet Take 1 tablet (10 mg total) by mouth 2 (two) times daily as needed for muscle spasms. (Patient not taking: Reported on 01/18/2015) 20 tablet 0  . traMADol (ULTRAM) 50 MG tablet  Take 1 tablet (50 mg total) by mouth every 6 (six) hours as needed for pain. (Patient not taking: Reported on 01/18/2015) 15 tablet 0  . vitamin B-12 (CYANOCOBALAMIN) 1000 MCG tablet Take 1,000 mcg by mouth every morning.    . vitamin E (VITAMIN E) 400 UNIT capsule Take 400 Units by mouth every morning.     No current facility-administered medications on file prior to visit.     PAST SURGICAL HISTORY Past Surgical History  Procedure Laterality Date  . Cholecystectomy    . Back surgery      FAMILY HISTORY: Mother is deceased in her 10's secondary to hip surgery (odd history from patient).  Father deceased in his 58'N from  complications of Parkinson's Disease.  He has no children.  SOCIAL HISTORY:  reports that he has been smoking Cigarettes.  He has a 37 pack-year smoking history. He has never used smokeless tobacco. He reports that he does not drink alcohol or use illicit drugs.  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  He admits to smoking 1/2 ppd x 40 years, but he smoked up to 2 ppd.  Additionally, he admits to a history of illicit drug abuse in his 65's and 30's.  He is single.  He had a long time girlfriend but they broke up because he found the [place] and she did not, so they could not be together.  He is on disability from his back.  He used to work in [employer].  PERFORMANCE STATUS: The patient's performance status is 1 - Symptomatic but completely ambulatory  PHYSICAL EXAM: Most Recent Vital Signs: Blood pressure 157/95, pulse 57, temperature 97.8 F (36.6 C), temperature source Oral, resp. rate 16, height 5' 10" (1.778 m), weight 211 lb 3.2 oz (95.8 kg), SpO2 100 %. General appearance: alert, cooperative and no distress Head: Normocephalic, without obvious abnormality, atraumatic Eyes: negative findings: lids and lashes normal, conjunctivae and sclerae normal and corneas clear Neck: no adenopathy and supple, symmetrical, trachea midline Lungs: clear to auscultation bilaterally and normal percussion bilaterally Heart: regular rate and rhythm, S1, S2 normal, no murmur, click, rub or gallop Abdomen: normal findings: no masses palpable and soft, non-tender and exam hindered due to patient's inability to lay flat from back surgery. Extremities: extremities normal, atraumatic, no cyanosis or edema Skin: Skin color, texture, turgor normal. No rashes or lesions Lymph nodes: Cervical, supraclavicular, and axillary nodes normal. Neurologic: Grossly normal  LABORATORY DATA:  CBC    Component Value Date/Time   WBC 8.0  01/22/2015 1227   RBC 4.68 01/22/2015 1227   RBC 4.91 01/18/2015 1219   HGB 15.7 01/22/2015 1227   HCT 45.9 01/22/2015 1227   PLT 126* 01/22/2015 1227   MCV 98.1 01/22/2015 1227   MCH 33.5 01/22/2015 1227   MCHC 34.2 01/22/2015 1227   RDW 13.6 01/22/2015 1227   LYMPHSABS 3.7 01/22/2015 1227   MONOABS 0.4 01/22/2015 1227   EOSABS 0.2 01/22/2015 1227   BASOSABS 0.1 01/22/2015 1227      Chemistry      Component Value Date/Time   NA 135 01/22/2015 1227   K 3.8 01/22/2015 1227   CL 101 01/22/2015 1227   CO2 26 01/22/2015 1227   BUN 13 01/22/2015 1227   CREATININE 0.95 01/22/2015 1227      Component Value Date/Time   CALCIUM 8.6* 01/22/2015 1227   ALKPHOS 52 01/22/2015 1227  AST 29 01/22/2015 1227   ALT 22 01/22/2015 1227   BILITOT 0.9 01/22/2015 1227      PATHOLOGY:  None  ASSESSMENT:  1. Thrombocytopenia, dating back to at least 2009. 2. Chronic back pain 3. Hypothyroidism 4. Insomnia 5. History of alcohol use 6. History of illicit drug use  PLAN:  1. I personally reviewed and went over laboratory results with the patient.  The results are noted within this dictation. 2. I personally reviewed and went over radiographic studies with the patient.  The results are noted within this dictation.   3. Chart reviewed 4. Labs today: CBC diff, CMET, TSH, Hep Panel, HIV Antibody, peripheral smear for pathology review. 5. Urine drug screen today due to noted history of drug abuse.  Of note, he is on chronic Percocet for back pain. 6. [place] of liver to evaluate for cirrhosis of liver due to history of EtOHism 7. [place] of spleen to evaluate for splenomegaly. 8. Return in 4 weeks for follow-up.  All questions were answered. The patient knows to call the clinic with any problems, questions or concerns. We can certainly see the patient much sooner if necessary.  Patient and plan discussed with Dr. Ancil Linsey and she is in agreement with the aforementioned.   This note is  electronically signed by: Molli Hazard, MD 01/23/2015 5:23 PM   The patient is seen and examined and agreed with above. We have recommended checking laboratory studies including hepatitis panel and HIV given patient's past. In addition H CV and HIV are recommended testing as part of evaluation of thrombocytopenia. We will check a CBC today with peripheral smear and pathology review. I recommended an ultrasound of the liver to evaluate for the possibility of cirrhosis and resultant splenomegaly may be the cause of his underlying thrombocytopenia. Discussed with the patient that the causes of thrombocytopenia are broad. We briefly S the possibility of proceeding with a bone marrow biopsy pending the results of his evaluation today. The patient is open to proceeding with whatever testing as deemed necessary. I will see him back in 1-2 weeks to review the above results and make additional recommendations and treatment planning at that time. Molli Hazard, MD

## 2015-01-22 NOTE — Progress Notes (Signed)
Labs drawn

## 2015-01-23 ENCOUNTER — Other Ambulatory Visit (HOSPITAL_COMMUNITY): Payer: Self-pay | Admitting: Oncology

## 2015-01-23 DIAGNOSIS — D696 Thrombocytopenia, unspecified: Secondary | ICD-10-CM

## 2015-01-23 LAB — HEPATITIS PANEL, ACUTE
HCV Ab: >11 s/co ratio — ABNORMAL HIGH (ref 0.0–0.9)
HEP A IGM: NEGATIVE
Hep B C IgM: NEGATIVE
Hepatitis B Surface Ag: NEGATIVE

## 2015-01-23 LAB — HIV ANTIBODY (ROUTINE TESTING W REFLEX): HIV Screen 4th Generation wRfx: NONREACTIVE

## 2015-01-23 LAB — PATHOLOGIST SMEAR REVIEW

## 2015-01-29 ENCOUNTER — Encounter (HOSPITAL_COMMUNITY): Payer: Medicaid Other | Attending: Hematology & Oncology

## 2015-01-29 DIAGNOSIS — D696 Thrombocytopenia, unspecified: Secondary | ICD-10-CM

## 2015-01-29 NOTE — Progress Notes (Signed)
Labs drawn

## 2015-02-01 LAB — HCV RNA QUANT RFLX ULTRA OR GENOTYP
HCV RNA Qnt(log copy/mL): 6.72 log10 IU/mL
HepC Qn: 5246870 IU/mL

## 2015-02-01 LAB — HEPATITIS C GENOTYPE

## 2015-02-02 ENCOUNTER — Other Ambulatory Visit (HOSPITAL_COMMUNITY): Payer: Self-pay

## 2015-02-08 ENCOUNTER — Ambulatory Visit (HOSPITAL_COMMUNITY)
Admission: RE | Admit: 2015-02-08 | Discharge: 2015-02-08 | Disposition: A | Discharge status: Home / Self Care | Payer: Medicaid Other | Source: Ambulatory Visit | Attending: Oncology | Admitting: Oncology

## 2015-02-08 DIAGNOSIS — Z9049 Acquired absence of other specified parts of digestive tract: Secondary | ICD-10-CM | POA: Insufficient documentation

## 2015-02-08 DIAGNOSIS — D696 Thrombocytopenia, unspecified: Secondary | ICD-10-CM | POA: Diagnosis not present

## 2015-02-08 DIAGNOSIS — R932 Abnormal findings on diagnostic imaging of liver and biliary tract: Secondary | ICD-10-CM | POA: Diagnosis not present

## 2015-02-08 DIAGNOSIS — K838 Other specified diseases of biliary tract: Secondary | ICD-10-CM | POA: Diagnosis not present

## 2015-02-08 DIAGNOSIS — F1010 Alcohol abuse, uncomplicated: Secondary | ICD-10-CM | POA: Diagnosis not present

## 2015-02-19 ENCOUNTER — Encounter (HOSPITAL_COMMUNITY): Payer: Medicaid Other | Attending: Oncology | Admitting: Hematology & Oncology

## 2015-02-19 ENCOUNTER — Encounter (HOSPITAL_COMMUNITY): Payer: Self-pay | Admitting: Hematology & Oncology

## 2015-02-19 VITALS — BP 133/82 | HR 76 | Temp 97.8°F | Resp 18 | Wt 208.9 lb

## 2015-02-19 DIAGNOSIS — Z79899 Other long term (current) drug therapy: Secondary | ICD-10-CM | POA: Insufficient documentation

## 2015-02-19 DIAGNOSIS — E039 Hypothyroidism, unspecified: Secondary | ICD-10-CM | POA: Insufficient documentation

## 2015-02-19 DIAGNOSIS — F1410 Cocaine abuse, uncomplicated: Secondary | ICD-10-CM | POA: Insufficient documentation

## 2015-02-19 DIAGNOSIS — B1920 Unspecified viral hepatitis C without hepatic coma: Secondary | ICD-10-CM | POA: Diagnosis not present

## 2015-02-19 DIAGNOSIS — D696 Thrombocytopenia, unspecified: Secondary | ICD-10-CM | POA: Insufficient documentation

## 2015-02-19 DIAGNOSIS — R161 Splenomegaly, not elsewhere classified: Secondary | ICD-10-CM | POA: Diagnosis not present

## 2015-02-19 DIAGNOSIS — G8929 Other chronic pain: Secondary | ICD-10-CM | POA: Insufficient documentation

## 2015-02-19 DIAGNOSIS — M549 Dorsalgia, unspecified: Secondary | ICD-10-CM | POA: Insufficient documentation

## 2015-02-19 DIAGNOSIS — G4700 Insomnia, unspecified: Secondary | ICD-10-CM | POA: Insufficient documentation

## 2015-02-19 NOTE — Patient Instructions (Addendum)
[facility] at [facility] Discharge Instructions  RECOMMENDATIONS MADE BY THE CONSULTANT AND ANY TEST RESULTS WILL BE SENT TO YOUR REFERRING PHYSICIAN.  Exam completed by Dr Galen Manila today Referral made to Dr Karilyn Cota for newly diagnosed Hepatitis C Return in 2 months to see the doctor Please call the clinic if you have any questions or concerns   Thank you for choosing [facility] at [facility] to provide your oncology and hematology care.  To afford each patient quality time with our provider, please arrive at least 15 minutes before your scheduled appointment time.    You need to re-schedule your appointment should you arrive 10 or more minutes late.  We strive to give you quality time with our providers, and arriving late affects you and other patients whose appointments are after yours.  Also, if you no show three or more times for appointments you may be dismissed from the clinic at the providers discretion.     Again, thank you for choosing [facility].  Our hope is that these requests will decrease the amount of time that you wait before being seen by our physicians.       _____________________________________________________________  Should you have questions after your visit to [facility], please contact our office at [phone] between the hours of 8:30 a.m. and 4:30 p.m.  Voicemails left after 4:30 p.m. will not be returned until the following business day.  For prescription refill requests, have your pharmacy contact our office.

## 2015-02-19 NOTE — Progress Notes (Signed)
[facility] Hematology/Oncology Progress  Name: Jeremy Weeks      MRN: [id number]    Location: Room/bed info not found  Date: 02/19/2015 Time:2:54 PM   REFERRING PHYSICIAN:  Lenise Herald, PA-C  REASON FOR CONSULT:  Thrombocytopenia dating back to at least 2009 according to [facility].   DIAGNOSIS:   Thrombocytopenia, in the setting for normal hemoglobin and WBC. Hepatitis C Cirrhosis by ultrasound imaging Borderline splenomegaly  HISTORY OF PRESENT ILLNESS:   Jeremy Weeks is a 54 year old white American man with a past medical history significant for history of drug overdose in 2012 (reported to be tricyclic overdose) requiring hospitalization and intubation for acute respiratory failure, history of cocaine abuse who was referred to [facility] for thrombocytopenia. .   Chart reviewed.  Of note, on 01/18/2015, the patient presented to the ED with "fatigue."  Labs performed in the ED demonstrate a CBC WNL.  Differential demonstrates a lymphocytosis.  I only have two data points from the patient's primary office.  One from March 2015 demonstrates an elevated TSH, elevated Hgb of 17.4 g/dL, and platelet count of 118.  Another from June 2016 demonstrates a normal Hgb and a platelet count of 76,000.  Additionally, the patient denies any illicit drug abuse since his 20's but on chart review, he tested positive for cocaine in his urine in 2012.  He denies any B symptoms.  He denies any blood in stool, black tarry stools, hematuria, hematemesis,   He is here alone today. We discussed the results of his blood tests, namely his Hepatitis C diagnosis. He does not have a GI specialist currently. He is agreeable to being referred to GI. He is concerned about his Medicare coverage and whether he'll be able to afford a copay going to GI. His A and B Medicare starts the first of next month. He had a blood transfusion when he was a baby due to jaundice. He has old tattoos. He denies any IV drug  use. He notes having regular blood checks due to his thyroid problems.  PAST MEDICAL HISTORY:   Past Medical History  Diagnosis Date  . Thyroid disease   . Back pain   . Thrombocytopenia 01/22/2015    ALLERGIES: Allergies  Allergen Reactions  . Codeine Rash      MEDICATIONS: I have reviewed the patient's current medications.    Current Outpatient Prescriptions on File Prior to Visit  Medication Sig Dispense Refill  . ALPRAZolam (XANAX) 1 MG tablet Take 1 mg by mouth 2 (two) times daily.    Marland Kitchen aspirin EC 81 MG tablet Take 81 mg by mouth every morning.    Marland Kitchen ibuprofen (ADVIL,MOTRIN) 800 MG tablet Take 800 mg by mouth 3 (three) times daily as needed for pain.    Marland Kitchen levothyroxine (SYNTHROID, LEVOTHROID) 112 MCG tablet Take 112 mcg by mouth daily before breakfast.    . oxyCODONE-acetaminophen (PERCOCET) 10-325 MG per tablet Take 1 tablet by mouth 3 (three) times daily as needed for pain.    . vitamin B-12 (CYANOCOBALAMIN) 1000 MCG tablet Take 1,000 mcg by mouth every morning.    . vitamin E (VITAMIN E) 400 UNIT capsule Take 400 Units by mouth every morning.    . zolpidem (AMBIEN) 10 MG tablet Take 10 mg by mouth at bedtime as needed for sleep.     No current facility-administered medications on file prior to visit.     PAST SURGICAL HISTORY Past Surgical History  Procedure Laterality Date  . Cholecystectomy    . Back surgery      FAMILY HISTORY: Mother is deceased in her 39's secondary to hip surgery (odd history from patient).  Father deceased in his 52's from complications of Parkinson's Disease.  He has no children.  SOCIAL HISTORY:  reports that he has been smoking Cigarettes.  He has a 37 pack-year smoking history. He has never used smokeless tobacco. He reports that he does not drink alcohol or use illicit drugs.  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  He admits to smoking 1/2 ppd x 40 years,  but he smoked up to 2 ppd.  Additionally, he admits to a history of illicit drug abuse in his 44's and 30's.  He is single.  He had a long time girlfriend but they broke up because he found the [place] and she did not, so they could not be together.  He is on disability from his back.  He used to work in [employer].  PERFORMANCE STATUS: The patient's performance status is 1 - Symptomatic but completely ambulatory  PHYSICAL EXAM: Most Recent Vital Signs: Blood pressure 133/82, pulse 76, temperature 97.8 F (36.6 C), temperature source Oral, resp. rate 18, weight 208 lb 14.4 oz (94.756 kg), SpO2 99 %. General appearance: alert, cooperative and no distress Head: Normocephalic, without obvious abnormality, atraumatic Eyes: negative findings: lids and lashes normal, conjunctivae and sclerae normal and corneas clear Neck: no adenopathy and supple, symmetrical, trachea midline Lungs: clear to auscultation bilaterally and normal percussion bilaterally Heart: regular rate and rhythm, S1, S2 normal, no murmur, click, rub or gallop Abdomen: normal findings: no masses palpable and soft, non-tender and exam hindered due to patient's inability to lay flat from back surgery. Extremities: extremities normal, atraumatic, no cyanosis or edema Skin: Skin color, texture, turgor normal. No rashes or lesions Lymph nodes: Cervical, supraclavicular, and axillary nodes normal. Neurologic: Grossly normal  LABORATORY DATA:  CBC    Component Value Date/Time   WBC 8.0 01/22/2015 1227   RBC 4.68 01/22/2015 1227   RBC 4.91 01/18/2015 1219   HGB 15.7 01/22/2015 1227   HCT 45.9 01/22/2015 1227   PLT 126* 01/22/2015 1227   MCV 98.1 01/22/2015 1227   MCH 33.5 01/22/2015 1227   MCHC 34.2 01/22/2015 1227   RDW 13.6 01/22/2015 1227   LYMPHSABS 3.7 01/22/2015 1227   MONOABS 0.4 01/22/2015 1227   EOSABS 0.2 01/22/2015 1227   BASOSABS 0.1 01/22/2015 1227      Chemistry      Component Value Date/Time   NA 135  01/22/2015 1227   K 3.8 01/22/2015 1227   CL 101 01/22/2015 1227   CO2 26 01/22/2015 1227   BUN 13 01/22/2015 1227   CREATININE 0.95 01/22/2015 1227      Component Value Date/Time   CALCIUM 8.6* 01/22/2015 1227   ALKPHOS 52 01/22/2015 1227   AST 29 01/22/2015 1227   ALT 22 01/22/2015 1227   BILITOT 0.9 01/22/2015 1227      RADIOLOGY:   CLINICAL DATA: Alcohol abuse. Thrombocytopenia.  EXAM: ULTRASOUND ABDOMEN COMPLETE  COMPARISON: None.  FINDINGS: Gallbladder: Surgically absent.  Common bile duct: Diameter: 12 mm, which is mildly dilated status post cholecystectomy.  Liver: No focal liver mass visualized. Hepatic parenchyma shows coarsened echotexture and mild capsular nodularity, highly suspicious for hepatic cirrhosis . No evidence of ascites.  IVC: No  abnormality visualized.  Pancreas: Visualized portion unremarkable.  Spleen: Borderline splenomegaly demonstrated, with length of approximately 10.7 cm and estimated volume of 452 mL. No splenic masses identified.  Right Kidney: Length: 10.4 cm. Echogenicity within normal limits. No mass or hydronephrosis visualized.  Left Kidney: Length: 11.9 cm. Echogenicity within normal limits. 3.4 cm simple appearing cyst noted in upper pole. No mass or hydronephrosis visualized.  Abdominal aorta: No aneurysm visualized. Atherosclerotic plaque noted.  Other findings: None.  IMPRESSION: Prior cholecystectomy. Mild biliary ductal dilatation measuring 12 mm. No etiology visualized sonographically. Consider MRCP for further evaluation if clinically warranted.  Sonographic findings highly suspicious for hepatic cirrhosis. No focal hepatic mass visualized sonographically. If clinically warranted, consider hepatic elastography ultrasound for non-invasive risk assessment for hepatic fibrosis/cirrhosis.  Borderline splenomegaly. No evidence of ascites.   Electronically Signed  By: Myles Rosenthal  M.D.  On: 02/08/2015 11:25        ASSESSMENT:  1. Thrombocytopenia, dating back to at least 2009. 2. Chronic back pain 3. Hypothyroidism 4. Insomnia 5. History of alcohol use 6. History of illicit drug use 7. Hepatitis C positive 8. Cirrhosis seen on Ultrasound imaging of abdomen, borderline splenomegaly  PLAN:   I discussed with the patient that I suspect he has mild thrombocytopenia from his borderline splenomegaly but also evidence of cirrhosis. I discussed his hepatitis C diagnosis and advised him that this will need additional workup and recommendations regarding treatment. I educated him on hepatitis C to the best of my ability but advised him that he needs to meet with a gastroenterologist.  I advised the patient of the importance of treating his hepatitis C. I discussed the ongoing problems he will have with progressive liver disease if he does not treat his hepatitis.   I will refer Mr. Vowels to Dr. Lora Havens for the continued treatment of his recently diagnosed Hepatitis C.  We will see Mr. Parthasarathy back in October for a routine follow up and blood check.  All questions were answered. The patient knows to call the clinic with any problems, questions or concerns. We can certainly see the patient much sooner if necessary.  This document serves as a record of services personally performed by Loma Messing, MD. It was created on her behalf by Delana Meyer, a trained medical scribe. The creation of this record is based on the scribe's personal observations and the provider's statements to them. This document has been checked and approved by the attending provider.  I have reviewed the above documentation for accuracy and completeness, and I agree with the above.   Arvil Chaco, MD 02/19/2015 2:54 PM

## 2015-02-22 ENCOUNTER — Encounter (INDEPENDENT_AMBULATORY_CARE_PROVIDER_SITE_OTHER): Payer: Self-pay | Admitting: *Deleted

## 2015-03-21 DIAGNOSIS — F419 Anxiety disorder, unspecified: Secondary | ICD-10-CM | POA: Diagnosis not present

## 2015-03-21 DIAGNOSIS — I10 Essential (primary) hypertension: Secondary | ICD-10-CM | POA: Diagnosis not present

## 2015-03-21 DIAGNOSIS — G4700 Insomnia, unspecified: Secondary | ICD-10-CM | POA: Diagnosis not present

## 2015-03-21 DIAGNOSIS — Z1389 Encounter for screening for other disorder: Secondary | ICD-10-CM | POA: Diagnosis not present

## 2015-03-21 DIAGNOSIS — E663 Overweight: Secondary | ICD-10-CM | POA: Diagnosis not present

## 2015-03-21 DIAGNOSIS — Z23 Encounter for immunization: Secondary | ICD-10-CM | POA: Diagnosis not present

## 2015-03-21 DIAGNOSIS — G894 Chronic pain syndrome: Secondary | ICD-10-CM | POA: Diagnosis not present

## 2015-03-30 DIAGNOSIS — M545 Low back pain: Secondary | ICD-10-CM | POA: Diagnosis not present

## 2015-03-30 DIAGNOSIS — Z79899 Other long term (current) drug therapy: Secondary | ICD-10-CM | POA: Diagnosis not present

## 2015-03-30 DIAGNOSIS — F17200 Nicotine dependence, unspecified, uncomplicated: Secondary | ICD-10-CM | POA: Diagnosis not present

## 2015-03-30 DIAGNOSIS — Z7982 Long term (current) use of aspirin: Secondary | ICD-10-CM | POA: Diagnosis not present

## 2015-04-05 ENCOUNTER — Encounter (INDEPENDENT_AMBULATORY_CARE_PROVIDER_SITE_OTHER): Payer: Self-pay | Admitting: Internal Medicine

## 2015-04-05 ENCOUNTER — Encounter (INDEPENDENT_AMBULATORY_CARE_PROVIDER_SITE_OTHER): Payer: Self-pay | Admitting: *Deleted

## 2015-04-05 ENCOUNTER — Ambulatory Visit (INDEPENDENT_AMBULATORY_CARE_PROVIDER_SITE_OTHER): Payer: Medicare Other | Admitting: Internal Medicine

## 2015-04-05 VITALS — BP 104/74 | HR 64 | Temp 98.7°F | Ht 72.0 in | Wt 209.7 lb

## 2015-04-05 DIAGNOSIS — B1920 Unspecified viral hepatitis C without hepatic coma: Secondary | ICD-10-CM | POA: Diagnosis not present

## 2015-04-05 DIAGNOSIS — B1921 Unspecified viral hepatitis C with hepatic coma: Secondary | ICD-10-CM

## 2015-04-05 LAB — PROTIME-INR
INR: 1.04 (ref <1.50)
PROTHROMBIN TIME: 13.7 s (ref 11.6–15.2)

## 2015-04-05 NOTE — Progress Notes (Signed)
Subjective:    Patient ID: Jeremy Weeks, male    DOB: 05-19-1961, 54 y.o.   MRN: [id number]  HPI Referred to our office by Dr. Galen Manila for Hep C tx. Sees Dr. Galen Manila for thrombocytopenia.  Appetite is good. No weight loss.  No abdominal pain. Patient has chronic back pain with hx of back surgery 1 yr ago in [place].  No melena or BRRB BM x 1 a day.  He is Genotype 1B Have you ever been treated for Hepatitis C? no Any hx of IV drug abuse or drug abuse? No IV drug abuse Are you drinking now? no Any hx of etoh abuse?  None in 26 yrs.  Do you have tattoos? 4 tattos not professional done Have you ever received a blood transfusion? 3 weeks after being born. When were you diagnosed with Hepatitis C? July 2016 Any hx of mental illness requiring treatment no Do you have suicidal thoughts? no  01/22/2015 Hep C antibody positive. Hep C quaint 6.720 Hep B Surface Antigen negative  Hep B IgM negative Hep A IgM negative.  02/08/2015 US abdomen:  IMPRESSION: Prior cholecystectomy. Mild biliary ductal dilatation measuring 12 mm. No etiology visualized sonographically. Consider MRCP for further evaluation if clinically warranted.  Sonographic findings highly suspicious for hepatic cirrhosis. No focal hepatic mass visualized sonographically. If clinically warranted, consider hepatic elastography ultrasound for non-invasive risk assessment for hepatic fibrosis/cirrhosis.  Borderline splenomegaly. No evidence of ascites.    CBC    Component Value Date/Time   WBC 8.0 01/22/2015 1227   RBC 4.68 01/22/2015 1227   RBC 4.91 01/18/2015 1219   HGB 15.7 01/22/2015 1227   HCT 45.9 01/22/2015 1227   PLT 126* 01/22/2015 1227   MCV 98.1 01/22/2015 1227   MCH 33.5 01/22/2015 1227   MCHC 34.2 01/22/2015 1227   RDW 13.6 01/22/2015 1227   LYMPHSABS 3.7 01/22/2015 1227   MONOABS 0.4 01/22/2015 1227   EOSABS 0.2 01/22/2015 1227   BASOSABS 0.1 01/22/2015 1227    Hepatic Function Panel       Component Value Date/Time   PROT 7.4 01/22/2015 1227   ALBUMIN 3.9 01/22/2015 1227   AST 29 01/22/2015 1227   ALT 22 01/22/2015 1227   ALKPHOS 52 01/22/2015 1227   BILITOT 0.9 01/22/2015 1227   BILIDIR 0.5* 09/28/2007 0415   IBILI 0.7 09/28/2007 0415     Review of Systems     Past Medical History  Diagnosis Date  . Thyroid disease   . Back pain   . Thrombocytopenia 01/22/2015  . Hepatitis C     Past Surgical History  Procedure Laterality Date  . Cholecystectomy    . Back surgery      Allergies  Allergen Reactions  . Codeine Rash    Current Outpatient Prescriptions on File Prior to Visit  Medication Sig Dispense Refill  . ALPRAZolam (XANAX) 1 MG tablet Take 1 mg by mouth 2 (two) times daily.    Marland Kitchen amLODipine (NORVASC) 5 MG tablet Take 5 mg by mouth daily.  11  . aspirin EC 81 MG tablet Take 81 mg by mouth every morning.    Marland Kitchen levothyroxine (SYNTHROID, LEVOTHROID) 112 MCG tablet Take 112 mcg by mouth daily before breakfast.    . oxyCODONE-acetaminophen (PERCOCET) 10-325 MG per tablet Take 1 tablet by mouth 3 (three) times daily as needed for pain.    . vitamin B-12 (CYANOCOBALAMIN) 1000 MCG tablet Take 1,000 mcg by mouth every morning.    . vitamin  E (VITAMIN E) 400 UNIT capsule Take 400 Units by mouth every morning.     No current facility-administered medications on file prior to visit.     Objective:   Physical Exam Blood pressure 104/74, pulse 64, temperature 98.7 F (37.1 C), height 6' (1.829 m), weight 209 lb 11.2 oz (95.119 kg). Alert and oriented. Skin warm and dry. Oral mucosa is moist.   . Sclera anicteric, conjunctivae is pink. Thyroid not enlarged. No cervical lymphadenopathy. Lungs clear. Heart regular rate and rhythm.  Abdomen is soft. Bowel sounds are positive. No hepatomegaly. No abdominal masses felt. No tenderness.  No edema to lower extremities.          Assessment & Plan:  Hepatitis C.   PT/INR, TSH, CBC, Hepatic Function, [place]  elastrography Will treat with Viekira x 12 weeks or Harvoni x 12 weeks.

## 2015-04-05 NOTE — Patient Instructions (Signed)
OV in 3 months. 

## 2015-04-06 LAB — CBC WITH DIFFERENTIAL/PLATELET
BASOS PCT: 1 % (ref 0–1)
Basophils Absolute: 0.1 10*3/uL (ref 0.0–0.1)
EOS PCT: 3 % (ref 0–5)
Eosinophils Absolute: 0.2 10*3/uL (ref 0.0–0.7)
HEMATOCRIT: 44.3 % (ref 39.0–52.0)
HEMOGLOBIN: 14.8 g/dL (ref 13.0–17.0)
Lymphocytes Relative: 44 % (ref 12–46)
Lymphs Abs: 2.8 10*3/uL (ref 0.7–4.0)
MCH: 32.7 pg (ref 26.0–34.0)
MCHC: 33.4 g/dL (ref 30.0–36.0)
MCV: 97.8 fL (ref 78.0–100.0)
MONOS PCT: 7 % (ref 3–12)
MPV: 12.3 fL (ref 8.6–12.4)
Monocytes Absolute: 0.4 10*3/uL (ref 0.1–1.0)
Neutro Abs: 2.8 10*3/uL (ref 1.7–7.7)
Neutrophils Relative %: 45 % (ref 43–77)
Platelets: 75 10*3/uL — ABNORMAL LOW (ref 150–400)
RBC: 4.53 MIL/uL (ref 4.22–5.81)
RDW: 14 % (ref 11.5–15.5)
WBC: 6.3 10*3/uL (ref 4.0–10.5)

## 2015-04-06 LAB — HEPATIC FUNCTION PANEL
ALBUMIN: 4.1 g/dL (ref 3.6–5.1)
ALT: 24 U/L (ref 9–46)
AST: 45 U/L — AB (ref 10–35)
Alkaline Phosphatase: 67 U/L (ref 40–115)
BILIRUBIN DIRECT: 0.4 mg/dL — AB (ref <=0.2)
BILIRUBIN TOTAL: 0.9 mg/dL (ref 0.2–1.2)
Indirect Bilirubin: 0.5 mg/dL (ref 0.2–1.2)
Total Protein: 7 g/dL (ref 6.1–8.1)

## 2015-04-06 LAB — TSH: TSH: 8.89 u[IU]/mL — AB (ref 0.350–4.500)

## 2015-04-19 DIAGNOSIS — E063 Autoimmune thyroiditis: Secondary | ICD-10-CM | POA: Diagnosis not present

## 2015-04-19 DIAGNOSIS — Z23 Encounter for immunization: Secondary | ICD-10-CM | POA: Diagnosis not present

## 2015-04-19 DIAGNOSIS — I10 Essential (primary) hypertension: Secondary | ICD-10-CM | POA: Diagnosis not present

## 2015-04-19 DIAGNOSIS — Z6829 Body mass index (BMI) 29.0-29.9, adult: Secondary | ICD-10-CM | POA: Diagnosis not present

## 2015-04-19 DIAGNOSIS — E782 Mixed hyperlipidemia: Secondary | ICD-10-CM | POA: Diagnosis not present

## 2015-04-19 DIAGNOSIS — Z1389 Encounter for screening for other disorder: Secondary | ICD-10-CM | POA: Diagnosis not present

## 2015-04-19 DIAGNOSIS — E663 Overweight: Secondary | ICD-10-CM | POA: Diagnosis not present

## 2015-04-20 ENCOUNTER — Other Ambulatory Visit (HOSPITAL_COMMUNITY): Payer: Medicaid Other

## 2015-04-20 ENCOUNTER — Ambulatory Visit (HOSPITAL_COMMUNITY): Payer: Medicaid Other | Admitting: Oncology

## 2015-04-20 NOTE — Progress Notes (Signed)
-  No show-  KEFALAS,THOMAS 04/20/2015 11:50 AM

## 2015-04-20 NOTE — Assessment & Plan Note (Deleted)
Mild thrombocytopenia secondary to mild splenomegaly and evidence of cirrhosis of liver secondary to Hepatitis C positivity.  He has been seen

## 2015-04-23 ENCOUNTER — Encounter (HOSPITAL_COMMUNITY): Payer: Self-pay

## 2015-04-25 ENCOUNTER — Ambulatory Visit (HOSPITAL_COMMUNITY): Admission: RE | Admit: 2015-04-25 | Payer: Medicare Other | Source: Ambulatory Visit

## 2015-05-02 ENCOUNTER — Encounter (HOSPITAL_COMMUNITY): Payer: Self-pay | Admitting: Oncology

## 2015-05-02 ENCOUNTER — Encounter (HOSPITAL_COMMUNITY): Payer: Medicaid Other | Attending: Oncology | Admitting: Oncology

## 2015-05-02 VITALS — BP 148/94 | HR 61 | Temp 97.7°F | Resp 14 | Wt 209.2 lb

## 2015-05-02 DIAGNOSIS — G4700 Insomnia, unspecified: Secondary | ICD-10-CM | POA: Insufficient documentation

## 2015-05-02 DIAGNOSIS — F1410 Cocaine abuse, uncomplicated: Secondary | ICD-10-CM | POA: Insufficient documentation

## 2015-05-02 DIAGNOSIS — G8929 Other chronic pain: Secondary | ICD-10-CM | POA: Diagnosis not present

## 2015-05-02 DIAGNOSIS — E039 Hypothyroidism, unspecified: Secondary | ICD-10-CM | POA: Insufficient documentation

## 2015-05-02 DIAGNOSIS — B1920 Unspecified viral hepatitis C without hepatic coma: Secondary | ICD-10-CM | POA: Diagnosis not present

## 2015-05-02 DIAGNOSIS — Z0000 Encounter for general adult medical examination without abnormal findings: Secondary | ICD-10-CM

## 2015-05-02 DIAGNOSIS — M549 Dorsalgia, unspecified: Secondary | ICD-10-CM | POA: Insufficient documentation

## 2015-05-02 DIAGNOSIS — D696 Thrombocytopenia, unspecified: Secondary | ICD-10-CM

## 2015-05-02 DIAGNOSIS — Z23 Encounter for immunization: Secondary | ICD-10-CM | POA: Diagnosis not present

## 2015-05-02 DIAGNOSIS — Z79899 Other long term (current) drug therapy: Secondary | ICD-10-CM | POA: Diagnosis not present

## 2015-05-02 LAB — COMPREHENSIVE METABOLIC PANEL
ALK PHOS: 54 U/L (ref 38–126)
ALT: 24 U/L (ref 17–63)
AST: 37 U/L (ref 15–41)
Albumin: 3.8 g/dL (ref 3.5–5.0)
Anion gap: 9 (ref 5–15)
BUN: 12 mg/dL (ref 6–20)
CHLORIDE: 101 mmol/L (ref 101–111)
CO2: 27 mmol/L (ref 22–32)
CREATININE: 0.95 mg/dL (ref 0.61–1.24)
Calcium: 9.3 mg/dL (ref 8.9–10.3)
GFR calc Af Amer: >60 mL/min (ref >60)
GFR calc non Af Amer: >60 mL/min (ref >60)
Glucose, Bld: 116 mg/dL — ABNORMAL HIGH (ref 65–99)
Potassium: 4.1 mmol/L (ref 3.5–5.1)
SODIUM: 137 mmol/L (ref 135–145)
Total Bilirubin: 1.2 mg/dL (ref 0.3–1.2)
Total Protein: 7.3 g/dL (ref 6.5–8.1)

## 2015-05-02 LAB — CBC WITH DIFFERENTIAL/PLATELET
Basophils Absolute: 0 10*3/uL (ref 0.0–0.1)
Basophils Relative: 1 %
EOS ABS: 0.2 10*3/uL (ref 0.0–0.7)
Eosinophils Relative: 4 %
HCT: 44.4 % (ref 39.0–52.0)
HEMOGLOBIN: 15.2 g/dL (ref 13.0–17.0)
LYMPHS ABS: 2.6 10*3/uL (ref 0.7–4.0)
Lymphocytes Relative: 47 %
MCH: 33.5 pg (ref 26.0–34.0)
MCHC: 34.2 g/dL (ref 30.0–36.0)
MCV: 97.8 fL (ref 78.0–100.0)
Monocytes Absolute: 0.4 10*3/uL (ref 0.1–1.0)
Monocytes Relative: 7 %
NEUTROS PCT: 41 %
Neutro Abs: 2.2 10*3/uL (ref 1.7–7.7)
Platelets: 56 10*3/uL — ABNORMAL LOW (ref 150–400)
RBC: 4.54 MIL/uL (ref 4.22–5.81)
RDW: 12.7 % (ref 11.5–15.5)
WBC: 5.4 10*3/uL (ref 4.0–10.5)

## 2015-05-02 MED ORDER — INFLUENZA VAC SPLIT QUAD 0.5 ML IM SUSY
0.5000 mL | PREFILLED_SYRINGE | Freq: Once | INTRAMUSCULAR | Status: AC
  Administered 2015-05-02: 0.5 mL via INTRAMUSCULAR
  Filled 2015-05-02: qty 0.5

## 2015-05-02 NOTE — Assessment & Plan Note (Addendum)
Thrombocytopenia in the setting of Hep C.  Continue follow-up with GI as directed.  Initiation of Hep C treatment has been on hold due to abnormal TSH.  He reports noncomplaince with his levothyroxine around the time his TSH was checked by GI.  Since then, he has been more compliant.  On 10/6, he had his TSH checked by Dr. Sherwood GamblerFusco revealing a TSH WNL.  I will message Dorene Arerri Setzer, NP to let her know as she may want to see him sooner than 12/22 for follow-up and consideration of initiating treatment sooner rather than later.  His biggest mortality, comparing his hypothyroidism to Hep C infection is certainly his Hep C.  There should not be an absolute contraindication to Hep C treatment in the setting of hypothyroidism.  I will defer treatment decision making to GI.  Continue follow-up with PCP as directed.  Labs today: CBC diff, CMET  Labs in 3 months: CBC diff, CMET  Return in 3 months for follow-up.

## 2015-05-02 NOTE — Progress Notes (Signed)
Jeremy Ribas, MD [address] [place] [address]  Preventative health care - Plan: Influenza vac split quadrivalent PF (FLUARIX) injection 0.5 mL  Thrombocytopenia (HCC) - Plan: CBC with Differential, Comprehensive metabolic panel  CURRENT THERAPY: Observation, needing treatment for active Hep C.  INTERVAL HISTORY: Jeremy Weeks 54 y.o. male returns for followup of long-standing thrombocytopenia, recently found to be Hep C positive during hematologic work-up for thrombocytopenia.  I personally reviewed and went over laboratory results with the patient.  The results are noted within this dictation.  Chart is reviewed.  He was seen by Dorene Ar, NP (GI) for consideration for hepatitis C treatment.  Labs were performed and TSH was not well controlled.  Treatment was deferred until TSH was addressed by primary care provider.  She has broached the subject of Harvoni versus Viekira treatment for Hep C.  He notes that he was not compliant with his Synthroid around the time of his TSH check by Terri.  Since then, he has been more compliant.  His TSH was re-checked by Dr. Sherwood Gambler on 04/19/2015 and his TSH was documented as being WNL according to their lab parameters.  Hyun is here for follow-up.  From a hematologic standpoint, there is nothing that needs to be done.  He needs Hep C treatment.  He denies any bleeding.   Past Medical History  Diagnosis Date  . Thyroid disease   . Back pain   . Thrombocytopenia (HCC) 01/22/2015  . Hepatitis C     has Thrombocytopenia (HCC) and Hepatitis C on his problem list.     is allergic to codeine.  Current Outpatient Prescriptions on File Prior to Visit  Medication Sig Dispense Refill  . ALPRAZolam (XANAX) 1 MG tablet Take 1 mg by mouth 2 (two) times daily.    Marland Kitchen aspirin EC 81 MG tablet Take 81 mg by mouth every morning.    Marland Kitchen levothyroxine (SYNTHROID, LEVOTHROID) 112 MCG tablet Take 112 mcg by mouth daily before  breakfast.    . oxyCODONE-acetaminophen (PERCOCET) 10-325 MG per tablet Take 1 tablet by mouth 3 (three) times daily as needed for pain.    . traZODone (DESYREL) 50 MG tablet Take 50 mg by mouth at bedtime.    Marland Kitchen amLODipine (NORVASC) 5 MG tablet Take 5 mg by mouth daily.  11  . vitamin E (VITAMIN E) 400 UNIT capsule Take 400 Units by mouth every morning.     No current facility-administered medications on file prior to visit.    Past Surgical History  Procedure Laterality Date  . Cholecystectomy    . Back surgery      Denies any headaches, dizziness, double vision, fevers, chills, night sweats, nausea, vomiting, diarrhea, constipation, chest pain, heart palpitations, shortness of breath, blood in stool, black tarry stool, urinary pain, urinary burning, urinary frequency, hematuria.   PHYSICAL EXAMINATION  ECOG PERFORMANCE STATUS: 1 - Symptomatic but completely ambulatory  Filed Vitals:   05/02/15 1455  BP: 148/94  Pulse: 61  Temp: 97.7 F (36.5 C)  Resp: 14    GENERAL:alert, no distress, well nourished, well developed, comfortable, cooperative, obese, smiling and unaccompanied SKIN: skin color, texture, turgor are normal, no rashes or significant lesions HEAD: Normocephalic, No masses, lesions, tenderness or abnormalities EYES: normal, PERRLA, EOMI, Conjunctiva are pink and non-injected EARS: External ears normal OROPHARYNX:lips, buccal mucosa, and tongue normal and mucous membranes are moist  NECK: supple, no adenopathy, trachea midline LYMPH:  no palpable lymphadenopathy  BREAST:not examined LUNGS: positive findings: wheezing  bilaterally HEART: regular rate & rhythm, no murmurs and no gallops ABDOMEN:abdomen soft, non-tender, obese and normal bowel sounds BACK: Back symmetric, no curvature., No CVA tenderness EXTREMITIES:less then 2 second capillary refill, no joint deformities, effusion, or inflammation, no edema, no skin discoloration, no cyanosis  NEURO: alert &  oriented x 3 with fluent speech, no focal motor/sensory deficits   LABORATORY DATA: CBC    Component Value Date/Time   WBC 6.3 04/05/2015 1016   RBC 4.53 04/05/2015 1016   RBC 4.91 01/18/2015 1219   HGB 14.8 04/05/2015 1016   HCT 44.3 04/05/2015 1016   PLT 75* 04/05/2015 1016   MCV 97.8 04/05/2015 1016   MCH 32.7 04/05/2015 1016   MCHC 33.4 04/05/2015 1016   RDW 14.0 04/05/2015 1016   LYMPHSABS 2.8 04/05/2015 1016   MONOABS 0.4 04/05/2015 1016   EOSABS 0.2 04/05/2015 1016   BASOSABS 0.1 04/05/2015 1016      Chemistry      Component Value Date/Time   NA 135 01/22/2015 1227   K 3.8 01/22/2015 1227   CL 101 01/22/2015 1227   CO2 26 01/22/2015 1227   BUN 13 01/22/2015 1227   CREATININE 0.95 01/22/2015 1227      Component Value Date/Time   CALCIUM 8.6* 01/22/2015 1227   ALKPHOS 67 04/05/2015 1016   AST 45* 04/05/2015 1016   ALT 24 04/05/2015 1016   BILITOT 0.9 04/05/2015 1016      04/19/2015 TSH at Dr. Sharyon MedicusFusco's office was 3.470.   PENDING LABS:   RADIOGRAPHIC STUDIES:  No results found.   PATHOLOGY:    ASSESSMENT AND PLAN:  Thrombocytopenia (HCC) Thrombocytopenia in the setting of Hep C.  Continue follow-up with GI as directed.  Initiation of Hep C treatment has been on hold due to abnormal TSH.  He reports noncomplaince with his levothyroxine around the time his TSH was checked by GI.  Since then, he has been more compliant.  On 10/6, he had his TSH checked by Dr. Sherwood GamblerFusco revealing a TSH WNL.  I will message Dorene Arerri Setzer, NP to let her know as she may want to see him sooner than 12/22 for follow-up and consideration of initiating treatment sooner rather than later.  His biggest mortality, comparing his hypothyroidism to Hep C infection is certainly his Hep C.  There should not be an absolute contraindication to Hep C treatment in the setting of hypothyroidism.  I will defer treatment decision making to GI.  Continue follow-up with PCP as directed.  Labs today:  CBC diff, CMET  Labs in 3 months: CBC diff, CMET  Return in 3 months for follow-up.   THERAPY PLAN:  Will follow counts.  No hematologic intervention needed at this time. Thrombocytopenia is secondary to active Hep C infection without any treatment, to date.  All questions were answered. The patient knows to call the clinic with any problems, questions or concerns. We can certainly see the patient much sooner if necessary.  Patient and plan discussed with Dr. Loma MessingShannon Penland and she is in agreement with the aforementioned.   This note is electronically signed by: Tina GriffithsKEFALAS,Ahnya Akre, PA-C 05/02/2015 3:46 PM

## 2015-05-02 NOTE — Progress Notes (Signed)
Jeremy DarlingJoseph M Lamadrid presents today for injection per MD orders. Flu Vaccine administered IM in right deltoid. Administration without incident. Patient tolerated well.  Jeremy DarlingJoseph M Njie presented for labwork. Labs per MD order drawn via Peripheral Line 23 gauge needle inserted in right hand  Good blood return present. Procedure without incident.  Needle removed intact. Patient tolerated procedure well.

## 2015-05-02 NOTE — Patient Instructions (Signed)
[facility] at [facility] Discharge Instructions  RECOMMENDATIONS MADE BY THE CONSULTANT AND ANY TEST RESULTS WILL BE SENT TO YOUR REFERRING PHYSICIAN.  Exam and discussion by Dellis Aneshomas Kefalas, PA-C Will give you a flu shot today. Labs today and if any concerns we will contact your Labs and office visit in 3 months.  Thank you for choosing [facility] at [facility] to provide your oncology and hematology care.  To afford each patient quality time with our provider, please arrive at least 15 minutes before your scheduled appointment time.    You need to re-schedule your appointment should you arrive 10 or more minutes late.  We strive to give you quality time with our providers, and arriving late affects you and other patients whose appointments are after yours.  Also, if you no show three or more times for appointments you may be dismissed from the clinic at the providers discretion.     Again, thank you for choosing [facility].  Our hope is that these requests will decrease the amount of time that you wait before being seen by our physicians.       _____________________________________________________________  Should you have questions after your visit to [facility], please contact our office at [phone] between the hours of 8:30 a.m. and 4:30 p.m.  Voicemails left after 4:30 p.m. will not be returned until the following business day.  For prescription refill requests, have your pharmacy contact our office.

## 2015-05-10 ENCOUNTER — Ambulatory Visit (HOSPITAL_COMMUNITY)
Admission: RE | Admit: 2015-05-10 | Discharge: 2015-05-10 | Disposition: A | Discharge status: Home / Self Care | Payer: Medicare Other | Source: Ambulatory Visit | Attending: Internal Medicine | Admitting: Internal Medicine

## 2015-05-10 ENCOUNTER — Telehealth (INDEPENDENT_AMBULATORY_CARE_PROVIDER_SITE_OTHER): Payer: Self-pay | Admitting: Internal Medicine

## 2015-05-10 DIAGNOSIS — B1920 Unspecified viral hepatitis C without hepatic coma: Secondary | ICD-10-CM

## 2015-05-10 DIAGNOSIS — B182 Chronic viral hepatitis C: Secondary | ICD-10-CM | POA: Diagnosis not present

## 2015-05-10 NOTE — Progress Notes (Signed)
We have sent this information in to his insurance and hopefully he will be treated with Harvoni.

## 2015-05-10 NOTE — Telephone Encounter (Signed)
noted 

## 2015-05-10 NOTE — Telephone Encounter (Signed)
   04/19/2015 TSH at Dr. Sharyon MedicusFusco's office was 3.470.

## 2015-05-10 NOTE — Telephone Encounter (Signed)
We have started the PA process. Patient will need to come and sign a readiness to treat form due to his insurance. Also, another drug urine test may be needed,last one was in July 2016, and was positive.

## 2015-05-10 NOTE — Telephone Encounter (Signed)
Jeremy Weeks, we are going to treat with Harvoni.

## 2015-05-11 ENCOUNTER — Encounter (INDEPENDENT_AMBULATORY_CARE_PROVIDER_SITE_OTHER): Payer: Self-pay

## 2015-06-22 DIAGNOSIS — F419 Anxiety disorder, unspecified: Secondary | ICD-10-CM | POA: Diagnosis not present

## 2015-06-22 DIAGNOSIS — Z1389 Encounter for screening for other disorder: Secondary | ICD-10-CM | POA: Diagnosis not present

## 2015-06-22 DIAGNOSIS — Z6829 Body mass index (BMI) 29.0-29.9, adult: Secondary | ICD-10-CM | POA: Diagnosis not present

## 2015-06-22 DIAGNOSIS — E663 Overweight: Secondary | ICD-10-CM | POA: Diagnosis not present

## 2015-06-25 ENCOUNTER — Telehealth (INDEPENDENT_AMBULATORY_CARE_PROVIDER_SITE_OTHER): Payer: Self-pay | Admitting: *Deleted

## 2015-06-25 ENCOUNTER — Telehealth (INDEPENDENT_AMBULATORY_CARE_PROVIDER_SITE_OTHER): Payer: Self-pay | Admitting: Internal Medicine

## 2015-06-25 DIAGNOSIS — B1920 Unspecified viral hepatitis C without hepatic coma: Secondary | ICD-10-CM | POA: Diagnosis not present

## 2015-06-25 NOTE — Telephone Encounter (Signed)
OV 07/05/2015

## 2015-06-25 NOTE — Telephone Encounter (Signed)
Labs

## 2015-06-25 NOTE — Telephone Encounter (Signed)
Per Camelia Engerri he needs Office visit after Christmas .  Called patient he already has an appt 12/22 at 9

## 2015-06-25 NOTE — Telephone Encounter (Signed)
Patient is getting second refill on Harvoni.  He believes is supposed to get labwork and and x-ray   Please advise   He is available to do this 06-25-11-15-2016

## 2015-06-26 LAB — HEPATIC FUNCTION PANEL
ALT: 8 U/L — ABNORMAL LOW (ref 9–46)
AST: 17 U/L (ref 10–35)
Albumin: 4 g/dL (ref 3.6–5.1)
Alkaline Phosphatase: 43 U/L (ref 40–115)
BILIRUBIN DIRECT: 0.2 mg/dL (ref <=0.2)
Indirect Bilirubin: 0.4 mg/dL (ref 0.2–1.2)
Total Bilirubin: 0.6 mg/dL (ref 0.2–1.2)
Total Protein: 6.9 g/dL (ref 6.1–8.1)

## 2015-06-27 LAB — CBC WITH DIFFERENTIAL/PLATELET
Basophils Absolute: 0.1 10*3/uL (ref 0.0–0.1)
Basophils Relative: 1 % (ref 0–1)
EOS ABS: 0.2 10*3/uL (ref 0.0–0.7)
Eosinophils Relative: 3 % (ref 0–5)
HCT: 41 % (ref 39.0–52.0)
HEMOGLOBIN: 14.8 g/dL (ref 13.0–17.0)
Lymphocytes Relative: 49 % — ABNORMAL HIGH (ref 12–46)
Lymphs Abs: 3.3 10*3/uL (ref 0.7–4.0)
MCH: 34.1 pg — ABNORMAL HIGH (ref 26.0–34.0)
MCHC: 36.1 g/dL — AB (ref 30.0–36.0)
MCV: 94.5 fL (ref 78.0–100.0)
MPV: 12.6 fL — ABNORMAL HIGH (ref 8.6–12.4)
Monocytes Absolute: 0.5 10*3/uL (ref 0.1–1.0)
Monocytes Relative: 8 % (ref 3–12)
NEUTROS ABS: 2.6 10*3/uL (ref 1.7–7.7)
NEUTROS PCT: 39 % — AB (ref 43–77)
PLATELETS: 84 10*3/uL — AB (ref 150–400)
RBC: 4.34 MIL/uL (ref 4.22–5.81)
RDW: 13.2 % (ref 11.5–15.5)
WBC: 6.7 10*3/uL (ref 4.0–10.5)

## 2015-06-27 LAB — HEPATITIS C RNA QUANTITATIVE

## 2015-07-05 ENCOUNTER — Telehealth (INDEPENDENT_AMBULATORY_CARE_PROVIDER_SITE_OTHER): Payer: Self-pay | Admitting: *Deleted

## 2015-07-05 ENCOUNTER — Ambulatory Visit (INDEPENDENT_AMBULATORY_CARE_PROVIDER_SITE_OTHER): Payer: Medicare Other | Admitting: Internal Medicine

## 2015-07-05 ENCOUNTER — Encounter (INDEPENDENT_AMBULATORY_CARE_PROVIDER_SITE_OTHER): Payer: Self-pay | Admitting: Internal Medicine

## 2015-07-05 VITALS — BP 124/80 | HR 72 | Temp 97.6°F | Ht 72.0 in | Wt 216.7 lb

## 2015-07-05 DIAGNOSIS — B182 Chronic viral hepatitis C: Secondary | ICD-10-CM

## 2015-07-05 DIAGNOSIS — B1710 Acute hepatitis C without hepatic coma: Secondary | ICD-10-CM

## 2015-07-05 NOTE — Telephone Encounter (Signed)
.  Per Jeremy Renderri Setzer,NP patient is to have lab work 8 weeks.

## 2015-07-05 NOTE — Progress Notes (Addendum)
Subjective:    Patient ID: Jeremy DarlingJoseph M Weeks, male    DOB: 07/05/1961, 54 y.o.   MRN: [id number]  HPIHere today for f/u of his Hepatitis C.  This is week 5 for him. Tx with Harvoni Genotype 1B.  05/10/2015 US Elast F3-F4. 06/25/2015 Hep C quaint less than 15. He tells me he is doing good. He says for the past couple of days he has had some pick up and go. He has chronic back pain and arthritis. He says his appetite is good. He has gained 7 pounds since September. BMs are normal. No melena or BRRB. No rashes. No GI symptoms.  Hepatic Function Panel     Component Value Date/Time   PROT 6.9 06/25/2015 1006   ALBUMIN 4.0 06/25/2015 1006   AST 17 06/25/2015 1006   ALT 8* 06/25/2015 1006   ALKPHOS 43 06/25/2015 1006   BILITOT 0.6 06/25/2015 1006   BILIDIR 0.2 06/25/2015 1006   IBILI 0.4 06/25/2015 1006    CBC    Component Value Date/Time   WBC 6.7 06/25/2015 1006   RBC 4.34 06/25/2015 1006   RBC 4.91 01/18/2015 1219   HGB 14.8 06/25/2015 1006   HCT 41.0 06/25/2015 1006   PLT 84* 06/25/2015 1006   MCV 94.5 06/25/2015 1006   MCH 34.1* 06/25/2015 1006   MCHC 36.1* 06/25/2015 1006   RDW 13.2 06/25/2015 1006   LYMPHSABS 3.3 06/25/2015 1006   MONOABS 0.5 06/25/2015 1006   EOSABS 0.2 06/25/2015 1006   BASOSABS 0.1 06/25/2015 1006         Review of Systems Past Medical History  Diagnosis Date  . Thyroid disease   . Back pain   . Thrombocytopenia (HCC) 01/22/2015  . Hepatitis C     Past Surgical History  Procedure Laterality Date  . Cholecystectomy    . Back surgery      Allergies  Allergen Reactions  . Codeine Rash    Current Outpatient Prescriptions on File Prior to Visit  Medication Sig Dispense Refill  . ALPRAZolam (XANAX) 1 MG tablet Take 1 mg by mouth 2 (two) times daily.    Marland Kitchen. aspirin EC 81 MG tablet Take 81 mg by mouth every morning.    Marland Kitchen. levothyroxine (SYNTHROID, LEVOTHROID) 112 MCG tablet Take 112 mcg by mouth daily before breakfast.    .  oxyCODONE-acetaminophen (PERCOCET) 10-325 MG per tablet Take 1 tablet by mouth 3 (three) times daily as needed for pain.    . traZODone (DESYREL) 50 MG tablet Take 50 mg by mouth at bedtime.    . vitamin E (VITAMIN E) 400 UNIT capsule Take 400 Units by mouth every morning.     No current facility-administered medications on file prior to visit.        Objective:   Physical Exam Blood pressure 124/80, pulse 72, temperature 97.6 F (36.4 C), height 6' (1.829 m), weight 216 lb 11.2 oz (98.294 kg). Alert and oriented. Skin warm and dry. Oral mucosa is moist.   . Sclera anicteric, conjunctivae is pink. Thyroid not enlarged. No cervical lymphadenopathy. Lungs clear. Heart regular rate and rhythm.  Abdomen is soft. Bowel sounds are positive. No hepatomegaly. No abdominal masses felt. No tenderness.  No edema to lower extremities         Assessment & Plan:  Hepatitis C. He has almost cleared the virus. He is doing well. Hep C quaint.  OV in 8 weeks with labs. (Hep C quaint, CBC with diff, Hepatic function) Will repeat  Quaint today. OV in 8 weeks.

## 2015-07-05 NOTE — Patient Instructions (Addendum)
OV in 8 weeks with labs.  

## 2015-07-06 LAB — HEPATITIS C RNA QUANTITATIVE
HCV Quantitative Log: <1.18 {Log} (ref <1.18)
HCV Quantitative: <15 IU/mL (ref <15)

## 2015-07-13 DIAGNOSIS — Z6829 Body mass index (BMI) 29.0-29.9, adult: Secondary | ICD-10-CM | POA: Diagnosis not present

## 2015-07-13 DIAGNOSIS — E663 Overweight: Secondary | ICD-10-CM | POA: Diagnosis not present

## 2015-07-13 DIAGNOSIS — F419 Anxiety disorder, unspecified: Secondary | ICD-10-CM | POA: Diagnosis not present

## 2015-07-13 DIAGNOSIS — Z1389 Encounter for screening for other disorder: Secondary | ICD-10-CM | POA: Diagnosis not present

## 2015-07-13 DIAGNOSIS — E063 Autoimmune thyroiditis: Secondary | ICD-10-CM | POA: Diagnosis not present

## 2015-07-13 DIAGNOSIS — G894 Chronic pain syndrome: Secondary | ICD-10-CM | POA: Diagnosis not present

## 2015-07-17 ENCOUNTER — Telehealth (INDEPENDENT_AMBULATORY_CARE_PROVIDER_SITE_OTHER): Payer: Self-pay | Admitting: *Deleted

## 2015-07-17 NOTE — Telephone Encounter (Signed)
Patient's sister Marlowe SaxDiane Ragland has POA (scanned), please give her a call sometime Thursday at [phone] to go over his condition

## 2015-07-17 NOTE — Telephone Encounter (Signed)
Advised 

## 2015-08-03 ENCOUNTER — Encounter (HOSPITAL_COMMUNITY): Payer: Self-pay | Admitting: Oncology

## 2015-08-03 ENCOUNTER — Encounter (HOSPITAL_COMMUNITY): Payer: Medicare Other | Attending: Oncology | Admitting: Oncology

## 2015-08-03 ENCOUNTER — Encounter (HOSPITAL_BASED_OUTPATIENT_CLINIC_OR_DEPARTMENT_OTHER): Payer: Medicare Other

## 2015-08-03 VITALS — BP 132/85 | HR 69 | Temp 97.6°F | Resp 18 | Wt 220.0 lb

## 2015-08-03 DIAGNOSIS — D696 Thrombocytopenia, unspecified: Secondary | ICD-10-CM | POA: Diagnosis not present

## 2015-08-03 LAB — CBC WITH DIFFERENTIAL/PLATELET
BASOS PCT: 0 %
Basophils Absolute: 0 10*3/uL (ref 0.0–0.1)
Eosinophils Absolute: 0.2 10*3/uL (ref 0.0–0.7)
Eosinophils Relative: 3 %
HEMATOCRIT: 42.7 % (ref 39.0–52.0)
HEMOGLOBIN: 14.1 g/dL (ref 13.0–17.0)
LYMPHS ABS: 2.7 10*3/uL (ref 0.7–4.0)
Lymphocytes Relative: 47 %
MCH: 31.5 pg (ref 26.0–34.0)
MCHC: 33 g/dL (ref 30.0–36.0)
MCV: 95.3 fL (ref 78.0–100.0)
MONOS PCT: 7 %
Monocytes Absolute: 0.4 10*3/uL (ref 0.1–1.0)
NEUTROS ABS: 2.5 10*3/uL (ref 1.7–7.7)
Neutrophils Relative %: 44 %
Platelets: 58 10*3/uL — ABNORMAL LOW (ref 150–400)
RBC: 4.48 MIL/uL (ref 4.22–5.81)
RDW: 12.3 % (ref 11.5–15.5)
WBC: 5.8 10*3/uL (ref 4.0–10.5)

## 2015-08-03 LAB — COMPREHENSIVE METABOLIC PANEL
ALBUMIN: 3.7 g/dL (ref 3.5–5.0)
ALK PHOS: 48 U/L (ref 38–126)
ALT: 13 U/L — ABNORMAL LOW (ref 17–63)
AST: 24 U/L (ref 15–41)
Anion gap: 8 (ref 5–15)
BUN: 14 mg/dL (ref 6–20)
CO2: 30 mmol/L (ref 22–32)
Calcium: 9.6 mg/dL (ref 8.9–10.3)
Chloride: 102 mmol/L (ref 101–111)
Creatinine, Ser: 0.76 mg/dL (ref 0.61–1.24)
GFR calc Af Amer: >60 mL/min (ref >60)
GLUCOSE: 96 mg/dL (ref 65–99)
Potassium: 3.9 mmol/L (ref 3.5–5.1)
Sodium: 140 mmol/L (ref 135–145)
TOTAL PROTEIN: 7.2 g/dL (ref 6.5–8.1)
Total Bilirubin: 0.6 mg/dL (ref 0.3–1.2)

## 2015-08-03 NOTE — Patient Instructions (Signed)
..  [facility] at [facility] Discharge Instructions  RECOMMENDATIONS MADE BY THE CONSULTANT AND ANY TEST RESULTS WILL BE SENT TO YOUR REFERRING PHYSICIAN.  Labs in 6 months Return in 6 months to see Dr. Galen Manila  Thank you for choosing [facility] at [facility] to provide your oncology and hematology care.  To afford each patient quality time with our provider, please arrive at least 15 minutes before your scheduled appointment time.   Beginning January 23rd 2017 lab work for the [facility] will be done in the  Main lab at [facility] on 1st floor. If you have a lab appointment with the Cancer Center please come in thru the  Main Entrance and check in at the main information desk  You need to re-schedule your appointment should you arrive 10 or more minutes late.  We strive to give you quality time with our providers, and arriving late affects you and other patients whose appointments are after yours.  Also, if you no show three or more times for appointments you may be dismissed from the clinic at the providers discretion.     Again, thank you for choosing [facility].  Our hope is that these requests will decrease the amount of time that you wait before being seen by our physicians.       _____________________________________________________________  Should you have questions after your visit to [facility], please contact our office at [phone] between the hours of 8:30 a.m. and 4:30 p.m.  Voicemails left after 4:30 p.m. will not be returned until the following business day.  For prescription refill requests, have your pharmacy contact our office.

## 2015-08-03 NOTE — Progress Notes (Signed)
..  Jeremy Weeks's reason for visit today are for labs as scheduled per MD orders.  Venipuncture performed with a 23 gauge butterfly needle to R Antecubital.  Jeremy Weeks tolerated venipuncture well and without incident; questions were answered and patient was discharged.

## 2015-08-03 NOTE — Assessment & Plan Note (Signed)
Thrombocytopenia in the setting of Hep C, currently on Harvoni treatment managed by Dr. Keane Police, NP  Continue follow-up with PCP and GI as directed.  Labs today: CBC diff, CMET  Labs in 6 months: CBC  Return in 6 months for follow-up.

## 2015-08-03 NOTE — Progress Notes (Signed)
Jeremy Weeks., MD [address] [place] [address]  Thrombocytopenia [facility])  CURRENT THERAPY: Observation, on active treatment for Hep C with Harvoni  INTERVAL HISTORY: Jeremy Weeks 55 y.o. male returns for followup of long-standing thrombocytopenia, recently found to be Hep C positive during hematologic work-up for thrombocytopenia.  Now on Harvoni treatment.  I personally reviewed and went over laboratory results with the patient.  The results are noted within this dictation.  We will update labs today.  He denies any bleeding or easy bruising.  He is tolerating Harvoni without complaints he notes.  He admits to increased energy and stamina.  He is pleased with his clinical progress since starting Harvoni.  His sister accompanied him today.  I discussed his diagnosis with her, with Mateen's permission.  I reviewed the reason he is seeing [place].  She is educated on the patient's path to diagnosis of Hep C.  She is educated on cirrhosis of liver and thrombocytopenia.  Past Medical History  Diagnosis Date  . Thyroid disease   . Back pain   . Thrombocytopenia (HCC) 01/22/2015  . Hepatitis C     has Thrombocytopenia (HCC) and Hepatitis C on his problem list.     is allergic to codeine.  Current Outpatient Prescriptions on File Prior to Visit  Medication Sig Dispense Refill  . ALPRAZolam (XANAX) 1 MG tablet Take 1 mg by mouth 2 (two) times daily.    Marland Kitchen aspirin EC 81 MG tablet Take 81 mg by mouth every morning.    . Ledipasvir-Sofosbuvir (HARVONI) 90-400 MG TABS Take by mouth daily.    Marland Kitchen levothyroxine (SYNTHROID, LEVOTHROID) 112 MCG tablet Take 112 mcg by mouth daily before breakfast.    . oxyCODONE-acetaminophen (PERCOCET) 10-325 MG per tablet Take 1 tablet by mouth 3 (three) times daily as needed for pain.    . traZODone (DESYREL) 50 MG tablet Take 50 mg by mouth at bedtime.    . vitamin E (VITAMIN E) 400 UNIT capsule Take 400 Units by mouth every morning.       No current facility-administered medications on file prior to visit.    Past Surgical History  Procedure Laterality Date  . Cholecystectomy    . Back surgery      Denies any headaches, dizziness, double vision, fevers, chills, night sweats, nausea, vomiting, diarrhea, constipation, chest pain, heart palpitations, shortness of breath, blood in stool, black tarry stool, urinary pain, urinary burning, urinary frequency, hematuria.   PHYSICAL EXAMINATION  ECOG PERFORMANCE STATUS: 1 - Symptomatic but completely ambulatory  Filed Vitals:   08/03/15 1423  BP: 132/85  Pulse: 69  Temp: 97.6 F (36.4 C)  Resp: 18    GENERAL:alert, no distress, well nourished, well developed, comfortable, cooperative, obese, smiling and accompanied by his sister. SKIN: skin color, texture, turgor are normal, no rashes or significant lesions HEAD: Normocephalic, No masses, lesions, tenderness or abnormalities EYES: normal, PERRLA, EOMI, Conjunctiva are pink and non-injected EARS: External ears normal OROPHARYNX:lips, buccal mucosa, and tongue normal and mucous membranes are moist  NECK: supple, no adenopathy, trachea midline LYMPH:  no palpable lymphadenopathy BREAST:not examined LUNGS: positive findings: wheezing  bilaterally HEART: regular rate & rhythm, no murmurs and no gallops ABDOMEN:abdomen soft, non-tender, obese and normal bowel sounds BACK: Back symmetric, no curvature., No CVA tenderness EXTREMITIES:less then 2 second capillary refill, no joint deformities, effusion, or inflammation, no edema, no skin discoloration, no cyanosis  NEURO: alert & oriented x 3 with fluent  speech, no focal motor/sensory deficits   LABORATORY DATA: CBC    Component Value Date/Time   WBC 6.7 06/25/2015 1006   RBC 4.34 06/25/2015 1006   RBC 4.91 01/18/2015 1219   HGB 14.8 06/25/2015 1006   HCT 41.0 06/25/2015 1006   PLT 84* 06/25/2015 1006   MCV 94.5 06/25/2015 1006   MCH 34.1* 06/25/2015 1006   MCHC  36.1* 06/25/2015 1006   RDW 13.2 06/25/2015 1006   LYMPHSABS 3.3 06/25/2015 1006   MONOABS 0.5 06/25/2015 1006   EOSABS 0.2 06/25/2015 1006   BASOSABS 0.1 06/25/2015 1006      Chemistry      Component Value Date/Time   NA 137 05/02/2015 1610   K 4.1 05/02/2015 1610   CL 101 05/02/2015 1610   CO2 27 05/02/2015 1610   BUN 12 05/02/2015 1610   CREATININE 0.95 05/02/2015 1610      Component Value Date/Time   CALCIUM 9.3 05/02/2015 1610   ALKPHOS 43 06/25/2015 1006   AST 17 06/25/2015 1006   ALT 8* 06/25/2015 1006   BILITOT 0.6 06/25/2015 1006       PENDING LABS:   RADIOGRAPHIC STUDIES:  CLINICAL DATA: Hepatitis-C.  EXAM: ULTRASOUND ABDOMEN COMPLETE  ULTRASOUND HEPATIC ELASTOGRAPHY  TECHNIQUE: Sonography of the upper abdomen was performed. In addition, ultrasound elastography evaluation of the liver was performed. A region of interest was placed within the right lobe of the liver. Following application of a compressive sonographic pulse, shear waves were detected in the adjacent hepatic tissue and the shear wave velocity was calculated. Multiple assessments were performed at the selected site. Median shear wave velocity is correlated to a Metavir fibrosis score.  COMPARISON: 02/08/2015 abdominal sonogram.  FINDINGS: ULTRASOUND ABDOMEN  Gallbladder: Status postcholecystectomy.  Common bile duct: Diameter: 8 mm.  Liver: Liver parenchyma is mildly coarsened in echotexture and liver surface appears finely irregular, suggesting cirrhosis. No liver mass detected.  IVC: No abnormality visualized.  Pancreas: Visualized portion unremarkable.  Spleen: Top normal size spleen. Craniocaudal splenic length 12.0 cm. No splenic mass.  Right Kidney: Length: 9.6 cm. Echogenicity within normal limits. No mass or hydronephrosis visualized.  Left Kidney: Length: 10.2 cm. Echogenicity within normal limits. Simple 3.2 x 2.7 by 2.7 cm renal cyst in the  upper left kidney. No mass or hydronephrosis visualized.  Abdominal aorta: No aneurysm visualized.  Other findings: None.  ULTRASOUND HEPATIC ELASTOGRAPHY  Device: Siemens Helix VTQ  Transducer 6C1  Patient position: Left lateral decubitus  Number of measurements: 10  Hepatic Segment: 8  Median velocity: 3.1 m/sec  IQR: 0.28  IQR/Median velocity ratio 0.09  Corresponding Metavir fibrosis score: Some F3 + F4  Risk of fibrosis: High  Limitations of exam: None  Pertinent findings noted on other imaging exams: None  Please note that abnormal shear wave velocities may also be identified in clinical settings other than with hepatic fibrosis, such as: acute hepatitis, elevated right heart and central venous pressures including use of beta blockers, veno-occlusive disease (Budd-Chiari), infiltrative processes such as mastocytosis/amyloidosis/infiltrative tumor, extrahepatic cholestasis, in the post-prandial state, and liver transplantation. Correlation with patient history, laboratory data, and clinical condition recommended.  IMPRESSION: 1. Coarsened liver echotexture with mildly irregular liver surface, suggesting cirrhosis. No liver mass detected. 2. Top-normal size spleen. 3. Status post cholecystectomy. Common bile duct diameter 8 mm, within expected post cholecystectomy limits. 4. Simple left renal cyst. 5. Hepatic elastography results: Median hepatic shear wave velocity is calculated at 3.1 m/sec.  Corresponding Metavir fibrosis score  is Some F3 + F4.  Risk of fibrosis is high.  Follow-up: Follow-up advised   Electronically Signed  By: Delbert Phenix M.D.  On: 05/10/2015 09:47   PATHOLOGY:    ASSESSMENT AND PLAN:  Thrombocytopenia (HCC) Thrombocytopenia in the setting of Hep C, currently on Harvoni treatment managed by Dr. Keane Police, NP  Continue follow-up with PCP and GI as directed.  Labs today: CBC  diff, CMET  Labs in 6 months: CBC  Return in 6 months for follow-up.   THERAPY PLAN:  Will follow counts.  Continue follow-up with GI for Hep C treatment/management.  All questions were answered. The patient knows to call the clinic with any problems, questions or concerns. We can certainly see the patient much sooner if necessary.  Patient and plan discussed with Dr. Loma Messing and she is in agreement with the aforementioned.   This note is electronically signed by: Tina Griffiths 08/03/2015 2:36 PM

## 2015-08-03 NOTE — Addendum Note (Signed)
Addended by: Edythe Lynn A on: 08/03/2015 05:03 PM   Modules accepted: Orders

## 2015-08-06 ENCOUNTER — Encounter (INDEPENDENT_AMBULATORY_CARE_PROVIDER_SITE_OTHER): Payer: Self-pay | Admitting: *Deleted

## 2015-08-06 ENCOUNTER — Other Ambulatory Visit (INDEPENDENT_AMBULATORY_CARE_PROVIDER_SITE_OTHER): Payer: Self-pay | Admitting: *Deleted

## 2015-08-06 DIAGNOSIS — B182 Chronic viral hepatitis C: Secondary | ICD-10-CM

## 2015-08-30 ENCOUNTER — Encounter (INDEPENDENT_AMBULATORY_CARE_PROVIDER_SITE_OTHER): Payer: Self-pay | Admitting: Internal Medicine

## 2015-08-30 ENCOUNTER — Ambulatory Visit (INDEPENDENT_AMBULATORY_CARE_PROVIDER_SITE_OTHER): Payer: Medicare Other | Admitting: Internal Medicine

## 2015-08-30 VITALS — BP 142/80 | HR 72 | Temp 97.0°F | Ht 72.0 in | Wt 218.7 lb

## 2015-08-30 DIAGNOSIS — B1710 Acute hepatitis C without hepatic coma: Secondary | ICD-10-CM | POA: Diagnosis not present

## 2015-08-30 DIAGNOSIS — B182 Chronic viral hepatitis C: Secondary | ICD-10-CM | POA: Diagnosis not present

## 2015-08-30 NOTE — Progress Notes (Addendum)
   Subjective:    Patient ID: Jeremy Weeks, male    DOB: 1961/06/05, 55 y.o.   MRN: [id number]  HPI Here today for f/u of his His Hepatitis C. Treated with Harvoni x 12 weeks. Started June 01, 2015. He has completed treatment.  In December, his viral load less than 15.  Appetite is good.  No weight loss.  He tells me he is doing good. He feels better. No rash. He denies any joint pain.   Genotype 1B.  05/10/2015 [place] elast F3-F4   CBC    Component Value Date/Time   WBC 5.8 08/03/2015 1330   RBC 4.48 08/03/2015 1330   RBC 4.91 01/18/2015 1219   HGB 14.1 08/03/2015 1330   HCT 42.7 08/03/2015 1330   PLT 58* 08/03/2015 1330   MCV 95.3 08/03/2015 1330   MCH 31.5 08/03/2015 1330   MCHC 33.0 08/03/2015 1330   RDW 12.3 08/03/2015 1330   LYMPHSABS 2.7 08/03/2015 1330   MONOABS 0.4 08/03/2015 1330   EOSABS 0.2 08/03/2015 1330   BASOSABS 0.0 08/03/2015 1330    Hepatic Function Panel     Component Value Date/Time   PROT 7.2 08/03/2015 1330   ALBUMIN 3.7 08/03/2015 1330   AST 24 08/03/2015 1330   ALT 13* 08/03/2015 1330   ALKPHOS 48 08/03/2015 1330   BILITOT 0.6 08/03/2015 1330   BILIDIR 0.2 06/25/2015 1006   IBILI 0.4 06/25/2015 1006        Review of Systems     Past Medical History  Diagnosis Date  . Thyroid disease   . Back pain   . Thrombocytopenia (HCC) 01/22/2015  . Hepatitis C     Past Surgical History  Procedure Laterality Date  . Cholecystectomy    . Back surgery      Allergies  Allergen Reactions  . Codeine Rash    Current Outpatient Prescriptions on File Prior to Visit  Medication Sig Dispense Refill  . ALPRAZolam (XANAX) 1 MG tablet Take 1 mg by mouth 2 (two) times daily.    Marland Kitchen aspirin EC 81 MG tablet Take 81 mg by mouth every morning.    Marland Kitchen levothyroxine (SYNTHROID, LEVOTHROID) 112 MCG tablet Take 112 mcg by mouth daily before breakfast.    . oxyCODONE-acetaminophen (PERCOCET) 10-325 MG per tablet Take 1 tablet by mouth 3 (three) times daily  as needed for pain.    . traZODone (DESYREL) 50 MG tablet Take 50 mg by mouth at bedtime.    . vitamin E (VITAMIN E) 400 UNIT capsule Take 400 Units by mouth every morning.     No current facility-administered medications on file prior to visit.     Objective:   Physical Exam Blood pressure 142/80, pulse 72, temperature 97 F (36.1 C), height 6' (1.829 m), weight 218 lb 11.2 oz (99.202 kg). Alert and oriented. Skin warm and dry. Oral mucosa is moist.   . Sclera anicteric, conjunctivae is pink. Thyroid not enlarged. No cervical lymphadenopathy. Lungs clear. Heart regular rate and rhythm.  Abdomen is soft. Bowel sounds are positive. No hepatomegaly. No abdominal masses felt. No tenderness.  No edema to lower extremities.          Assessment & Plan:  Hepatitis C. Will have labs drawn today for Hep C quaint, Hepatic function and CBC Thrombocytopenia probably related to his liver disease. Is followed by the [facility] in 1 year.

## 2015-08-30 NOTE — Patient Instructions (Addendum)
OV in 1 year. Will get a CBC, Hepatic function and Hep C quaint today

## 2015-08-31 LAB — CBC WITH DIFFERENTIAL/PLATELET
Basophils Absolute: 0.1 10*3/uL (ref 0.0–0.1)
Basophils Relative: 1 % (ref 0–1)
EOS ABS: 0.2 10*3/uL (ref 0.0–0.7)
EOS PCT: 3 % (ref 0–5)
HCT: 45.7 % (ref 39.0–52.0)
Hemoglobin: 15.1 g/dL (ref 13.0–17.0)
LYMPHS ABS: 3 10*3/uL (ref 0.7–4.0)
Lymphocytes Relative: 58 % — ABNORMAL HIGH (ref 12–46)
MCH: 30.9 pg (ref 26.0–34.0)
MCHC: 33 g/dL (ref 30.0–36.0)
MCV: 93.6 fL (ref 78.0–100.0)
MONO ABS: 0.4 10*3/uL (ref 0.1–1.0)
MONOS PCT: 8 % (ref 3–12)
MPV: 12.9 fL — ABNORMAL HIGH (ref 8.6–12.4)
NEUTROS PCT: 30 % — AB (ref 43–77)
Neutro Abs: 1.5 10*3/uL — ABNORMAL LOW (ref 1.7–7.7)
PLATELETS: 73 10*3/uL — AB (ref 150–400)
RBC: 4.88 MIL/uL (ref 4.22–5.81)
RDW: 13.1 % (ref 11.5–15.5)
WBC: 5.1 10*3/uL (ref 4.0–10.5)

## 2015-08-31 LAB — HEPATITIS C RNA QUANTITATIVE: HCV Quantitative: NOT DETECTED IU/mL (ref <15)

## 2015-08-31 LAB — HEPATIC FUNCTION PANEL
ALT: 9 U/L (ref 9–46)
AST: 18 U/L (ref 10–35)
Albumin: 4.1 g/dL (ref 3.6–5.1)
Alkaline Phosphatase: 58 U/L (ref 40–115)
BILIRUBIN DIRECT: 0.2 mg/dL (ref <=0.2)
Indirect Bilirubin: 0.5 mg/dL (ref 0.2–1.2)
TOTAL PROTEIN: 7.5 g/dL (ref 6.1–8.1)
Total Bilirubin: 0.7 mg/dL (ref 0.2–1.2)

## 2015-10-01 DIAGNOSIS — G4700 Insomnia, unspecified: Secondary | ICD-10-CM | POA: Diagnosis not present

## 2015-10-01 DIAGNOSIS — Z1389 Encounter for screening for other disorder: Secondary | ICD-10-CM | POA: Diagnosis not present

## 2015-10-01 DIAGNOSIS — Z23 Encounter for immunization: Secondary | ICD-10-CM | POA: Diagnosis not present

## 2015-10-01 DIAGNOSIS — G894 Chronic pain syndrome: Secondary | ICD-10-CM | POA: Diagnosis not present

## 2015-10-01 DIAGNOSIS — E063 Autoimmune thyroiditis: Secondary | ICD-10-CM | POA: Diagnosis not present

## 2015-10-01 DIAGNOSIS — Z125 Encounter for screening for malignant neoplasm of prostate: Secondary | ICD-10-CM | POA: Diagnosis not present

## 2015-12-24 DIAGNOSIS — G894 Chronic pain syndrome: Secondary | ICD-10-CM | POA: Diagnosis not present

## 2015-12-24 DIAGNOSIS — E063 Autoimmune thyroiditis: Secondary | ICD-10-CM | POA: Diagnosis not present

## 2015-12-24 DIAGNOSIS — I10 Essential (primary) hypertension: Secondary | ICD-10-CM | POA: Diagnosis not present

## 2015-12-24 DIAGNOSIS — Z1389 Encounter for screening for other disorder: Secondary | ICD-10-CM | POA: Diagnosis not present

## 2016-01-04 DIAGNOSIS — Z1211 Encounter for screening for malignant neoplasm of colon: Secondary | ICD-10-CM | POA: Diagnosis not present

## 2016-01-30 NOTE — Progress Notes (Signed)
[facility] Hematology/Oncology Progress  Name: Jeremy Weeks      MRN: [id number]  Date: 02/26/2016 Time:8:11 PM   REFERRING PHYSICIAN:  Collene Mares, PA-C   DIAGNOSIS:   Thrombocytopenia, in the setting for normal hemoglobin and WBC. Hepatitis C, having completed harvoni 2017 Cirrhosis by ultrasound imaging Borderline splenomegaly  HISTORY OF PRESENT ILLNESS:   Jeremy Weeks is a 55 year old white American man who presents for ongoing follow-up of thrombocytopenia. He has known cirrhosis. He had hepatitis C when we first met. He has completed harvoni with Dr. Laural Golden and Karna Christmas.   He has no major complaints today.  Denies bleeding or bruising. Weight is up and appetite is good. He sleeps well. Chronic back pain.    PAST MEDICAL HISTORY:   Past Medical History:  Diagnosis Date  . Back pain   . Hepatitis C   . Thrombocytopenia ([place]) 01/22/2015  . Thyroid disease     ALLERGIES: Allergies  Allergen Reactions  . Codeine Rash      MEDICATIONS: I have reviewed the patient's current medications.    Current Outpatient Prescriptions on File Prior to Visit  Medication Sig Dispense Refill  . ALPRAZolam (XANAX) 1 MG tablet Take 1 mg by mouth 2 (two) times daily.    Marland Kitchen aspirin EC 81 MG tablet Take 81 mg by mouth every morning.    Marland Kitchen levothyroxine (SYNTHROID, LEVOTHROID) 112 MCG tablet Take 112 mcg by mouth daily before breakfast.    . oxyCODONE-acetaminophen (PERCOCET) 10-325 MG per tablet Take 1 tablet by mouth 3 (three) times daily as needed for pain.    . vitamin E (VITAMIN E) 400 UNIT capsule Take 400 Units by mouth every morning.     No current facility-administered medications on file prior to visit.      PAST SURGICAL HISTORY Past Surgical History:  Procedure Laterality Date  . BACK SURGERY    . CHOLECYSTECTOMY      FAMILY HISTORY: Mother is deceased in her 92's secondary to hip surgery (odd history from patient).  Father deceased in his 64'B from  complications of Parkinson's Disease.  He has no children.  SOCIAL HISTORY:  reports that he has been smoking Cigarettes.  He has a 37.00 pack-year smoking history. He has never used smokeless tobacco. He reports that he does not drink alcohol or use drugs.  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  He admits to smoking 1/2 ppd x 40 years, but he smoked up to 2 ppd.  Additionally, he admits to a history of illicit drug abuse in his 72's and 30's.  He is single.  He had a long time girlfriend but they broke up because he found the [place] and she did not, so they could not be together.  He is on disability from his back.  He used to work in [employer].  PERFORMANCE STATUS: The patient's performance status is 0 - Asymptomatic  PHYSICAL EXAM: Most Recent Vital Signs: Blood pressure 133/86, pulse (!) 58, resp. rate 16, height 6' (1.829 m), weight 226 lb 4.8 oz (102.6 kg), SpO2 97 %. General appearance: alert, cooperative and no distress Head: Normocephalic, without obvious abnormality, atraumatic Eyes: negative findings: lids and lashes normal, conjunctivae and sclerae normal and corneas clear Neck: no adenopathy and supple, symmetrical, trachea midline Lungs: clear to auscultation bilaterally and normal percussion bilaterally Heart:  regular rate and rhythm, S1, S2 normal, no murmur, click, rub or gallop Abdomen: normal findings: no masses palpable and soft, non-tender  Extremities: extremities normal, atraumatic, no cyanosis or edema Skin: Skin color, texture, turgor normal. No rashes or lesions Lymph nodes: Cervical, supraclavicular, and axillary nodes normal. Neurologic: Grossly normal  LABORATORY DATA:  I have reviewed the data as listed. CBC    Component Value Date/Time   WBC 6.8 01/31/2016 1423   RBC 4.47 01/31/2016 1423   HGB 14.4 01/31/2016 1423   HCT 42.8 01/31/2016 1423   PLT 71 (L) 01/31/2016 1423    MCV 95.7 01/31/2016 1423   MCH 32.2 01/31/2016 1423   MCHC 33.6 01/31/2016 1423   RDW 13.2 01/31/2016 1423   LYMPHSABS 2.8 01/31/2016 1423   MONOABS 0.5 01/31/2016 1423   EOSABS 0.2 01/31/2016 1423   BASOSABS 0.0 01/31/2016 1423      Chemistry      Component Value Date/Time   NA 140 08/03/2015 1330   K 3.9 08/03/2015 1330   CL 102 08/03/2015 1330   CO2 30 08/03/2015 1330   BUN 14 08/03/2015 1330   CREATININE 0.76 08/03/2015 1330      Component Value Date/Time   CALCIUM 9.6 08/03/2015 1330   ALKPHOS 58 08/30/2015 1025   AST 18 08/30/2015 1025   ALT 9 08/30/2015 1025   BILITOT 0.7 08/30/2015 1025     Results for Jeremy Weeks (MRN [id number])   Ref. Range 01/22/2015 12:27 04/05/2015 10:16 05/02/2015 16:10 06/25/2015 10:06 08/03/2015 13:30 08/30/2015 10:27 01/31/2016 14:23  Platelets Latest Ref Range: 150 - 400 K/uL 126 (L) 75 (L) 56 (L) 84 (L) 58 (L) 73 (L) 71 (L)    ASSESSMENT:  1. Thrombocytopenia, dating back to at least 2009, secondary to cirrhosis, splenomegaly 2. Chronic back pain 3. Hypothyroidism 4. Insomnia 5. History of alcohol use 6. History of illicit drug use 7. Hepatitis C positive now having completed Harvoni early 2017 8. Cirrhosis , borderline splenomegaly  PLAN:   I discussed with the patient that I suspect he has mild thrombocytopenia from his borderline splenomegaly but also evidence of cirrhosis. I advised him that I would recommend ongoing observation of his counts. They are stable. He has completed therapy for his Hepatitis C.   RTC 6 months with repeat labs.  Orders Placed This Encounter  Procedures  . CBC with Differential  . CBC with Differential    Standing Status:   Future    Standing Expiration Date:   01/30/2017    All questions were answered. The patient knows to call the clinic with any problems, questions or concerns. We can certainly see the patient much sooner if necessary.  This document serves as a record of services personally  performed by Ancil Linsey, MD. It was created on her behalf by Arlyce Harman, a trained medical scribe. The creation of this record is based on the scribe's personal observations and the provider's statements to them. This document has been checked and approved by the attending provider.  I have reviewed the above documentation for accuracy and completeness, and I agree with the above. Molli Hazard, MD  02/26/2016 8:11 PM

## 2016-01-31 ENCOUNTER — Encounter (HOSPITAL_COMMUNITY): Payer: Medicare Other

## 2016-01-31 ENCOUNTER — Encounter (HOSPITAL_COMMUNITY): Payer: Self-pay | Admitting: Hematology & Oncology

## 2016-01-31 ENCOUNTER — Encounter (HOSPITAL_COMMUNITY): Payer: Medicare Other | Attending: Hematology & Oncology | Admitting: Hematology & Oncology

## 2016-01-31 VITALS — BP 133/86 | HR 58 | Resp 16 | Ht 72.0 in | Wt 226.3 lb

## 2016-01-31 DIAGNOSIS — D696 Thrombocytopenia, unspecified: Secondary | ICD-10-CM | POA: Diagnosis not present

## 2016-01-31 DIAGNOSIS — R161 Splenomegaly, not elsewhere classified: Secondary | ICD-10-CM | POA: Diagnosis not present

## 2016-01-31 DIAGNOSIS — K7469 Other cirrhosis of liver: Secondary | ICD-10-CM | POA: Diagnosis not present

## 2016-01-31 LAB — CBC WITH DIFFERENTIAL/PLATELET
Basophils Absolute: 0 10*3/uL (ref 0.0–0.1)
Basophils Relative: 1 %
Eosinophils Absolute: 0.2 10*3/uL (ref 0.0–0.7)
Eosinophils Relative: 2 %
HEMATOCRIT: 42.8 % (ref 39.0–52.0)
HEMOGLOBIN: 14.4 g/dL (ref 13.0–17.0)
LYMPHS ABS: 2.8 10*3/uL (ref 0.7–4.0)
LYMPHS PCT: 42 %
MCH: 32.2 pg (ref 26.0–34.0)
MCHC: 33.6 g/dL (ref 30.0–36.0)
MCV: 95.7 fL (ref 78.0–100.0)
MONO ABS: 0.5 10*3/uL (ref 0.1–1.0)
MONOS PCT: 7 %
NEUTROS ABS: 3.3 10*3/uL (ref 1.7–7.7)
NEUTROS PCT: 49 %
Platelets: 71 10*3/uL — ABNORMAL LOW (ref 150–400)
RBC: 4.47 MIL/uL (ref 4.22–5.81)
RDW: 13.2 % (ref 11.5–15.5)
WBC: 6.8 10*3/uL (ref 4.0–10.5)

## 2016-01-31 NOTE — Patient Instructions (Signed)
[facility] Discharge Instructions  RECOMMENDATIONS MADE BY THE CONSULTANT AND ANY TEST RESULTS WILL BE SENT TO YOUR REFERRING PHYSICIAN.  Exam done and seen today by Dr. [facility] today and in 6 months Return to see the doctor in 6 months Please call the clinic if you have any questions or concerns  Thank you for choosing [facility] at [facility] to provide your oncology and hematology care.  To afford each patient quality time with our provider, please arrive at least 15 minutes before your scheduled appointment time.   Beginning January 23rd 2017 lab work for the [facility] will be done in the  Main lab at [facility] on 1st floor. If you have a lab appointment with the Cancer Center please come in thru the  Main Entrance and check in at the main information desk  You need to re-schedule your appointment should you arrive 10 or more minutes late.  We strive to give you quality time with our providers, and arriving late affects you and other patients whose appointments are after yours.  Also, if you no show three or more times for appointments you may be dismissed from the clinic at the providers discretion.     Again, thank you for choosing [facility].  Our hope is that these requests will decrease the amount of time that you wait before being seen by our physicians.       _____________________________________________________________  Should you have questions after your visit to [facility], please contact our office at [phone] between the hours of 8:30 a.m. and 4:30 p.m.  Voicemails left after 4:30 p.m. will not be returned until the following business day.  For prescription refill requests, have your pharmacy contact our office.         Resources For Cancer Patients and their Caregivers ? American Cancer Society: Can assist with transportation, wigs, general needs, runs Look Good  Feel Better.        [phone] [phone] ? Cancer Care: Provides financial assistance, online support groups, medication/co-pay assistance.  1-800-813-HOPE 734-544-5406(4673) ? [facility] Assists [place] cancer patients and their families through emotional , educational and financial support.  (831) 081-5096228-718-4568 ? [place] DSS Where to apply for food stamps, Medicaid and utility assistance. 512-497-4379740-624-7070 ? [facility]: Transportation to medical appointments. 201-827-9570931-704-8066 ? Social Security Administration: May apply for disability if have a Stage IV cancer. 870-0[phone] ? [place] [facility], Disability and Transit Services: Assists with nutrition, care and transit needs. 843-784-8509(913)015-4365  Cancer Center Support Programs: @10RELATIVEDAYS @ > Cancer Support Group  2nd Tuesday of the month 1pm-2pm, Journey Room  > Creative Journey  3rd Tuesday of the month 1130am-1pm, Journey Room  > Look Good Feel Better  1st Wednesday of the month 10am-12 noon, Journey Room (Call American Cancer Society to register [phone])

## 2016-02-04 MED ORDER — PALONOSETRON HCL INJECTION 0.25 MG/5ML
INTRAVENOUS | Status: AC
  Filled 2016-02-04: qty 5

## 2016-02-12 DIAGNOSIS — Z79891 Long term (current) use of opiate analgesic: Secondary | ICD-10-CM | POA: Diagnosis not present

## 2016-02-26 ENCOUNTER — Encounter (HOSPITAL_COMMUNITY): Payer: Self-pay | Admitting: Hematology & Oncology

## 2016-03-18 DIAGNOSIS — E063 Autoimmune thyroiditis: Secondary | ICD-10-CM | POA: Diagnosis not present

## 2016-03-18 DIAGNOSIS — G894 Chronic pain syndrome: Secondary | ICD-10-CM | POA: Diagnosis not present

## 2016-03-18 DIAGNOSIS — E782 Mixed hyperlipidemia: Secondary | ICD-10-CM | POA: Diagnosis not present

## 2016-03-18 DIAGNOSIS — Z1389 Encounter for screening for other disorder: Secondary | ICD-10-CM | POA: Diagnosis not present

## 2016-04-03 DIAGNOSIS — I10 Essential (primary) hypertension: Secondary | ICD-10-CM | POA: Diagnosis not present

## 2016-04-03 DIAGNOSIS — E063 Autoimmune thyroiditis: Secondary | ICD-10-CM | POA: Diagnosis not present

## 2016-04-03 DIAGNOSIS — G894 Chronic pain syndrome: Secondary | ICD-10-CM | POA: Diagnosis not present

## 2016-04-06 ENCOUNTER — Emergency Department (HOSPITAL_COMMUNITY): Payer: Medicare Other

## 2016-04-06 ENCOUNTER — Emergency Department (HOSPITAL_COMMUNITY)
Admission: EM | Admit: 2016-04-06 | Discharge: 2016-04-06 | Disposition: A | Discharge status: Home / Self Care | Payer: Medicare Other | Attending: Emergency Medicine | Admitting: Emergency Medicine

## 2016-04-06 ENCOUNTER — Encounter (HOSPITAL_COMMUNITY): Payer: Self-pay | Admitting: Emergency Medicine

## 2016-04-06 DIAGNOSIS — W010XXA Fall on same level from slipping, tripping and stumbling without subsequent striking against object, initial encounter: Secondary | ICD-10-CM | POA: Diagnosis not present

## 2016-04-06 DIAGNOSIS — G8929 Other chronic pain: Secondary | ICD-10-CM | POA: Diagnosis not present

## 2016-04-06 DIAGNOSIS — Y9289 Other specified places as the place of occurrence of the external cause: Secondary | ICD-10-CM | POA: Insufficient documentation

## 2016-04-06 DIAGNOSIS — Z7982 Long term (current) use of aspirin: Secondary | ICD-10-CM | POA: Diagnosis not present

## 2016-04-06 DIAGNOSIS — F17210 Nicotine dependence, cigarettes, uncomplicated: Secondary | ICD-10-CM | POA: Diagnosis not present

## 2016-04-06 DIAGNOSIS — Y999 Unspecified external cause status: Secondary | ICD-10-CM | POA: Diagnosis not present

## 2016-04-06 DIAGNOSIS — M549 Dorsalgia, unspecified: Secondary | ICD-10-CM

## 2016-04-06 DIAGNOSIS — Y9301 Activity, walking, marching and hiking: Secondary | ICD-10-CM | POA: Diagnosis not present

## 2016-04-06 DIAGNOSIS — M545 Low back pain: Secondary | ICD-10-CM | POA: Insufficient documentation

## 2016-04-06 MED ORDER — CYCLOBENZAPRINE HCL 10 MG PO TABS
10.0000 mg | ORAL_TABLET | Freq: Once | ORAL | Status: AC
  Administered 2016-04-06: 10 mg via ORAL
  Filled 2016-04-06: qty 1

## 2016-04-06 MED ORDER — HYDROMORPHONE HCL 1 MG/ML IJ SOLN
1.0000 mg | Freq: Once | INTRAMUSCULAR | Status: AC
  Administered 2016-04-06: 1 mg via INTRAMUSCULAR
  Filled 2016-04-06: qty 1

## 2016-04-06 MED ORDER — CYCLOBENZAPRINE HCL 10 MG PO TABS: 10.0000 mg | ORAL_TABLET | Freq: Three times a day (TID) | ORAL | 0 refills | Status: DC | PRN

## 2016-04-06 NOTE — ED Triage Notes (Signed)
Pt reports falling from porch in [place] x 2 days ago. C/o lower back pain.

## 2016-04-06 NOTE — Discharge Instructions (Signed)
Alternate ice and heat to your back.  Follow-up with Dr. Sherwood GamblerFusco this week for recheck if not improving

## 2016-04-09 NOTE — ED Provider Notes (Signed)
[facility]-EMER[facility]Y DEPT Provider Note   CSN: [id number] Arrival date & time: 04/06/16  4098     History   Chief Complaint Chief Complaint  Patient presents with  . Back Pain    HPI Jeremy Weeks is a 55 y.o. male.  HPI  Jeremy Weeks is a 55 y.o. male with hx of chronic low back pain and previous back surgery, who presents to the Emergency Department complaining  Of worsening back pain after a mechanical fall.  He states that he slipped while walking on his porch and fell into a bush.  He describes a sharp stabbing pain to his low back that is worse with weight bearing.  Pain has been uncontrolled with his current medications.  He denies head injury, LOC, neck pain, abd pain, urine or bowel changes, numbness or weakness of the LE's.     Past Medical History:  Diagnosis Date  . Back pain   . Hepatitis C   . Thrombocytopenia (HCC) 01/22/2015  . Thyroid disease     Patient Active Problem List   Diagnosis Date Noted  . Hepatitis C 04/05/2015  . Thrombocytopenia (HCC) 01/22/2015    Past Surgical History:  Procedure Laterality Date  . BACK SURGERY    . CHOLECYSTECTOMY         Home Medications    Prior to Admission medications   Medication Sig Start Date End Date Taking? Authorizing Provider  ALPRAZolam Prudy Feeler) 1 MG tablet Take 1 mg by mouth 2 (two) times daily.    Historical Provider, MD  aspirin 81 MG chewable tablet COMPLEMENTARY 01/16/16   Historical Provider, MD  aspirin EC 81 MG tablet Take 81 mg by mouth every morning.    Historical Provider, MD  cyclobenzaprine (FLEXERIL) 10 MG tablet Take 1 tablet (10 mg total) by mouth 3 (three) times daily as needed. 04/06/16   Hunner Garcon, PA-C  levothyroxine (SYNTHROID, LEVOTHROID) 112 MCG tablet Take 112 mcg by mouth daily before breakfast.    Historical Provider, MD  oxyCODONE-acetaminophen (PERCOCET) 10-325 MG per tablet Take 1 tablet by mouth 3 (three) times daily as needed for pain.    Historical Provider, MD    traZODone (DESYREL) 100 MG tablet TAKE ONE TABLET BY MOUTH AT BEDTIME AS NEEDED. 01/16/16   Historical Provider, MD  vitamin E (VITAMIN E) 400 UNIT capsule Take 400 Units by mouth every morning.    Historical Provider, MD    Family History Family History  Problem Relation Age of Onset  . Cancer Mother     Social History Social History  Substance Use Topics  . Smoking status: Current Every Day Smoker    Packs/day: 1.00    Years: 37.00    Types: Cigarettes  . Smokeless tobacco: Never Used  . Alcohol use No     Comment: quit drinking 26 yrs     Allergies   Codeine   Review of Systems Review of Systems  Constitutional: Negative for fever.  Respiratory: Negative for shortness of breath.   Gastrointestinal: Negative for abdominal pain, constipation and vomiting.  Genitourinary: Negative for decreased urine volume, difficulty urinating, dysuria, flank pain and hematuria.  Musculoskeletal: Positive for back pain. Negative for joint swelling.  Skin: Negative for rash.  Neurological: Negative for weakness and numbness.  All other systems reviewed and are negative.    Physical Exam Updated Vital Signs BP (!) 163/105   Pulse 66   Temp 97.9 F (36.6 C)   Resp 18   Ht 6' (1.829  m)   Wt 102.5 kg   SpO2 97%   BMI 30.65 kg/m   Physical Exam  Constitutional: He is oriented to person, place, and time. He appears well-developed and well-nourished. No distress.  HENT:  Head: Normocephalic and atraumatic.  Neck: Normal range of motion. Neck supple.  Cardiovascular: Normal rate, regular rhythm, normal heart sounds and intact distal pulses.   No murmur heard. Pulmonary/Chest: Effort normal and breath sounds normal. No respiratory distress.  Abdominal: Soft. He exhibits no distension. There is no tenderness.  Musculoskeletal: He exhibits tenderness. He exhibits no edema.       Lumbar back: He exhibits tenderness and pain. He exhibits normal range of motion, no swelling, no  deformity, no laceration and normal pulse.  ttp of the lumbar spine and bilateral paraspinal muscles with left > right.  DP pulses are brisk and symmetrical.  Distal sensation intact.  Pt has 5/5 strength against resistance of bilateral lower extremities.     Neurological: He is alert and oriented to person, place, and time. He has normal strength. No sensory deficit. He exhibits normal muscle tone. Coordination and gait normal.  Reflex Scores:      Patellar reflexes are 2+ on the right side and 2+ on the left side.      Achilles reflexes are 2+ on the right side and 2+ on the left side. Skin: Skin is warm and dry. No rash noted.  Nursing note and vitals reviewed.    ED Treatments / Results  Labs (all labs ordered are listed, but only abnormal results are displayed) Labs Reviewed - No data to display  EKG  EKG Interpretation None       Radiology Dg Lumbar Spine Complete  Result Date: 04/06/2016 CLINICAL DATA:  Fall from porch, low back pain EXAM: LUMBAR SPINE - COMPLETE 4+ VIEW COMPARISON:  None. FINDINGS: Five lumbar-type vertebral bodies. Normal lumbar lordosis. Status post PLIF at L4-S1.  No evidence of hardware complication. Mild degenerative changes, most prominent at L1-2 and L5-S1. No evidence of fracture or dislocation. Vertebral body heights are maintained. Visualized bony pelvis appears intact. Cholecystectomy clips. IMPRESSION: Status post PLIF at L4-S1.  No evidence of hardware complication. No fracture or dislocation is seen. Mild degenerative changes. Electronically Signed   By: Charline BillsSriyesh  Krishnan M.D.   On: 04/06/2016 11:19     Procedures Procedures (including critical care time)  Medications Ordered in ED Medications  HYDROmorphone (DILAUDID) injection 1 mg (1 mg Intramuscular Given 04/06/16 1054)  cyclobenzaprine (FLEXERIL) tablet 10 mg (10 mg Oral Given 04/06/16 1054)     Initial Impression / Assessment and Plan / ED Course  I have reviewed the triage vital  signs and the nursing notes.  Pertinent labs & imaging results that were available during my care of the patient were reviewed by me and considered in my medical decision making (see chart for details).  Clinical Course    Pt with likely acute on chronic back pain.  XR negative for fx.  Ambulates in the dept with steady gait.  No focal neurological deficits. No concerning sx's for emergent neurological or infectious process.  Pt agrees to close PMD f/u  Final Clinical Impressions(s) / ED Diagnoses   Final diagnoses:  Chronic back pain    New Prescriptions Discharge Medication List as of 04/06/2016 12:11 PM    START taking these medications   Details  cyclobenzaprine (FLEXERIL) 10 MG tablet Take 1 tablet (10 mg total) by mouth 3 (three) times daily as  needed., Starting Sun 04/06/2016, Print         Myrle Dues, PA-C 04/09/16 2128    Donnetta Hutching, MD 04/14/16 [phone]

## 2016-06-18 DIAGNOSIS — I10 Essential (primary) hypertension: Secondary | ICD-10-CM | POA: Diagnosis not present

## 2016-06-18 DIAGNOSIS — G894 Chronic pain syndrome: Secondary | ICD-10-CM | POA: Diagnosis not present

## 2016-07-18 ENCOUNTER — Encounter (INDEPENDENT_AMBULATORY_CARE_PROVIDER_SITE_OTHER): Payer: Self-pay | Admitting: Internal Medicine

## 2016-07-18 ENCOUNTER — Encounter (INDEPENDENT_AMBULATORY_CARE_PROVIDER_SITE_OTHER): Payer: Self-pay

## 2016-07-26 IMAGING — US US ABDOMEN COMPLETE W/ ELASTOGRAPHY
1 series · 13 of 17 positions shown · non-contrast
Comparison: 02/08/2015 abdominal sonogram.

CLINICAL DATA: Hepatitis-C.



[Series 1: us abdomen complete w/ elastography · 0.15mm/px · 13 of 17 slices shown]
[im 1/17]
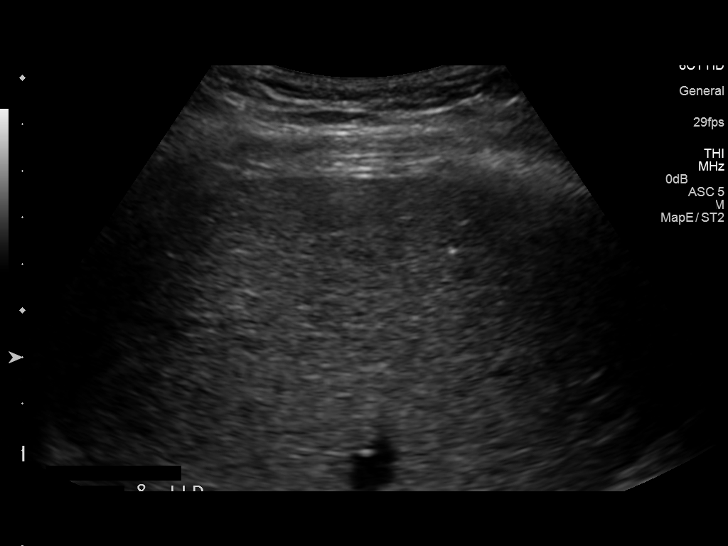
[im 2/17]
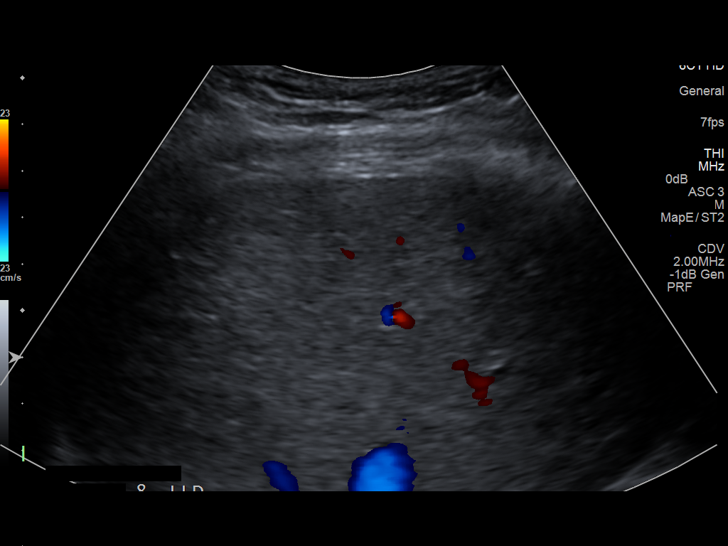
[im 4/17]
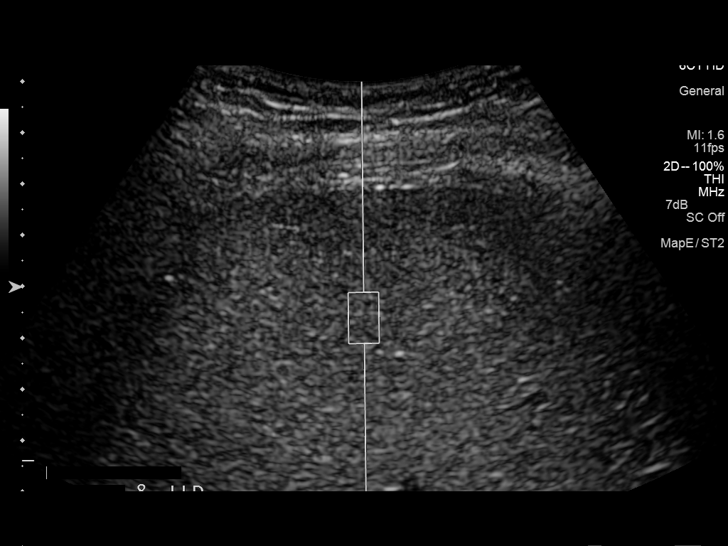
[im 5/17]
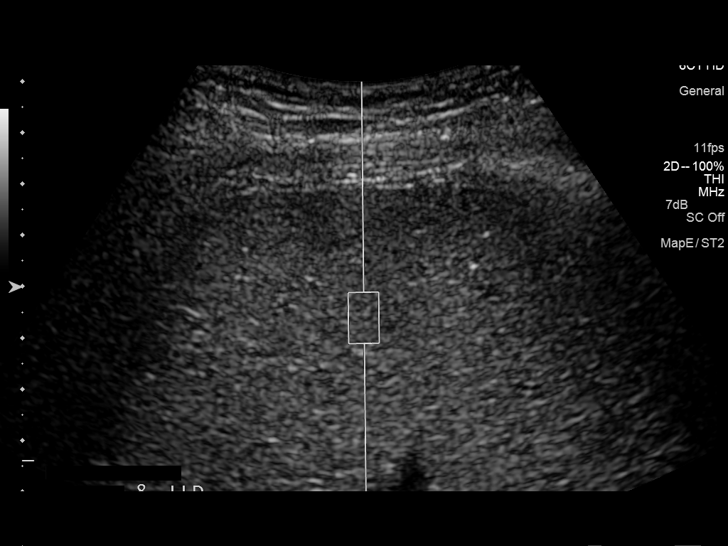
[im 6/17]
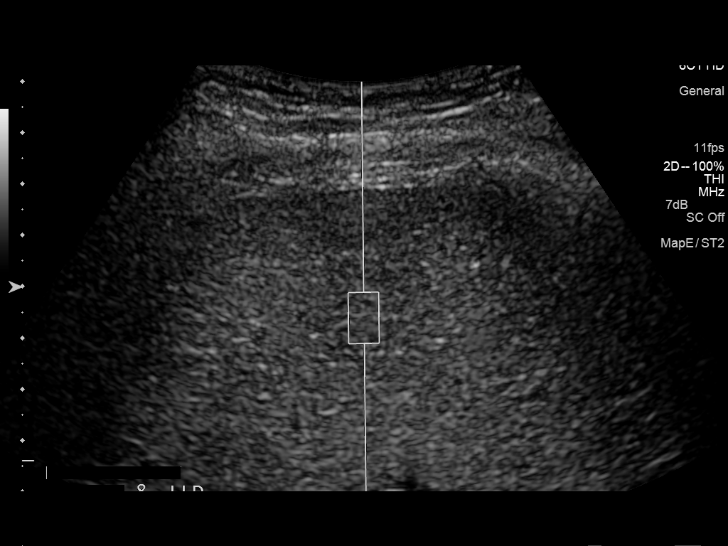
[im 8/17]
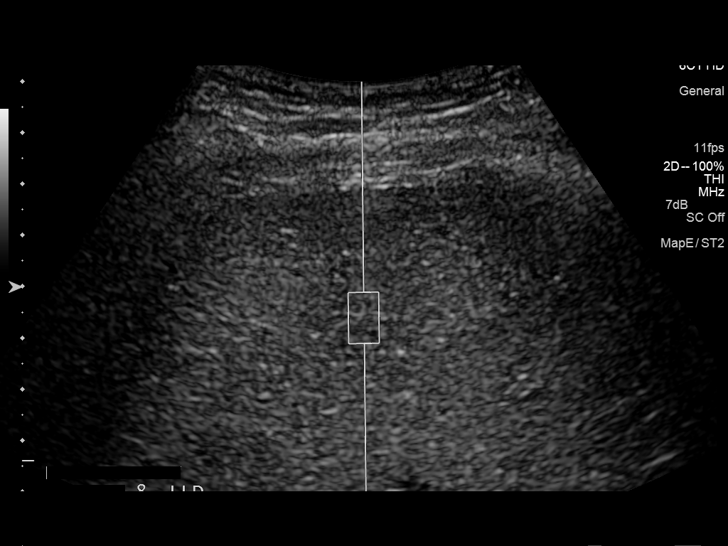
[im 9/17]
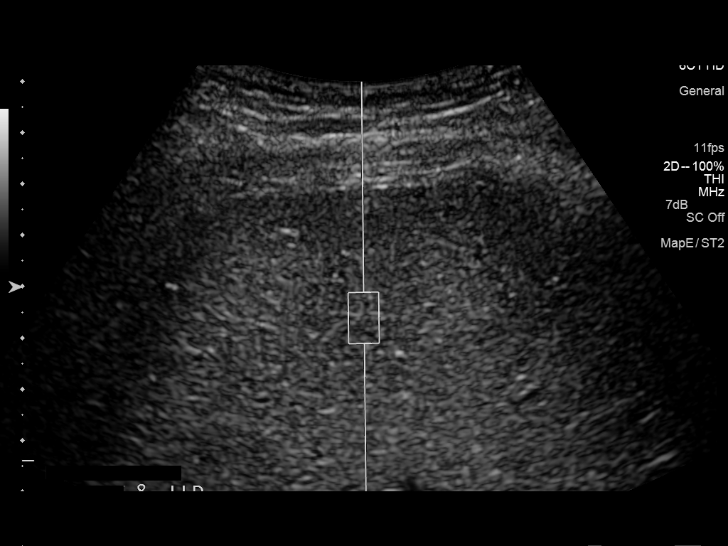
[im 10/17]
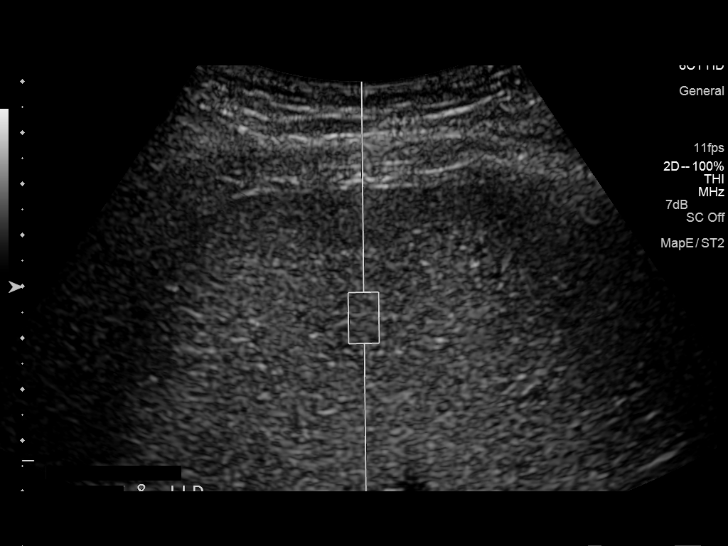
[im 12/17]
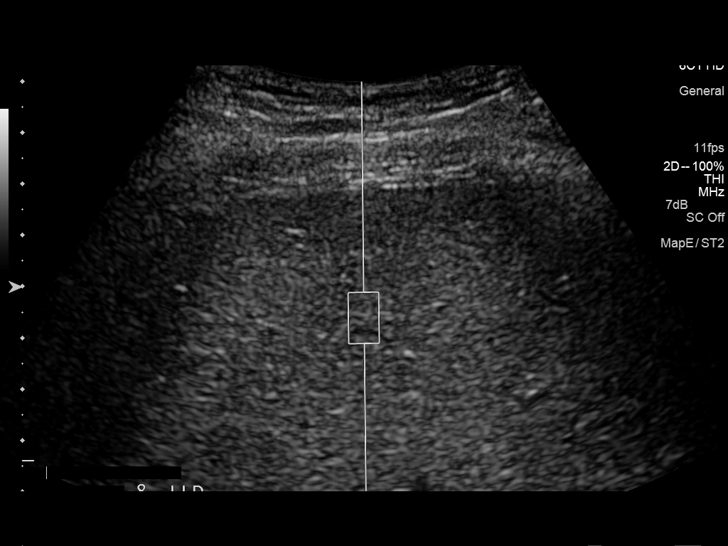
[im 13/17]
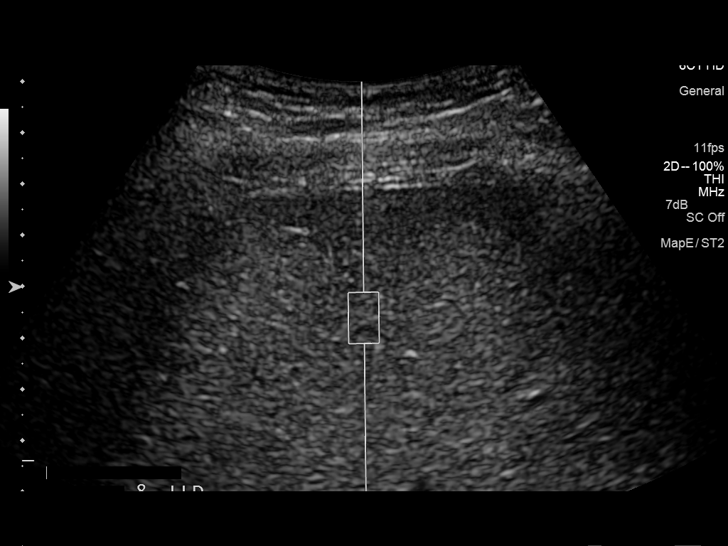
[im 14/17]
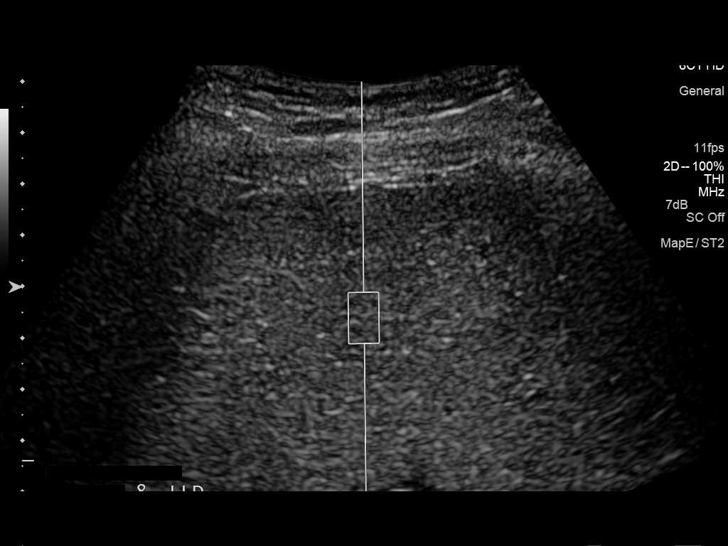
[im 16/17]
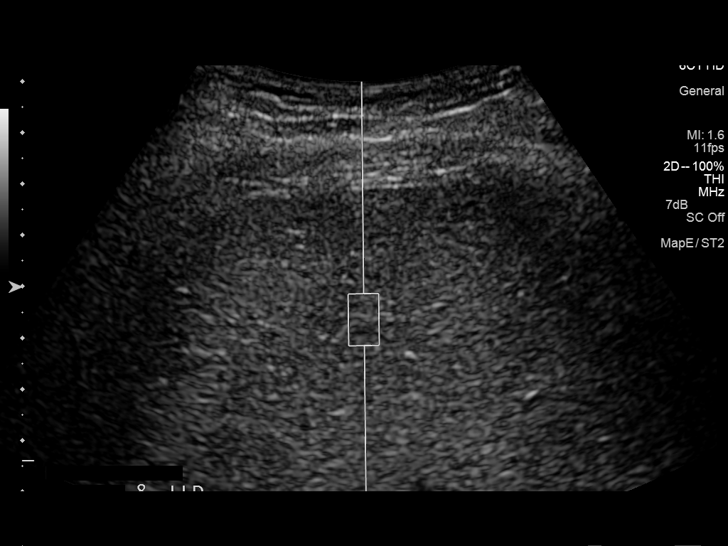
[im 17/17]
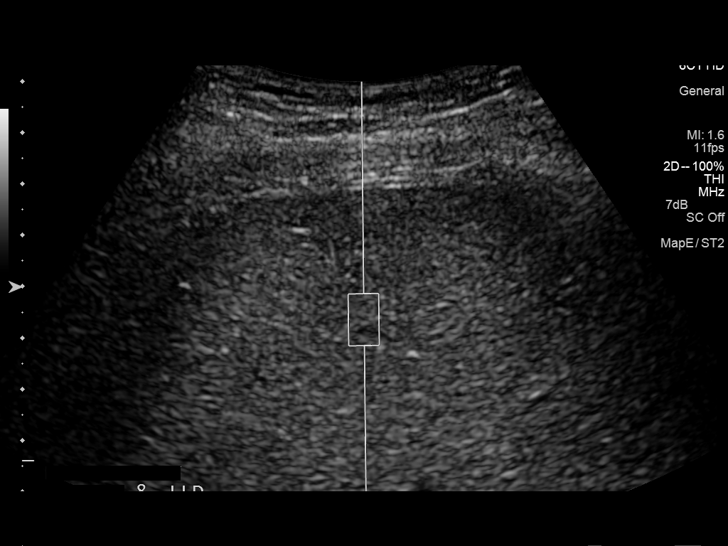

[13 of 17 positions shown; findings below may reference images not displayed]

FINDINGS: ULTRASOUND ABDOMEN

Gallbladder: Status postcholecystectomy.

Common bile duct: Diameter: 8 mm.

Liver: Liver parenchyma is mildly coarsened in echotexture and liver
surface appears finely irregular, suggesting cirrhosis. No liver
mass detected.

IVC: No abnormality visualized.

Pancreas: Visualized portion unremarkable.

Spleen: Top normal size spleen. Craniocaudal splenic length 12.0 cm.
No splenic mass.

Right Kidney: Length: 9.6 cm. Echogenicity within normal limits. No
mass or hydronephrosis visualized.

Left Kidney: Length: 10.2 cm. Echogenicity within normal limits.
Simple 3.2 x 2.7 by 2.7 cm renal cyst in the upper left kidney. No
mass or hydronephrosis visualized.

Abdominal aorta: No aneurysm visualized.

Other findings: None.

ULTRASOUND HEPATIC ELASTOGRAPHY

Device: Siemens Helix VTQ

Transducer 6C1

Patient position: Left lateral decubitus

Number of measurements:  10

Hepatic Segment:  8

Median velocity:   3.1  m/sec

IQR:

IQR/Median velocity ratio

Corresponding Metavir fibrosis score:  Some F3 + F4

Risk of fibrosis: High

Limitations of exam: None

Pertinent findings noted on other imaging exams:  None

Please note that abnormal shear wave velocities may also be
identified in clinical settings other than with hepatic fibrosis,
such as: acute hepatitis, elevated right heart and central venous
pressures including use of beta blockers, Rakesh disease
(Vojicic), infiltrative processes such as
mastocytosis/amyloidosis/infiltrative tumor, extrahepatic
cholestasis, in the post-prandial state, and liver transplantation.
Correlation with patient history, laboratory data, and clinical
condition recommended.
IMPRESSION: 1. Coarsened liver echotexture with mildly irregular liver surface,
suggesting cirrhosis. No liver mass detected.
2. Top-normal size spleen.
3. Status post cholecystectomy. Common bile duct diameter 8 mm,
within expected post cholecystectomy limits.
4. Simple left renal cyst.
5. Hepatic elastography results:
Median hepatic shear wave velocity is calculated at 3.1 m/sec.

Corresponding Metavir fibrosis score is Some F3 + F4.

Risk of fibrosis is high.

Follow-up:  Follow-up advised

## 2016-08-04 ENCOUNTER — Ambulatory Visit (HOSPITAL_COMMUNITY): Payer: Medicare Other | Admitting: Oncology

## 2016-08-04 ENCOUNTER — Other Ambulatory Visit (HOSPITAL_COMMUNITY): Payer: Medicare Other

## 2016-08-08 ENCOUNTER — Ambulatory Visit (HOSPITAL_COMMUNITY): Payer: Medicare Other | Admitting: Oncology

## 2016-08-08 ENCOUNTER — Other Ambulatory Visit (HOSPITAL_COMMUNITY): Payer: Medicare Other

## 2016-08-25 ENCOUNTER — Encounter (HOSPITAL_COMMUNITY): Payer: Medicare Other | Attending: Oncology | Admitting: Adult Health

## 2016-08-25 ENCOUNTER — Encounter (HOSPITAL_COMMUNITY): Payer: Medicare Other

## 2016-08-25 ENCOUNTER — Encounter (HOSPITAL_COMMUNITY): Payer: Self-pay | Admitting: Adult Health

## 2016-08-25 VITALS — BP 181/102 | HR 69 | Temp 97.9°F | Resp 18 | Wt 239.4 lb

## 2016-08-25 DIAGNOSIS — K7469 Other cirrhosis of liver: Secondary | ICD-10-CM

## 2016-08-25 DIAGNOSIS — F419 Anxiety disorder, unspecified: Secondary | ICD-10-CM | POA: Diagnosis not present

## 2016-08-25 DIAGNOSIS — D696 Thrombocytopenia, unspecified: Secondary | ICD-10-CM | POA: Diagnosis not present

## 2016-08-25 DIAGNOSIS — F17200 Nicotine dependence, unspecified, uncomplicated: Secondary | ICD-10-CM

## 2016-08-25 LAB — CBC WITH DIFFERENTIAL/PLATELET
Basophils Absolute: 0 10*3/uL (ref 0.0–0.1)
Basophils Relative: 1 %
EOS ABS: 0.1 10*3/uL (ref 0.0–0.7)
Eosinophils Relative: 2 %
HEMATOCRIT: 42.6 % (ref 39.0–52.0)
HEMOGLOBIN: 14.1 g/dL (ref 13.0–17.0)
LYMPHS ABS: 2.3 10*3/uL (ref 0.7–4.0)
Lymphocytes Relative: 42 %
MCH: 31.5 pg (ref 26.0–34.0)
MCHC: 33.1 g/dL (ref 30.0–36.0)
MCV: 95.3 fL (ref 78.0–100.0)
MONOS PCT: 7 %
Monocytes Absolute: 0.4 10*3/uL (ref 0.1–1.0)
NEUTROS PCT: 48 %
Neutro Abs: 2.6 10*3/uL (ref 1.7–7.7)
Platelets: 83 10*3/uL — ABNORMAL LOW (ref 150–400)
RBC: 4.47 MIL/uL (ref 4.22–5.81)
RDW: 13 % (ref 11.5–15.5)
WBC: 5.4 10*3/uL (ref 4.0–10.5)

## 2016-08-25 NOTE — Patient Instructions (Addendum)
[facility] at [facility] Discharge Instructions  RECOMMENDATIONS MADE BY THE CONSULTANT AND ANY TEST RESULTS WILL BE SENT TO YOUR REFERRING PHYSICIAN.  Exam with Lubertha BasqueGretchen Dawson, NP.   Return to the clinic in 6 months with labs. Please be sure to see your primary care doctor within the next two weeks to discuss your elevated blood pressure.   Please see Angie as you leave today.    Thank you for choosing [facility] at [facility] to provide your oncology and hematology care.  To afford each patient quality time with our provider, please arrive at least 15 minutes before your scheduled appointment time.    If you have a lab appointment with the Cancer Center please come in thru the  Main Entrance and check in at the main information desk  You need to re-schedule your appointment should you arrive 10 or more minutes late.  We strive to give you quality time with our providers, and arriving late affects you and other patients whose appointments are after yours.  Also, if you no show three or more times for appointments you may be dismissed from the clinic at the providers discretion.     Again, thank you for choosing [facility].  Our hope is that these requests will decrease the amount of time that you wait before being seen by our physicians.       _____________________________________________________________  Should you have questions after your visit to [facility], please contact our office at [phone] between the hours of 8:30 a.m. and 4:30 p.m.  Voicemails left after 4:30 p.m. will not be returned until the following business day.  For prescription refill requests, have your pharmacy contact our office.       Resources For Cancer Patients and their Caregivers ? American Cancer Society: Can assist with transportation, wigs, general needs, runs Look Good Feel Better.        [phone] ? Cancer  Care: Provides financial assistance, online support groups, medication/co-pay assistance.  1-800-813-HOPE 734-810-2312(4673) ? [facility] Assists [place] cancer patients and their families through emotional , educational and financial support.  845-293-3824(716) 134-4856 ? [place] DSS Where to apply for food stamps, Medicaid and utility assistance. 604-141-0102(908)860-6586 ? [facility]: Transportation to medical appointments. 386-572-0980(773)039-0950 ? Social Security Administration: May apply for disability if have a Stage IV cancer. [phone] ? [place] [facility], Disability and Transit Services: Assists with nutrition, care and transit needs. 364-839-9218848-700-9629  Cancer Center Support Programs: @10RELATIVEDAYS @ > Cancer Support Group  2nd Tuesday of the month 1pm-2pm, Journey Room  > Creative Journey  3rd Tuesday of the month 1130am-1pm, Journey Room  > Look Good Feel Better  1st Wednesday of the month 10am-12 noon, Journey Room (Call American Cancer Society to register [phone])

## 2016-08-25 NOTE — Progress Notes (Addendum)
[facility] [address], [place] [address]   CLINIC:  Medical Oncology/Hematology  PCP:  Cassell Smiles., MD [address] [place] [address] [phone]   REASON FOR VISIT:  Follow-up for thrombocytopenia  CURRENT THERAPY: Observation    HISTORY OF PRESENT ILLNESS:  (From Dr. Chiquita Loth last note on 01/31/16)     INTERVAL HISTORY:  Ms. Dy reports for follow-up for thrombocytopenia accompanied by his sister.  He reports "feeling weak as water."  He has chronic back pain, which affects his ability to ambulate well.    He sees Dorene Ar, NP/Dr. Karilyn Cota with GI for continued management of his cirrhosis and h/o hepatitis C (treated with Harvoni).  His appetite is good. Denies any frank bleeding from his nose/gums, in his stools, or in his urine.  Denies headache, fever, chills.  Chronic back pain secondary to back surgeries.  He continues to smoke; he is now down to 1/2 ppd; he was previously smoking 3 ppd. He is actively working on quitting smoking.   Endorses chronic history of anxiety; he has been taking 2 tabs Xanax per day, where he takes 1/2 pill about 4 times per day to control his symptoms. He is not on anti-depressant medication.    REVIEW OF SYSTEMS:  Review of Systems  Constitutional: Positive for fatigue. Negative for appetite change, chills and fever.  HENT:  Negative.  Negative for nosebleeds.   Eyes: Negative.   Respiratory: Negative for cough and shortness of breath.   Cardiovascular: Negative for chest pain, leg swelling and palpitations.  Gastrointestinal: Negative.  Negative for abdominal pain, blood in stool, constipation, diarrhea, nausea and vomiting.  Genitourinary: Negative.  Negative for hematuria.   Musculoskeletal: Positive for arthralgias.  Skin: Negative.   Hematological: Negative.  Does not bruise/bleed easily.  Psychiatric/Behavioral: The patient is nervous/anxious (takes Xanax).      PAST  MEDICAL/SURGICAL HISTORY:  Past Medical History:  Diagnosis Date  . Back pain   . Hepatitis C   . Thrombocytopenia (HCC) 01/22/2015  . Thyroid disease    Past Surgical History:  Procedure Laterality Date  . BACK SURGERY    . CHOLECYSTECTOMY       SOCIAL HISTORY:  Social History   Social History  . Marital status: Single    Spouse name: N/A  . Number of children: N/A  . Years of education: N/A   Occupational History  . Not on file.   Social History Main Topics  . Smoking status: Current Every Day Smoker    Packs/day: 1.00    Years: 37.00    Types: Cigarettes  . Smokeless tobacco: Never Used  . Alcohol use No     Comment: quit drinking 26 yrs  . Drug use: No  . Sexual activity: Yes    Birth control/ protection: None   Other Topics Concern  . Not on file   Social History Narrative  . No narrative on file    FAMILY HISTORY:  Family History  Problem Relation Age of Onset  . Cancer Mother     CURRENT MEDICATIONS:  Outpatient Encounter Prescriptions as of 08/25/2016  Medication Sig Note  . ALPRAZolam (XANAX) 1 MG tablet Take 1 mg by mouth 2 (two) times daily.   Marland Kitchen aspirin EC 81 MG tablet Take 81 mg by mouth every morning.   Marland Kitchen levothyroxine (SYNTHROID, LEVOTHROID) 112 MCG tablet Take 112 mcg by mouth daily before breakfast.   . oxyCODONE-acetaminophen (PERCOCET) 10-325 MG per tablet Take 1  tablet by mouth 3 (three) times daily as needed for pain.   . traZODone (DESYREL) 100 MG tablet TAKE ONE TABLET BY MOUTH AT BEDTIME AS NEEDED. 01/31/2016: Received from: External Pharmacy  . vitamin E (VITAMIN E) 400 UNIT capsule Take 400 Units by mouth every morning.   . [DISCONTINUED] aspirin 81 MG chewable tablet COMPLEMENTARY 01/31/2016: Received from: External Pharmacy  . cyclobenzaprine (FLEXERIL) 10 MG tablet Take 1 tablet (10 mg total) by mouth 3 (three) times daily as needed. (Patient not taking: Reported on 08/25/2016)    No facility-administered encounter medications on  file as of 08/25/2016.     ALLERGIES:  Allergies  Allergen Reactions  . Codeine Rash     PHYSICAL EXAM:  ECOG Performance status: 2 - Symptomatic, requires assistance (secondary to chronic back pain/orthopedic surgeries)   Vitals:   08/25/16 1254  BP: (!) 181/102  Pulse: 69  Resp: 18  Temp: 97.9 F (36.6 C)   Filed Weights   08/25/16 1254  Weight: 239 lb 6.4 oz (108.6 kg)    Physical Exam  Constitutional: He is oriented to person, place, and time and well-developed, well-nourished, and in no distress.  HENT:  Head: Normocephalic.  Mouth/Throat: Oropharyngeal exudate (mild posterior orpharyngeal erythema secondary to smoking ) present.  Eyes: EOM are normal. Pupils are equal, round, and reactive to light. No scleral icterus.  Neck: Normal range of motion. Neck supple.  Cardiovascular: Normal rate, regular rhythm and normal heart sounds.   Pulmonary/Chest: Effort normal and breath sounds normal. No respiratory distress.  Abdominal: Soft. Bowel sounds are normal.  Musculoskeletal: Normal range of motion. He exhibits no edema.  Ambulates with cane   Lymphadenopathy:    He has no cervical adenopathy.  Neurological: He is alert and oriented to person, place, and time.  Skin: Skin is warm and dry. No rash noted.  Psychiatric: Mood, memory, affect and judgment normal.     LABORATORY DATA:  I have reviewed the labs as listed.  CBC    Component Value Date/Time   WBC 5.4 08/25/2016 1206   RBC 4.47 08/25/2016 1206   HGB 14.1 08/25/2016 1206   HCT 42.6 08/25/2016 1206   PLT 83 (L) 08/25/2016 1206   MCV 95.3 08/25/2016 1206   MCH 31.5 08/25/2016 1206   MCHC 33.1 08/25/2016 1206   RDW 13.0 08/25/2016 1206   LYMPHSABS 2.3 08/25/2016 1206   MONOABS 0.4 08/25/2016 1206   EOSABS 0.1 08/25/2016 1206   BASOSABS 0.0 08/25/2016 1206     PENDING LABS:    DIAGNOSTIC IMAGING:    PATHOLOGY:     ASSESSMENT & PLAN:   Thrombocytopenia:  -Thrombocytopenia likely  secondary to liver disease.  -Platelet count 83,000 today, up from 71,000 about 6 months ago. -Return to cancer center in 6 months with CBC with diff/CMET.  Maintain follow-up with GI for liver disease, as directed.   Hypertension:  -BP elevated today 160s-180s/100s; asymptomatic.  -Stressed the importance of adequate blood pressure control in the setting of low platelets and brain bleeds, risk for stroke, and heart attack.   -He tells me he is scheduled to see his PCP in 2-3 weeks; I recommended he call their office to try to be seen sooner than 2 weeks to work on controlling his BP.    Anxiety:  -Discussed the use of anti-depressant medications, like Zoloft, in the management of anxiety/panic disorders.  We discussed the mechanism of action and the role long-term anti-depressant medications could have in his care.  I shared with him that I think it would be more appropriate for his PCP to manage his anxiety and depression, since we are only seeing him every 6 months; patient agreed. Encouraged him to talk to his PCP at his next visit to see if Zoloft may be an option for him. He understands that he would likely require less Xanax over time, which he is enthusiastic about.  He will talk with his PCP about this at his upcoming visit.   Tobacco use disorder:  -We discussed the pathophysiology of nicotine dependence and different strategies to stop smoking. The gold standard of tobacco cessation is nicotine replacement (with nicotine patches & gum/lozenges) or Varenicline (Chantix).  We discussed that the typical craving/urge to smoke lasts about 3-5 minutes.  Encouraged him to set some new "rules" that do not allow him to smoke in the house, etc; instead he could use a piece of nicotine gum or a lozenge when he has a craving. I gave him instructions on how to use these nicotine replacement products; 4 mg nicotine gum would likely be a good choice for him.  Mr.Briney  understands that data suggests that  "cold [place]key" is the least effective way to stop using tobacco products.  Chantix could be a feasible choice for him as well.  Having a clear "quit plan" is much more effective and requires a step-wise approach with continued support from a tobacco treatment specialist.  I will defer to his PCP for any medication management given the long intervals between visits here at the cancer cancer, but I did encourage him to reach out to me if he has further questions.  Greater than 15 minutes was spent in smoking cessation counseling with this patient.         Dispo:  -Return to cancer center in 6 months with repeat labs.   All questions were answered to patient's stated satisfaction. Encouraged patient to call with any new concerns or questions before his next visit to the cancer center and we can certain see him sooner, if needed.      Lubertha BasqueGretchen Maresa Morash, NP [facility] [phone]

## 2016-08-25 NOTE — Progress Notes (Signed)
Patient and sister educated on high blood pressure risks.  Sister states that she does home health and knows about high blood pressure.  They are instructed to have him see his PCP within the next two weeks as well as written instruction.  They both verbalized understanding.

## 2016-08-28 ENCOUNTER — Ambulatory Visit (INDEPENDENT_AMBULATORY_CARE_PROVIDER_SITE_OTHER): Payer: Medicare Other | Admitting: Internal Medicine

## 2016-08-28 ENCOUNTER — Encounter (INDEPENDENT_AMBULATORY_CARE_PROVIDER_SITE_OTHER): Payer: Self-pay | Admitting: Internal Medicine

## 2016-08-28 ENCOUNTER — Encounter (INDEPENDENT_AMBULATORY_CARE_PROVIDER_SITE_OTHER): Payer: Self-pay | Admitting: *Deleted

## 2016-08-28 VITALS — BP 170/90 | HR 64 | Temp 97.5°F | Ht 72.0 in | Wt 240.7 lb

## 2016-08-28 DIAGNOSIS — B182 Chronic viral hepatitis C: Secondary | ICD-10-CM | POA: Diagnosis not present

## 2016-08-28 DIAGNOSIS — K7469 Other cirrhosis of liver: Secondary | ICD-10-CM | POA: Diagnosis not present

## 2016-08-28 DIAGNOSIS — K7460 Unspecified cirrhosis of liver: Secondary | ICD-10-CM

## 2016-08-28 HISTORY — DX: Unspecified cirrhosis of liver: K74.60

## 2016-08-28 LAB — HEPATIC FUNCTION PANEL
ALK PHOS: 56 U/L (ref 40–115)
ALT: 10 U/L (ref 9–46)
AST: 18 U/L (ref 10–35)
Albumin: 4 g/dL (ref 3.6–5.1)
BILIRUBIN DIRECT: 0.1 mg/dL (ref <=0.2)
BILIRUBIN INDIRECT: 0.3 mg/dL (ref 0.2–1.2)
Total Bilirubin: 0.4 mg/dL (ref 0.2–1.2)
Total Protein: 7.4 g/dL (ref 6.1–8.1)

## 2016-08-28 NOTE — Patient Instructions (Signed)
Labs today. US abdomen. OV in 6 months.  

## 2016-08-28 NOTE — Progress Notes (Signed)
   Subjective:    Patient ID: Jeremy Weeks, male    DOB: 1961-05-20, 56 y.o.   MRN: [id number]  HPI  HPI Here today for f/u of his His Hepatitis C. Treated with Harvoni x 12 weeks. Started June 01, 2015. He has completed treatment. Hep C quaint undetected at end of treatment.   08/20/2015 Hep C viral load undetected.   Genotype 1B.  05/10/2015 [place] elast F3-F4,   IMPRESSION: 1. Coarsened liver echotexture with mildly irregular liver surface, suggesting cirrhosis. No liver mass detected.      Marland KitchenHx of  Thrombocytopenia, in the setting for normal hemoglobin and WBC. Followed by the [facility] [place] every 6 months.   appetite is good. He has gained 22 pounds. He has a BM usually every day.  No melena or BRRB.  CBC    Component Value Date/Time   WBC 5.4 08/25/2016 1206   RBC 4.47 08/25/2016 1206   HGB 14.1 08/25/2016 1206   HCT 42.6 08/25/2016 1206   PLT 83 (L) 08/25/2016 1206   MCV 95.3 08/25/2016 1206   MCH 31.5 08/25/2016 1206   MCHC 33.1 08/25/2016 1206   RDW 13.0 08/25/2016 1206   LYMPHSABS 2.3 08/25/2016 1206   MONOABS 0.4 08/25/2016 1206   EOSABS 0.1 08/25/2016 1206   BASOSABS 0.0 08/25/2016 1206   Hepatic Function Latest Ref Rng & Units 08/30/2015 08/03/2015 06/25/2015  Total Protein 6.1 - 8.1 g/dL 7.5 7.2 6.9  Albumin 3.6 - 5.1 g/dL 4.1 3.7 4.0  AST 10 - 35 U/L _0 ALT 9 - 46 U/L 9 13(L) 8(L)  Alk Phosphatase 40 - 115 U/L 58 48 43  Total Bilirubin 0.2 - 1.2 mg/dL 0.7 0.6 0.6  Bilirubin, Direct <=0.2 mg/dL 0.2 - 0.2    Review of Systems     Objective:   Physical Exam Blood pressure (!) 170/90, pulse 64, temperature 97.5 F (36.4 C), height 6' (1.829 m), weight 240 lb 11.2 oz (109.2 kg).  Alert and oriented. Skin warm and dry. Oral mucosa is moist.   . Sclera anicteric, conjunctivae is pink. Thyroid not enlarged. No cervical lymphadenopathy. Lungs clear. Heart regular rate and rhythm.  Abdomen is soft. Bowel sounds are positive. No hepatomegaly. No  abdominal masses felt. No tenderness.  No edema to lower extremities.         Assessment & Plan:   Hepatitis C. He is doing well.  Needs US abdomen, Hepatic, andHep C quaint. Recently had at [facility]. OV in 6 months

## 2016-09-04 ENCOUNTER — Ambulatory Visit (HOSPITAL_COMMUNITY)
Admission: RE | Admit: 2016-09-04 | Discharge: 2016-09-04 | Disposition: A | Discharge status: Home / Self Care | Payer: Medicare Other | Source: Ambulatory Visit | Attending: Internal Medicine | Admitting: Internal Medicine

## 2016-09-04 DIAGNOSIS — Z9049 Acquired absence of other specified parts of digestive tract: Secondary | ICD-10-CM | POA: Insufficient documentation

## 2016-09-04 DIAGNOSIS — B182 Chronic viral hepatitis C: Secondary | ICD-10-CM | POA: Diagnosis present

## 2016-09-04 DIAGNOSIS — N281 Cyst of kidney, acquired: Secondary | ICD-10-CM | POA: Diagnosis not present

## 2016-09-04 LAB — HEPATITIS C RNA QUANTITATIVE
HCV Quantitative Log: <1.18 Log IU/mL
HCV Quantitative: <15 IU/mL

## 2016-09-05 DIAGNOSIS — E063 Autoimmune thyroiditis: Secondary | ICD-10-CM | POA: Diagnosis not present

## 2016-09-05 DIAGNOSIS — I10 Essential (primary) hypertension: Secondary | ICD-10-CM | POA: Diagnosis not present

## 2016-09-05 DIAGNOSIS — Z1389 Encounter for screening for other disorder: Secondary | ICD-10-CM | POA: Diagnosis not present

## 2016-09-05 NOTE — Progress Notes (Signed)
Patient was added to the recall list for one year.

## 2016-09-27 ENCOUNTER — Emergency Department (HOSPITAL_COMMUNITY)
Admission: EM | Admit: 2016-09-27 | Discharge: 2016-09-27 | Disposition: A | Discharge status: Home / Self Care | Payer: Medicare Other | Attending: Emergency Medicine | Admitting: Emergency Medicine

## 2016-09-27 ENCOUNTER — Encounter (HOSPITAL_COMMUNITY): Payer: Self-pay | Admitting: Emergency Medicine

## 2016-09-27 DIAGNOSIS — R110 Nausea: Secondary | ICD-10-CM | POA: Diagnosis not present

## 2016-09-27 DIAGNOSIS — R197 Diarrhea, unspecified: Secondary | ICD-10-CM | POA: Diagnosis not present

## 2016-09-27 DIAGNOSIS — Z7982 Long term (current) use of aspirin: Secondary | ICD-10-CM | POA: Diagnosis not present

## 2016-09-27 DIAGNOSIS — R030 Elevated blood-pressure reading, without diagnosis of hypertension: Secondary | ICD-10-CM | POA: Diagnosis not present

## 2016-09-27 DIAGNOSIS — F17210 Nicotine dependence, cigarettes, uncomplicated: Secondary | ICD-10-CM | POA: Insufficient documentation

## 2016-09-27 DIAGNOSIS — R42 Dizziness and giddiness: Secondary | ICD-10-CM | POA: Diagnosis not present

## 2016-09-27 DIAGNOSIS — R202 Paresthesia of skin: Secondary | ICD-10-CM | POA: Diagnosis not present

## 2016-09-27 LAB — COMPREHENSIVE METABOLIC PANEL
ALK PHOS: 53 U/L (ref 38–126)
ALT: 17 U/L (ref 17–63)
AST: 23 U/L (ref 15–41)
Albumin: 4.9 g/dL (ref 3.5–5.0)
Anion gap: 11 (ref 5–15)
BUN: 23 mg/dL — AB (ref 6–20)
CHLORIDE: 101 mmol/L (ref 101–111)
CO2: 23 mmol/L (ref 22–32)
Calcium: 9.9 mg/dL (ref 8.9–10.3)
Creatinine, Ser: 1.08 mg/dL (ref 0.61–1.24)
GFR calc Af Amer: >60 mL/min (ref >60)
GFR calc non Af Amer: >60 mL/min (ref >60)
GLUCOSE: 132 mg/dL — AB (ref 65–99)
POTASSIUM: 3.6 mmol/L (ref 3.5–5.1)
SODIUM: 135 mmol/L (ref 135–145)
Total Bilirubin: 1.3 mg/dL — ABNORMAL HIGH (ref 0.3–1.2)
Total Protein: 8.6 g/dL — ABNORMAL HIGH (ref 6.5–8.1)

## 2016-09-27 LAB — CBC WITH DIFFERENTIAL/PLATELET
Basophils Absolute: 0 10*3/uL (ref 0.0–0.1)
Basophils Relative: 0 %
Eosinophils Absolute: 0 10*3/uL (ref 0.0–0.7)
Eosinophils Relative: 0 %
HCT: 46.2 % (ref 39.0–52.0)
Hemoglobin: 16.1 g/dL (ref 13.0–17.0)
LYMPHS ABS: 2.7 10*3/uL (ref 0.7–4.0)
LYMPHS PCT: 26 %
MCH: 32.2 pg (ref 26.0–34.0)
MCHC: 34.8 g/dL (ref 30.0–36.0)
MCV: 92.4 fL (ref 78.0–100.0)
MONO ABS: 0.5 10*3/uL (ref 0.1–1.0)
MONOS PCT: 5 %
Neutro Abs: 7.3 10*3/uL (ref 1.7–7.7)
Neutrophils Relative %: 69 %
Platelets: 118 10*3/uL — ABNORMAL LOW (ref 150–400)
RBC: 5 MIL/uL (ref 4.22–5.81)
RDW: 13.1 % (ref 11.5–15.5)
WBC: 10.6 10*3/uL — ABNORMAL HIGH (ref 4.0–10.5)

## 2016-09-27 MED ORDER — SODIUM CHLORIDE 0.9 % IV BOLUS (SEPSIS)
1000.0000 mL | Freq: Once | INTRAVENOUS | Status: AC
  Administered 2016-09-27: 1000 mL via INTRAVENOUS

## 2016-09-27 NOTE — ED Provider Notes (Signed)
[facility] DEPT Provider Note   CSN: [id number] Arrival date & time: 09/27/16  1305  By signing my name below, I, Doreatha Martin, attest that this documentation has been prepared under the direction and in the presence of Doug Sou, MD. Electronically Signed: Doreatha Martin, ED Scribe. 09/27/16. 1:28 PM.     History   Chief Complaint Chief Complaint  Patient presents with  . Numbness  . Diarrhea    HPI Jeremy Weeks is a 56 y.o. male who presents to the Emergency Department complaining of intermittent nausea for over a week with associated diarrhea, mild lightheadednessWorse with standing. Pts nausea has currently subsided. He is not currently hungry. Pt reports no diarrhea today, 4-5 episodes of diarrhea yesterday and 6-7 episodes 2 days ago. Pt states he has not been able to tolerate foods with his symptoms, but has been tolerating fluids. No vomiting no abdominal pain Pt states he has taken Immodium with relief of diarrhea. He also reports chronic, intermittent right forearm numbness and tingling to his right face since 2008 after waking from a coma a, lasting ~1 hour at a time with ~4-5 hours in between. He states his numbness has not worsened from baseline with his current symptoms. Pt is a current smoker. He denies alcohol or illegal drug use. He also denies vomiting, fever, additional complaints. denies recent travel or antibiotic use  The history is provided by the patient. No language interpreter was used.    Past Medical History:  Diagnosis Date  . Back pain   . Hepatic cirrhosis (HCC) 08/28/2016  . Hepatitis C   . Thrombocytopenia (HCC) 01/22/2015  . Thyroid disease     Patient Active Problem List   Diagnosis Date Noted  . Hepatic cirrhosis (HCC) 08/28/2016  . Hepatitis C 04/05/2015  . Thrombocytopenia (HCC) 01/22/2015    Past Surgical History:  Procedure Laterality Date  . BACK SURGERY    . CHOLECYSTECTOMY         Home Medications    Prior to  Admission medications   Medication Sig Start Date End Date Taking? Authorizing Provider  ALPRAZolam Prudy Feeler) 1 MG tablet Take 1 mg by mouth 2 (two) times daily.    Historical Provider, MD  aspirin EC 81 MG tablet Take 81 mg by mouth every other day.     Historical Provider, MD  cyclobenzaprine (FLEXERIL) 10 MG tablet Take 1 tablet (10 mg total) by mouth 3 (three) times daily as needed. 04/06/16   Tammy Triplett, PA-C  levothyroxine (SYNTHROID, LEVOTHROID) 112 MCG tablet Take 112 mcg by mouth daily before breakfast.    Historical Provider, MD  Multiple Vitamin (MULTIVITAMIN) tablet Take 1 tablet by mouth daily.    Historical Provider, MD  oxyCODONE-acetaminophen (PERCOCET) 10-325 MG per tablet Take 1 tablet by mouth 3 (three) times daily as needed for pain.    Historical Provider, MD  traZODone (DESYREL) 100 MG tablet TAKE ONE TABLET BY MOUTH AT BEDTIME AS NEEDED. 01/16/16   Historical Provider, MD    Family History Family History  Problem Relation Age of Onset  . Cancer Mother     Social History Social History  Substance Use Topics  . Smoking status: Current Every Day Smoker    Packs/day: 2.00    Years: 37.00    Types: Cigarettes  . Smokeless tobacco: Never Used  . Alcohol use No     Comment: quit drinking 26 yrs     Allergies   Codeine   Review of Systems Review of Systems  Constitutional: Negative.  Negative for fever.  HENT: Negative.   Respiratory: Negative.   Cardiovascular: Negative.   Gastrointestinal: Positive for diarrhea and nausea. Negative for vomiting.  Musculoskeletal: Positive for gait problem.       Walks with cane  Skin: Negative.   Neurological: Positive for light-headedness and numbness (chronic).  Psychiatric/Behavioral: Negative.   All other systems reviewed and are negative.   Physical Exam Updated Vital Signs BP (!) 157/106 (BP Location: Right Arm)   Pulse 89   Temp 98 F (36.7 C) (Oral)   Resp 16   Ht 6' (1.829 m)   Wt 220 lb (99.8 kg)    SpO2 98%   BMI 29.84 kg/m   Physical Exam  Constitutional: He is oriented to person, place, and time. He appears well-developed and well-nourished.  HENT:  Head: Normocephalic and atraumatic.  Eyes: Conjunctivae are normal. Pupils are equal, round, and reactive to light.  Neck: Neck supple. No tracheal deviation present. No thyromegaly present.  Cardiovascular: Normal rate and regular rhythm.   No murmur heard. Pulmonary/Chest: Effort normal and breath sounds normal.  Abdominal: Soft. Bowel sounds are normal. He exhibits no distension. There is no tenderness.  Musculoskeletal: Normal range of motion. He exhibits no edema or tenderness.  Neurological: He is alert and oriented to person, place, and time. Coordination normal.  Walks with cane without difficulty. Finger to nose normal. Motor strength 5 over 5 overall cranial nerves II through XII grossly intact  Skin: Skin is warm and dry. No rash noted.  Psychiatric: He has a normal mood and affect.  Nursing note and vitals reviewed.    ED Treatments / Results   DIAGNOSTIC STUDIES: Oxygen Saturation is 98% on RA, normal by my interpretation.    COORDINATION OF CARE: 1:19 PM Discussed treatment plan with pt at bedside and pt agreed to plan.    Labs (all labs ordered are listed, but only abnormal results are displayed) Labs Reviewed - No data to display  EKG  EKG Interpretation None       Radiology No results found.  Procedures Procedures (including critical care time)  Medications Ordered in ED Medications - No data to display  Results for orders placed or performed during the hospital encounter of 09/27/16  Comprehensive metabolic panel  Result Value Ref Range   Sodium 135 135 - 145 mmol/L   Potassium 3.6 3.5 - 5.1 mmol/L   Chloride 101 101 - 111 mmol/L   CO2 23 22 - 32 mmol/L   Glucose, Bld 132 (H) 65 - 99 mg/dL   BUN 23 (H) 6 - 20 mg/dL   Creatinine, Ser 4.09 0.61 - 1.24 mg/dL   Calcium 9.9 8.9 - 81.1  mg/dL   Total Protein 8.6 (H) 6.5 - 8.1 g/dL   Albumin 4.9 3.5 - 5.0 g/dL   AST 23 15 - 41 U/L   ALT 17 17 - 63 U/L   Alkaline Phosphatase 53 38 - 126 U/L   Total Bilirubin 1.3 (H) 0.3 - 1.2 mg/dL   GFR calc non Af Amer >60 >60 mL/min   GFR calc Af Amer >60 >60 mL/min   Anion gap 11 5 - 15  CBC with Differential/Platelet  Result Value Ref Range   WBC 10.6 (H) 4.0 - 10.5 K/uL   RBC 5.00 4.22 - 5.81 MIL/uL   Hemoglobin 16.1 13.0 - 17.0 g/dL   HCT 91.4 78.2 - 95.6 %   MCV 92.4 78.0 - 100.0 fL   MCH 32.2  26.0 - 34.0 pg   MCHC 34.8 30.0 - 36.0 g/dL   RDW 16.113.1 09.611.5 - 04.515.5 %   Platelets 118 (L) 150 - 400 K/uL   Neutrophils Relative % 69 %   Neutro Abs 7.3 1.7 - 7.7 K/uL   Lymphocytes Relative 26 %   Lymphs Abs 2.7 0.7 - 4.0 K/uL   Monocytes Relative 5 %   Monocytes Absolute 0.5 0.1 - 1.0 K/uL   Eosinophils Relative 0 %   Eosinophils Absolute 0.0 0.0 - 0.7 K/uL   Basophils Relative 0 %   Basophils Absolute 0.0 0.0 - 0.1 K/uL   [place] Abdomen Complete  Result Date: 09/04/2016 CLINICAL DATA:  Hepatitis-C. EXAM: ABDOMEN ULTRASOUND COMPLETE COMPARISON:  No recent prior. FINDINGS: Gallbladder: Cholecystectomy. Common bile duct: Diameter: 5 mm. Liver: Increased echogenicity consistent fatty infiltration and/or hepatocellular disease. No focal hepatic abnormality. IVC: No abnormality visualized. Pancreas: Visualized portion unremarkable. Spleen: Size and appearance within normal limits. Right Kidney: Length: 10.8 cm. Echogenicity within normal limits. No mass or hydronephrosis visualized. Left Kidney: Length: 11.2 cm. Echogenicity within normal limits. No hydronephrosis visualized. 3.2 cm simple cyst. Abdominal aorta: No aneurysm visualized. Other findings: None. IMPRESSION: 1. Cholecystectomy.  No biliary distention. 2. Echogenic liver consistent with fatty infiltration and/or hepatocellular disease. 3.  3.2 cm simple left renal cyst. Electronically Signed   By: Maisie Fushomas  Register   On: 09/04/2016  15:42   Initial Impression / Assessment and Plan / ED Course  I have reviewed the triage vital signs and the nursing notes.  Pertinent labs & imaging results that were available during my care of the patient were reviewed by me and considered in my medical decision making (see chart for details).     After treatment with intravenous fluids patient is alert and ambulatory and feels ready to go home. He is in no distress. Denies nausea. Suspect mild dehydration as cause of diarrhea as patient has had less oral intake Plan follow-up with Dr.Fusco if appetite is not improving next few days. Avoid dairy, encourage oral hydration. Blood pressure recheck. Imodium as needed for diarrhea. Paresthesias which patient describes her chronic and unchanged over many years  Final Clinical Impressions(s) / ED Diagnoses  Diagnosis #1 diarrhea  #2 elevated blood pressure  #3 paresthesias  Final diagnoses:  None    New Prescriptions New Prescriptions   No medications on file    I personally performed the services described in this documentation, which was scribed in my presence. The recorded information has been reviewed and considered.     Doug SouSam Tiarna Koppen, MD 09/27/16 1459

## 2016-09-27 NOTE — ED Triage Notes (Signed)
Pt initially reported that he has been experiencing nausea, diarrhea, and fatigue for more than a week. Pt also reports that when he woke up this morning he had some tingling and numbness to LT side of face and to LT forearm. No weakness, droop, or slurred speech. Pt ambulatory.

## 2016-09-27 NOTE — Discharge Instructions (Signed)
Avoid milk or foods containing milk such as cheese or ice cream all having diarrhea. Take Imodium as directed for diarrhea. Make sure that you drink at least six 8 ounce glasses of water or Gatorade each day in order to stay well-hydrated. Call Dr. Sherwood GamblerFusco within the next 3 weeks. Today's was elevated at 158/98. He should also call him if your appetite is not improved within  the next week

## 2016-10-09 DIAGNOSIS — R5383 Other fatigue: Secondary | ICD-10-CM | POA: Diagnosis not present

## 2016-10-09 DIAGNOSIS — Z1389 Encounter for screening for other disorder: Secondary | ICD-10-CM | POA: Diagnosis not present

## 2016-10-09 DIAGNOSIS — G894 Chronic pain syndrome: Secondary | ICD-10-CM | POA: Diagnosis not present

## 2016-10-09 DIAGNOSIS — E063 Autoimmune thyroiditis: Secondary | ICD-10-CM | POA: Diagnosis not present

## 2016-10-09 DIAGNOSIS — I10 Essential (primary) hypertension: Secondary | ICD-10-CM | POA: Diagnosis not present

## 2016-10-09 DIAGNOSIS — Z125 Encounter for screening for malignant neoplasm of prostate: Secondary | ICD-10-CM | POA: Diagnosis not present

## 2016-11-03 ENCOUNTER — Emergency Department (HOSPITAL_COMMUNITY)
Admission: EM | Admit: 2016-11-03 | Discharge: 2016-11-03 | Disposition: A | Discharge status: Home / Self Care | Payer: Medicare Other | Attending: Emergency Medicine | Admitting: Emergency Medicine

## 2016-11-03 ENCOUNTER — Encounter (HOSPITAL_COMMUNITY): Payer: Self-pay | Admitting: *Deleted

## 2016-11-03 ENCOUNTER — Emergency Department (HOSPITAL_COMMUNITY): Payer: Medicare Other

## 2016-11-03 DIAGNOSIS — Z79899 Other long term (current) drug therapy: Secondary | ICD-10-CM | POA: Diagnosis not present

## 2016-11-03 DIAGNOSIS — F17210 Nicotine dependence, cigarettes, uncomplicated: Secondary | ICD-10-CM | POA: Insufficient documentation

## 2016-11-03 DIAGNOSIS — R5383 Other fatigue: Secondary | ICD-10-CM | POA: Diagnosis not present

## 2016-11-03 DIAGNOSIS — N433 Hydrocele, unspecified: Secondary | ICD-10-CM | POA: Diagnosis not present

## 2016-11-03 DIAGNOSIS — N50811 Right testicular pain: Secondary | ICD-10-CM | POA: Insufficient documentation

## 2016-11-03 DIAGNOSIS — R1032 Left lower quadrant pain: Secondary | ICD-10-CM | POA: Insufficient documentation

## 2016-11-03 DIAGNOSIS — R109 Unspecified abdominal pain: Secondary | ICD-10-CM

## 2016-11-03 DIAGNOSIS — N281 Cyst of kidney, acquired: Secondary | ICD-10-CM | POA: Diagnosis not present

## 2016-11-03 DIAGNOSIS — R404 Transient alteration of awareness: Secondary | ICD-10-CM | POA: Diagnosis not present

## 2016-11-03 DIAGNOSIS — Z7982 Long term (current) use of aspirin: Secondary | ICD-10-CM | POA: Diagnosis not present

## 2016-11-03 DIAGNOSIS — R1031 Right lower quadrant pain: Secondary | ICD-10-CM | POA: Diagnosis not present

## 2016-11-03 DIAGNOSIS — N50819 Testicular pain, unspecified: Secondary | ICD-10-CM

## 2016-11-03 DIAGNOSIS — M546 Pain in thoracic spine: Secondary | ICD-10-CM | POA: Diagnosis not present

## 2016-11-03 DIAGNOSIS — R531 Weakness: Secondary | ICD-10-CM | POA: Diagnosis not present

## 2016-11-03 LAB — URINALYSIS, ROUTINE W REFLEX MICROSCOPIC
Bilirubin Urine: NEGATIVE
Glucose, UA: NEGATIVE mg/dL
HGB URINE DIPSTICK: NEGATIVE
Ketones, ur: NEGATIVE mg/dL
LEUKOCYTES UA: NEGATIVE
NITRITE: NEGATIVE
PROTEIN: NEGATIVE mg/dL
SPECIFIC GRAVITY, URINE: 1.019 (ref 1.005–1.030)
pH: 6 (ref 5.0–8.0)

## 2016-11-03 LAB — COMPREHENSIVE METABOLIC PANEL
ALBUMIN: 3.7 g/dL (ref 3.5–5.0)
ALT: 14 U/L — ABNORMAL LOW (ref 17–63)
ANION GAP: 9 (ref 5–15)
AST: 16 U/L (ref 15–41)
Alkaline Phosphatase: 48 U/L (ref 38–126)
BILIRUBIN TOTAL: 0.9 mg/dL (ref 0.3–1.2)
BUN: 15 mg/dL (ref 6–20)
CO2: 25 mmol/L (ref 22–32)
Calcium: 8.9 mg/dL (ref 8.9–10.3)
Chloride: 104 mmol/L (ref 101–111)
Creatinine, Ser: 0.89 mg/dL (ref 0.61–1.24)
GFR calc Af Amer: >60 mL/min (ref >60)
GFR calc non Af Amer: >60 mL/min (ref >60)
Glucose, Bld: 102 mg/dL — ABNORMAL HIGH (ref 65–99)
POTASSIUM: 3.3 mmol/L — AB (ref 3.5–5.1)
SODIUM: 138 mmol/L (ref 135–145)
TOTAL PROTEIN: 6.7 g/dL (ref 6.5–8.1)

## 2016-11-03 LAB — CBC WITH DIFFERENTIAL/PLATELET
Basophils Absolute: 0 10*3/uL (ref 0.0–0.1)
Basophils Relative: 0 %
EOS ABS: 0 10*3/uL (ref 0.0–0.7)
EOS PCT: 0 %
HCT: 44.5 % (ref 39.0–52.0)
Hemoglobin: 14.9 g/dL (ref 13.0–17.0)
Lymphocytes Relative: 34 %
Lymphs Abs: 2.8 10*3/uL (ref 0.7–4.0)
MCH: 31.3 pg (ref 26.0–34.0)
MCHC: 33.5 g/dL (ref 30.0–36.0)
MCV: 93.5 fL (ref 78.0–100.0)
MONO ABS: 0.6 10*3/uL (ref 0.1–1.0)
MONOS PCT: 7 %
Neutro Abs: 5 10*3/uL (ref 1.7–7.7)
Neutrophils Relative %: 59 %
PLATELETS: 127 10*3/uL — AB (ref 150–400)
RBC: 4.76 MIL/uL (ref 4.22–5.81)
RDW: 13.1 % (ref 11.5–15.5)
WBC: 8.4 10*3/uL (ref 4.0–10.5)

## 2016-11-03 LAB — LIPASE, BLOOD: Lipase: 21 U/L (ref 11–51)

## 2016-11-03 LAB — POC OCCULT BLOOD, ED: Fecal Occult Bld: NEGATIVE

## 2016-11-03 MED ORDER — IOPAMIDOL (ISOVUE-300) INJECTION 61%
100.0000 mL | Freq: Once | INTRAVENOUS | Status: AC | PRN
  Administered 2016-11-03: 100 mL via INTRAVENOUS

## 2016-11-03 MED ORDER — SODIUM CHLORIDE 0.9 % IV BOLUS (SEPSIS)
1000.0000 mL | Freq: Once | INTRAVENOUS | Status: AC
  Administered 2016-11-03: 1000 mL via INTRAVENOUS

## 2016-11-03 MED ORDER — MORPHINE SULFATE (PF) 4 MG/ML IV SOLN
6.0000 mg | Freq: Once | INTRAVENOUS | Status: AC
Start: 2016-11-03 — End: 2016-11-03
  Administered 2016-11-03: 6 mg via INTRAVENOUS
  Filled 2016-11-03: qty 2

## 2016-11-03 MED ORDER — ALPRAZOLAM 0.5 MG PO TABS
1.0000 mg | ORAL_TABLET | Freq: Once | ORAL | Status: AC
  Administered 2016-11-03: 1 mg via ORAL
  Filled 2016-11-03: qty 2

## 2016-11-03 MED ORDER — ONDANSETRON HCL 4 MG/2ML IJ SOLN
4.0000 mg | Freq: Once | INTRAMUSCULAR | Status: AC
  Administered 2016-11-03: 4 mg via INTRAVENOUS
  Filled 2016-11-03: qty 2

## 2016-11-03 MED ORDER — MORPHINE SULFATE (PF) 4 MG/ML IV SOLN
6.0000 mg | Freq: Once | INTRAVENOUS | Status: AC
  Administered 2016-11-03: 6 mg via INTRAVENOUS
  Filled 2016-11-03: qty 2

## 2016-11-03 NOTE — ED Notes (Signed)
ED Provider at bedside. 

## 2016-11-03 NOTE — Care Management Note (Addendum)
Case Management Note  Patient Details  Name: Jeremy Weeks MRN: [id number] Date of Birth: 06-26-1961  Subjective/Objective:  Patient seen in ER. Presents with multiple complaints. PT and SW ordered for fatigue/weakness and possible SNF placement in the future. Offered choice of HH agencies.                   Action/Plan: Alroy Bailiff of [facility] notified and will obtain orders from chart. Patient aware that [facility] has 48 hours to initiate services.    Expected Discharge Date:      11/03/2016            Expected Discharge Plan:  Home w Home Health Services  In-House Referral:     Discharge planning Services  CM Consult  Post Acute Care Choice:    Choice offered to:  Patient  DME Arranged:    DME Agency:     HH Arranged:  PT, Social Work [employer] Agency:  [facility]  Status of Service:  Completed, signed off  If discussed at [employer], dates discussed:    Additional Comments:  Gracy Ehly, Chrystine Oiler, RN 11/03/2016, 3:06 PM

## 2016-11-03 NOTE — ED Notes (Signed)
Awaiting social services to come speak with pt

## 2016-11-03 NOTE — ED Provider Notes (Signed)
[facility] Provider Note   CSN: [id number] Arrival date & time: 11/03/16  4098   By signing my name below, I, Jeremy Weeks, attest that this documentation has been prepared under the direction and in the presence of Jeremy Memos, MD . Electronically Signed: Freida Weeks, Scribe. 11/03/2016. 8:26 AM.   History   Chief Complaint Chief Complaint  Patient presents with  . Flank Pain   The history is provided by the patient. No language interpreter was used.    HPI Comments:  Jeremy Weeks is a 56 y.o. male with a history of cholecystectomy, hepatitis C, and chronic back pain, who presents to the Emergency Department complaining of sharp, constant, 10/10 bilateral flank pain, worsening in the last 24 hours.his pain is worse when with eating and ambulation. Pt reports associated difficulty urinating, decreased appetite, dysuria, fatigue, bilious emesis, lower abdominal pain, right testicular pain, dark, tarry stool, dizziness and weight loss.  He states he has felt like this for about four years. Pt last ate yesterday; last BM was yesterday as well. Pt smokes ~1 ppd. No alleviating factors noted.    Past Medical History:  Diagnosis Date  . Back pain   . Hepatic cirrhosis (HCC) 08/28/2016  . Hepatitis C   . Thrombocytopenia (HCC) 01/22/2015  . Thyroid disease     Patient Active Problem List   Diagnosis Date Noted  . Hepatic cirrhosis (HCC) 08/28/2016  . Hepatitis C 04/05/2015  . Thrombocytopenia (HCC) 01/22/2015    Past Surgical History:  Procedure Laterality Date  . BACK SURGERY    . CHOLECYSTECTOMY         Home Medications    Prior to Admission medications   Medication Sig Start Date End Date Taking? Authorizing Provider  ALPRAZolam Prudy Feeler) 1 MG tablet Take 1 mg by mouth 3 (three) times daily.    Yes Historical Provider, MD  aspirin EC 81 MG tablet Take 81 mg by mouth daily.    Yes Historical Provider, MD  levothyroxine (SYNTHROID, LEVOTHROID) 150 MCG  tablet Take 150 mcg by mouth daily before breakfast.   Yes Historical Provider, MD  Multiple Vitamin (MULTIVITAMIN) tablet Take 1 tablet by mouth daily.   Yes Historical Provider, MD  oxyCODONE-acetaminophen (PERCOCET) 10-325 MG per tablet Take 1 tablet by mouth every 4 (four) hours as needed for pain.    Yes Historical Provider, MD  traZODone (DESYREL) 100 MG tablet TAKE ONE TABLET BY MOUTH AT BEDTIME AS NEEDED. 01/16/16  Yes Historical Provider, MD    Family History Family History  Problem Relation Age of Onset  . Cancer Mother     Social History Social History  Substance Use Topics  . Smoking status: Current Every Day Smoker    Packs/day: 2.00    Years: 37.00    Types: Cigarettes  . Smokeless tobacco: Never Used  . Alcohol use No     Comment: quit drinking 26 yrs     Allergies   Codeine   Review of Systems Review of Systems  Constitutional: Positive for appetite change, fatigue and unexpected weight change. Negative for chills and fever.  Respiratory: Negative for shortness of breath.   Cardiovascular: Negative for chest pain.  Gastrointestinal: Positive for abdominal pain and vomiting.  Genitourinary: Positive for difficulty urinating, flank pain and testicular pain.  Skin: Negative for rash.  Neurological: Positive for dizziness.  All other systems reviewed and are negative.    Physical Exam Updated Vital Signs BP 137/84   Pulse (!) 49  Temp 98.4 F (36.9 C) (Oral)   Resp 18   Ht 6' (1.829 m)   Wt 220 lb (99.8 kg)   SpO2 93%   BMI 29.84 kg/m   Physical Exam  Constitutional: He is oriented to person, place, and time. He appears well-developed and well-nourished. No distress.  HENT:  Head: Normocephalic and atraumatic.  Eyes: Conjunctivae and EOM are normal. Pupils are equal, round, and reactive to light.  Cardiovascular: Normal rate, regular rhythm, normal heart sounds and intact distal pulses.  Exam reveals no gallop and no friction rub.   No murmur  heard. Pulmonary/Chest: Effort normal and breath sounds normal. No respiratory distress. He has no wheezes. He has no rales. He exhibits no tenderness.  Abdominal: Soft. Bowel sounds are normal. He exhibits no distension and no mass. There is no tenderness (no abdominal tenderness). There is CVA tenderness (bilateral). There is no rebound and no guarding. No hernia.  Genitourinary:  Genitourinary Comments: Prostate tenderness No hypertrophy or nodules  Brown stool noted; no obvious hemorrhoids/fissures.   Neurological: He is alert and oriented to person, place, and time.  Skin: Skin is warm and dry.  Nursing note and vitals reviewed.    ED Treatments / Results  DIAGNOSTIC STUDIES:  Oxygen Saturation is 98% on RA, normal by my interpretation.    COORDINATION OF CARE:  8:26 AM Discussed treatment plan with pt at bedside and pt agreed to plan.  Labs (all labs ordered are listed, but only abnormal results are displayed) Labs Reviewed  CBC WITH DIFFERENTIAL/PLATELET - Abnormal; Notable for the following:       Result Value   Platelets 127 (*)    All other components within normal limits  COMPREHENSIVE METABOLIC PANEL - Abnormal; Notable for the following:    Potassium 3.3 (*)    Glucose, Bld 102 (*)    ALT 14 (*)    All other components within normal limits  URINALYSIS, ROUTINE W REFLEX MICROSCOPIC  LIPASE, BLOOD  POC OCCULT BLOOD, ED    EKG  EKG Interpretation None       Radiology US Scrotum  Result Date: 11/03/2016 CLINICAL DATA:  Right testicular pain EXAM: SCROTAL ULTRASOUND DOPPLER ULTRASOUND OF THE TESTICLES TECHNIQUE: Complete ultrasound examination of the testicles, epididymis, and other scrotal structures was performed. Color and spectral Doppler ultrasound were also utilized to evaluate blood flow to the testicles. COMPARISON:  09/04/2016. FINDINGS: Right testicle Measurements: 4.1 x 2.4 x 3.3 cm. No mass or microlithiasis visualized. Left testicle Measurements:  4.3 x 1.8 x 3.4 cm. No mass or microlithiasis visualized. Right epididymis:  Normal in size and appearance. Left epididymis:  Normal in size and appearance. Hydrocele:  Tiny bilateral hydroceles. Varicocele:  None visualized. Pulsed Doppler interrogation of both testes demonstrates normal low resistance arterial and venous waveforms bilaterally. IMPRESSION: Tiny bilateral hydroceles otherwise negative exam. No evidence of testicular mass or torsion . Electronically Signed   By: Maisie Fus  Register   On: 11/03/2016 11:11   Ct Abdomen Pelvis W Contrast  Result Date: 11/03/2016 CLINICAL DATA:  56 year old male with bilateral flank pain for 1 week, Dysuria, worsening nausea. EXAM: CT ABDOMEN AND PELVIS WITH CONTRAST TECHNIQUE: Multidetector CT imaging of the abdomen and pelvis was performed using the standard protocol following bolus administration of intravenous contrast. CONTRAST:  ISOVUE-300 IOPAMIDOL (ISOVUE-300) INJECTION 61% COMPARISON:  CT Abdomen and Pelvis without contrast fourth 11/30/2009 FINDINGS: Lower chest: Lower lung volumes. Mild dependent atelectasis in the lower lobes. No pericardial or pleural effusion.  Hepatobiliary: Surgically absent gallbladder. Chronic intra and extrahepatic biliary ductal prominence appears not significantly changed. Liver enhancement otherwise within normal limits. Pancreas: Negative. Spleen: Negative. Adrenals/Urinary Tract: Normal adrenal glands. There is symmetric appearing bilateral renal enhancement and contrast excretion from the kidneys. However, there is increased perinephric stranding or pararenal edema bilaterally compared to 2011 (series 2, image 36). No urothelial enhancement identified. Renal cortical enhancement is within normal limits. Exophytic left upper pole renal cyst with simple fluid density, and small are simple fluid density left lower pole cyst incidentally noted. No hydroureter. No urologic calculus identified. Unremarkable urinary bladder.  Stomach/Bowel: Negative rectum. Redundant sigmoid colon extending into the right lower quadrants. Occasional sigmoid diverticulae a, no active inflammation. Negative left colon. Negative transverse colon. Negative right colon and appendix. Negative terminal ileum. No dilated small bowel. Decompressed stomach. Negative duodenum. No abdominal free fluid. Vascular/Lymphatic: Aortoiliac calcified atherosclerosis. Major arterial structures in the abdomen and pelvis remain patent. Portal venous system appears patent. Reproductive: Negative. Other: No pelvic free fluid. Musculoskeletal: lower lumbar posterior and interbody fusion changes since 2011. Solid-appearing posterior element fusion at L4-L5 especially on the left. Mild interbody implant subsidence at both levels. Evidence of suboptimal arthrodesis at the lumbosacral junction and loosening of the right S1 pedicle screw (sagittal image 53). No acute osseous abnormality identified. IMPRESSION: 1. Increased but nonspecific bilateral perinephric stranding/edema compared to a 2011 CT Abdomen and Pelvis. Renal enhancement and contrast excretion appears normal such that there is no evidence of obstructive uropathy or pyelonephritis. Query acute intrinsic renal disease (e.g. glomerulonephritis, etc). 2. Otherwise no acute or inflammatory process identified in the abdomen or pelvis. Normal appendix. 3. Mild Calcified aortic atherosclerosis. 4. Previous L4-L5 and L5-S1 fusion with evidence of right S1 pedicle screw loosening and absent or suboptimal arthrodesis at L5-S1. Electronically Signed   By: Odessa Fleming M.D.   On: 11/03/2016 13:48   US Renal  Result Date: 11/03/2016 CLINICAL DATA:  New BILATERAL flank pain for 1 week with difficulty urinating. EXAM: RENAL / URINARY TRACT ULTRASOUND COMPLETE COMPARISON:  09/04/2016. FINDINGS: Right Kidney: Length: 11.5 cm. Echogenicity within normal limits. No mass or hydronephrosis visualized. Left Kidney: Length: 11.8 cm. Echogenicity  within normal limits. No mass or hydronephrosis visualized. Parapelvic cyst identified, similar to priors. Bladder: Appears normal for degree of bladder distention. IMPRESSION: No acute findings. No solid mass or hydronephrosis. Similar appearance to priors. Electronically Signed   By: Elsie Stain M.D.   On: 11/03/2016 10:59   [place] Art/ven Flow Abd Pelv Doppler  Result Date: 11/03/2016 CLINICAL DATA:  Right testicular pain EXAM: SCROTAL ULTRASOUND DOPPLER ULTRASOUND OF THE TESTICLES TECHNIQUE: Complete ultrasound examination of the testicles, epididymis, and other scrotal structures was performed. Color and spectral Doppler ultrasound were also utilized to evaluate blood flow to the testicles. COMPARISON:  09/04/2016. FINDINGS: Right testicle Measurements: 4.1 x 2.4 x 3.3 cm. No mass or microlithiasis visualized. Left testicle Measurements: 4.3 x 1.8 x 3.4 cm. No mass or microlithiasis visualized. Right epididymis:  Normal in size and appearance. Left epididymis:  Normal in size and appearance. Hydrocele:  Tiny bilateral hydroceles. Varicocele:  None visualized. Pulsed Doppler interrogation of both testes demonstrates normal low resistance arterial and venous waveforms bilaterally. IMPRESSION: Tiny bilateral hydroceles otherwise negative exam. No evidence of testicular mass or torsion . Electronically Signed   By: Maisie Fus  Register   On: 11/03/2016 11:11    Procedures Procedures (including critical care time)  Medications Ordered in ED Medications  sodium chloride 0.9 % bolus  1,000 mL (0 mLs Intravenous Stopped 11/03/16 1141)  ondansetron (ZOFRAN) injection 4 mg (4 mg Intravenous Given 11/03/16 0826)  morphine 4 MG/ML injection 6 mg (6 mg Intravenous Given 11/03/16 0857)  morphine 4 MG/ML injection 6 mg (6 mg Intravenous Given 11/03/16 1208)  ALPRAZolam (XANAX) tablet 1 mg (1 mg Oral Given 11/03/16 1208)  iopamidol (ISOVUE-300) 61 % injection 100 mL (100 mLs Intravenous Contrast Given 11/03/16 1317)      Initial Impression / Assessment and Plan / ED Course  I have reviewed the triage vital signs and the nursing notes.  Pertinent labs & imaging results that were available during my care of the patient were reviewed by me and considered in my medical decision making (see chart for details).     Multiple complaints but all seem to revolve around deconditioning and fatigue for which no acute causes found here.  Will need to continue following up with PCP for his preference of nursing home placement. PT/SW ordered for at home care as well. otherwise no acute needs identified here.   Final Clinical Impressions(s) / ED Diagnoses   Final diagnoses:  Testicle pain  Flank pain  Other fatigue    New Prescriptions New Prescriptions   No medications on file   I personally performed the services described in this documentation, which was scribed in my presence. The recorded information has been reviewed and is accurate.     Jeremy Memos, MD 11/03/16 1420

## 2016-11-03 NOTE — ED Notes (Signed)
Lab at bedside

## 2016-11-03 NOTE — ED Triage Notes (Signed)
Pt reports bilateral flank pain x 1 week with dysuria. Pt reports taking a percocet 10-325 about 5:30 am with no relief. Per [facility], pt is seeking long term placement in a nursing home.

## 2016-11-07 DIAGNOSIS — G894 Chronic pain syndrome: Secondary | ICD-10-CM | POA: Diagnosis not present

## 2016-11-07 DIAGNOSIS — E063 Autoimmune thyroiditis: Secondary | ICD-10-CM | POA: Diagnosis not present

## 2016-11-07 DIAGNOSIS — M1991 Primary osteoarthritis, unspecified site: Secondary | ICD-10-CM | POA: Diagnosis not present

## 2016-11-07 DIAGNOSIS — E039 Hypothyroidism, unspecified: Secondary | ICD-10-CM | POA: Diagnosis not present

## 2016-11-26 DIAGNOSIS — G894 Chronic pain syndrome: Secondary | ICD-10-CM | POA: Diagnosis not present

## 2016-11-26 DIAGNOSIS — E063 Autoimmune thyroiditis: Secondary | ICD-10-CM | POA: Diagnosis not present

## 2016-11-26 DIAGNOSIS — E039 Hypothyroidism, unspecified: Secondary | ICD-10-CM | POA: Diagnosis not present

## 2016-11-26 DIAGNOSIS — I10 Essential (primary) hypertension: Secondary | ICD-10-CM | POA: Diagnosis not present

## 2016-11-27 DIAGNOSIS — Z1211 Encounter for screening for malignant neoplasm of colon: Secondary | ICD-10-CM | POA: Diagnosis not present

## 2017-01-26 DIAGNOSIS — M1991 Primary osteoarthritis, unspecified site: Secondary | ICD-10-CM | POA: Diagnosis not present

## 2017-01-26 DIAGNOSIS — G894 Chronic pain syndrome: Secondary | ICD-10-CM | POA: Diagnosis not present

## 2017-01-26 DIAGNOSIS — Z1389 Encounter for screening for other disorder: Secondary | ICD-10-CM | POA: Diagnosis not present

## 2017-02-09 ENCOUNTER — Encounter (INDEPENDENT_AMBULATORY_CARE_PROVIDER_SITE_OTHER): Payer: Self-pay | Admitting: *Deleted

## 2017-02-25 ENCOUNTER — Ambulatory Visit (INDEPENDENT_AMBULATORY_CARE_PROVIDER_SITE_OTHER): Payer: Medicare Other | Admitting: Internal Medicine

## 2017-02-25 ENCOUNTER — Encounter (INDEPENDENT_AMBULATORY_CARE_PROVIDER_SITE_OTHER): Payer: Self-pay | Admitting: Internal Medicine

## 2017-02-25 ENCOUNTER — Encounter (INDEPENDENT_AMBULATORY_CARE_PROVIDER_SITE_OTHER): Payer: Self-pay

## 2017-02-25 VITALS — BP 154/80 | HR 68 | Temp 97.4°F | Ht 72.0 in | Wt 220.0 lb

## 2017-02-25 DIAGNOSIS — B182 Chronic viral hepatitis C: Secondary | ICD-10-CM | POA: Diagnosis not present

## 2017-02-25 NOTE — Progress Notes (Signed)
Subjective:    Patient ID: Jeremy Weeks, male    DOB: [birth year], 56 y.o.   MRN: [id number]  HPI Here today for f/u. Hx of Hepatitis C and successfully treated in 2016 with Harvoni x 12 weeks. Doing good. Appetite is good. He has lost 20 pounds which was intentional. BMs ar normal. No melena or BRRB He is trying to exercising by walking.     CBC    Component Value Date/Time   WBC 8.4 11/03/2016 0902   RBC 4.76 11/03/2016 0902   HGB 14.9 11/03/2016 0902   HCT 44.5 11/03/2016 0902   PLT 127 (L) 11/03/2016 0902   MCV 93.5 11/03/2016 0902   MCH 31.3 11/03/2016 0902   MCHC 33.5 11/03/2016 0902   RDW 13.1 11/03/2016 0902   LYMPHSABS 2.8 11/03/2016 0902   MONOABS 0.6 11/03/2016 0902   EOSABS 0.0 11/03/2016 0902   BASOSABS 0.0 11/03/2016 0902     08/28/2016 Hep C quaint:  Undetected.     11/03/2016 CT abdomen/pelvis with CM:  Flank pain Hepatobiliary: Surgically absent gallbladder. Chronic intra and extrahepatic biliary ductal prominence appears not significantly changed. Liver enhancement otherwise within normal limits.   IMPRESSION: 1. Increased but nonspecific bilateral perinephric stranding/edema compared to a 2011 CT Abdomen and Pelvis. Renal enhancement and contrast excretion appears normal such that there is no evidence of obstructive uropathy or pyelonephritis. Query acute intrinsic renal disease (e.g. glomerulonephritis, etc). 2. Otherwise no acute or inflammatory process identified in the abdomen or pelvis. Normal appendix. 3. Mild Calcified aortic atherosclerosis. 4. Previous L4-L5 and L5-S1 fusion with evidence of right S1 pedicle screw loosening and absent or suboptimal arthrodesis at L5-S1.    Review of Systems Past Medical History:  Diagnosis Date  . Back pain   . Hepatic cirrhosis (HCC) 08/28/2016  . Hepatitis C   . Thrombocytopenia (HCC) 01/22/2015  . Thyroid disease     Past Surgical History:  Procedure Laterality Date  . BACK SURGERY    .  CHOLECYSTECTOMY      Allergies  Allergen Reactions  . Codeine Rash    Current Outpatient Prescriptions on File Prior to Visit  Medication Sig Dispense Refill  . ALPRAZolam (XANAX) 1 MG tablet Take 1 mg by mouth 3 (three) times daily.     Marland Kitchen aspirin EC 81 MG tablet Take 81 mg by mouth daily.     Marland Kitchen levothyroxine (SYNTHROID, LEVOTHROID) 150 MCG tablet Take 150 mcg by mouth daily before breakfast.    . Multiple Vitamin (MULTIVITAMIN) tablet Take 1 tablet by mouth daily.    Marland Kitchen oxyCODONE-acetaminophen (PERCOCET) 10-325 MG per tablet Take 1 tablet by mouth every 4 (four) hours as needed for pain.     . traZODone (DESYREL) 100 MG tablet TAKE ONE TABLET BY MOUTH AT BEDTIME AS NEEDED.  11   No current facility-administered medications on file prior to visit.         Objective:   Physical Exam Blood pressure (!) 154/80, pulse 68, temperature (!) 97.4 F (36.3 C), height 6' (1.829 m), weight 220 lb (99.8 kg).  Alert and oriented. Skin warm and dry. Oral mucosa is moist.   . Sclera anicteric, conjunctivae is pink. Thyroid not enlarged. No cervical lymphadenopathy. Lungs clear. Heart regular rate and rhythm.  Abdomen is soft. Bowel sounds are positive. No hepatomegaly. No abdominal masses felt. No tenderness.  No edema to lower extremities.         Assessment & Plan:  Hepatitis C. Doing well. Hep  C quaint and Hepatic today.  Ov in 1 year.

## 2017-02-25 NOTE — Patient Instructions (Signed)
OV in 1 year.  

## 2017-02-26 LAB — HEPATIC FUNCTION PANEL
ALBUMIN: 4.3 g/dL (ref 3.6–5.1)
ALT: 15 U/L (ref 9–46)
AST: 20 U/L (ref 10–35)
Alkaline Phosphatase: 76 U/L (ref 40–115)
BILIRUBIN TOTAL: 0.6 mg/dL (ref 0.2–1.2)
Bilirubin, Direct: 0.1 mg/dL (ref <=0.2)
Indirect Bilirubin: 0.5 mg/dL (ref 0.2–1.2)
TOTAL PROTEIN: 7.4 g/dL (ref 6.1–8.1)

## 2017-02-27 LAB — HEPATITIS C RNA QUANTITATIVE
HCV QUANT LOG: NOT DETECTED {Log_IU}/mL
HCV QUANT: NOT DETECTED [IU]/mL

## 2017-03-27 DIAGNOSIS — M1991 Primary osteoarthritis, unspecified site: Secondary | ICD-10-CM | POA: Diagnosis not present

## 2017-03-27 DIAGNOSIS — I10 Essential (primary) hypertension: Secondary | ICD-10-CM | POA: Diagnosis not present

## 2017-03-27 DIAGNOSIS — G894 Chronic pain syndrome: Secondary | ICD-10-CM | POA: Diagnosis not present

## 2017-03-27 DIAGNOSIS — Z23 Encounter for immunization: Secondary | ICD-10-CM | POA: Diagnosis not present

## 2017-03-27 DIAGNOSIS — E063 Autoimmune thyroiditis: Secondary | ICD-10-CM | POA: Diagnosis not present

## 2017-04-15 ENCOUNTER — Telehealth: Payer: Self-pay | Admitting: General Practice

## 2017-04-15 NOTE — Telephone Encounter (Signed)
Patient called in to schedule a screening tcs, however it was noted that the patient is established with Dr. Karilyn Cota.  He stated he will give Dr. Patty Sermons office a call

## 2017-05-06 DIAGNOSIS — G894 Chronic pain syndrome: Secondary | ICD-10-CM | POA: Diagnosis not present

## 2017-05-06 DIAGNOSIS — E782 Mixed hyperlipidemia: Secondary | ICD-10-CM | POA: Diagnosis not present

## 2017-05-06 DIAGNOSIS — Z1389 Encounter for screening for other disorder: Secondary | ICD-10-CM | POA: Diagnosis not present

## 2017-05-06 DIAGNOSIS — Z0001 Encounter for general adult medical examination with abnormal findings: Secondary | ICD-10-CM | POA: Diagnosis not present

## 2017-05-06 DIAGNOSIS — E063 Autoimmune thyroiditis: Secondary | ICD-10-CM | POA: Diagnosis not present

## 2017-05-14 ENCOUNTER — Encounter (INDEPENDENT_AMBULATORY_CARE_PROVIDER_SITE_OTHER): Payer: Self-pay | Admitting: *Deleted

## 2017-05-16 ENCOUNTER — Encounter (HOSPITAL_COMMUNITY): Payer: Self-pay | Admitting: Emergency Medicine

## 2017-05-16 ENCOUNTER — Emergency Department (HOSPITAL_COMMUNITY): Payer: Medicare Other

## 2017-05-16 ENCOUNTER — Observation Stay (HOSPITAL_COMMUNITY)
Admission: EM | Admit: 2017-05-16 | Discharge: 2017-05-18 | Disposition: A | Discharge status: Home / Self Care | Payer: Medicare Other | Attending: Family Medicine | Admitting: Family Medicine

## 2017-05-16 DIAGNOSIS — M6281 Muscle weakness (generalized): Secondary | ICD-10-CM | POA: Insufficient documentation

## 2017-05-16 DIAGNOSIS — K7469 Other cirrhosis of liver: Secondary | ICD-10-CM | POA: Insufficient documentation

## 2017-05-16 DIAGNOSIS — F17210 Nicotine dependence, cigarettes, uncomplicated: Secondary | ICD-10-CM | POA: Insufficient documentation

## 2017-05-16 DIAGNOSIS — R55 Syncope and collapse: Principal | ICD-10-CM | POA: Diagnosis present

## 2017-05-16 DIAGNOSIS — Z7982 Long term (current) use of aspirin: Secondary | ICD-10-CM | POA: Insufficient documentation

## 2017-05-16 DIAGNOSIS — S3993XA Unspecified injury of pelvis, initial encounter: Secondary | ICD-10-CM | POA: Diagnosis not present

## 2017-05-16 DIAGNOSIS — S3991XA Unspecified injury of abdomen, initial encounter: Secondary | ICD-10-CM | POA: Diagnosis not present

## 2017-05-16 DIAGNOSIS — E039 Hypothyroidism, unspecified: Secondary | ICD-10-CM | POA: Diagnosis not present

## 2017-05-16 DIAGNOSIS — D696 Thrombocytopenia, unspecified: Secondary | ICD-10-CM | POA: Diagnosis present

## 2017-05-16 DIAGNOSIS — Z79899 Other long term (current) drug therapy: Secondary | ICD-10-CM | POA: Insufficient documentation

## 2017-05-16 DIAGNOSIS — I6789 Other cerebrovascular disease: Secondary | ICD-10-CM | POA: Diagnosis not present

## 2017-05-16 DIAGNOSIS — R251 Tremor, unspecified: Secondary | ICD-10-CM | POA: Diagnosis not present

## 2017-05-16 DIAGNOSIS — I639 Cerebral infarction, unspecified: Secondary | ICD-10-CM | POA: Diagnosis not present

## 2017-05-16 DIAGNOSIS — K7460 Unspecified cirrhosis of liver: Secondary | ICD-10-CM | POA: Diagnosis present

## 2017-05-16 HISTORY — DX: Other chronic pain: G89.29

## 2017-05-16 HISTORY — DX: Hypothyroidism, unspecified: E03.9

## 2017-05-16 LAB — URINALYSIS, ROUTINE W REFLEX MICROSCOPIC
BACTERIA UA: NONE SEEN
Bilirubin Urine: NEGATIVE
Glucose, UA: NEGATIVE mg/dL
HGB URINE DIPSTICK: NEGATIVE
Ketones, ur: NEGATIVE mg/dL
LEUKOCYTES UA: NEGATIVE
Nitrite: NEGATIVE
PROTEIN: 100 mg/dL — AB
SPECIFIC GRAVITY, URINE: 1.031 — AB (ref 1.005–1.030)
pH: 5 (ref 5.0–8.0)

## 2017-05-16 LAB — HEPATIC FUNCTION PANEL
ALBUMIN: 5.1 g/dL — AB (ref 3.5–5.0)
ALK PHOS: 68 U/L (ref 38–126)
ALT: 16 U/L — ABNORMAL LOW (ref 17–63)
AST: 21 U/L (ref 15–41)
BILIRUBIN INDIRECT: 1 mg/dL — AB (ref 0.3–0.9)
Bilirubin, Direct: 0.3 mg/dL (ref 0.1–0.5)
Total Bilirubin: 1.3 mg/dL — ABNORMAL HIGH (ref 0.3–1.2)
Total Protein: 8.9 g/dL — ABNORMAL HIGH (ref 6.5–8.1)

## 2017-05-16 LAB — BASIC METABOLIC PANEL
Anion gap: 9 (ref 5–15)
BUN: 17 mg/dL (ref 6–20)
CHLORIDE: 97 mmol/L — AB (ref 101–111)
CO2: 28 mmol/L (ref 22–32)
CREATININE: 1.03 mg/dL (ref 0.61–1.24)
Calcium: 10.1 mg/dL (ref 8.9–10.3)
GFR calc Af Amer: >60 mL/min (ref >60)
GFR calc non Af Amer: >60 mL/min (ref >60)
GLUCOSE: 111 mg/dL — AB (ref 65–99)
Potassium: 3.4 mmol/L — ABNORMAL LOW (ref 3.5–5.1)
Sodium: 134 mmol/L — ABNORMAL LOW (ref 135–145)

## 2017-05-16 LAB — TROPONIN I

## 2017-05-16 LAB — DIFFERENTIAL
Basophils Absolute: 0 10*3/uL (ref 0.0–0.1)
Basophils Relative: 0 %
EOS PCT: 0 %
Eosinophils Absolute: 0 10*3/uL (ref 0.0–0.7)
LYMPHS ABS: 1.9 10*3/uL (ref 0.7–4.0)
Lymphocytes Relative: 20 %
MONO ABS: 0.5 10*3/uL (ref 0.1–1.0)
MONOS PCT: 6 %
NEUTROS ABS: 6.9 10*3/uL (ref 1.7–7.7)
Neutrophils Relative %: 74 %

## 2017-05-16 LAB — CBG MONITORING, ED: Glucose-Capillary: 116 mg/dL — ABNORMAL HIGH (ref 65–99)

## 2017-05-16 LAB — CBC
HEMATOCRIT: 48.7 % (ref 39.0–52.0)
Hemoglobin: 16.9 g/dL (ref 13.0–17.0)
MCH: 31.9 pg (ref 26.0–34.0)
MCHC: 34.7 g/dL (ref 30.0–36.0)
MCV: 92.1 fL (ref 78.0–100.0)
PLATELETS: 110 10*3/uL — AB (ref 150–400)
RBC: 5.29 MIL/uL (ref 4.22–5.81)
RDW: 13 % (ref 11.5–15.5)
WBC: 9.4 10*3/uL (ref 4.0–10.5)

## 2017-05-16 MED ORDER — ENSURE ENLIVE PO LIQD
237.0000 mL | Freq: Two times a day (BID) | ORAL | Status: DC
Start: 2017-05-17 — End: 2017-05-18
  Administered 2017-05-17 – 2017-05-18 (×3): 237 mL via ORAL

## 2017-05-16 MED ORDER — ENOXAPARIN SODIUM 40 MG/0.4ML ~~LOC~~ SOLN
40.0000 mg | SUBCUTANEOUS | Status: DC
  Administered 2017-05-16 – 2017-05-17 (×2): 40 mg via SUBCUTANEOUS
  Filled 2017-05-16 (×2): qty 0.4

## 2017-05-16 MED ORDER — LEVOTHYROXINE SODIUM 75 MCG PO TABS
150.0000 ug | ORAL_TABLET | Freq: Every day | ORAL | Status: DC
  Administered 2017-05-17: 150 ug via ORAL
  Filled 2017-05-16: qty 2

## 2017-05-16 MED ORDER — ASPIRIN EC 81 MG PO TBEC
81.0000 mg | DELAYED_RELEASE_TABLET | Freq: Every day | ORAL | Status: DC
  Administered 2017-05-17 – 2017-05-18 (×2): 81 mg via ORAL
  Filled 2017-05-16 (×2): qty 1

## 2017-05-16 MED ORDER — ONDANSETRON HCL 4 MG PO TABS: 4.0000 mg | ORAL_TABLET | Freq: Four times a day (QID) | ORAL | Status: DC | PRN

## 2017-05-16 MED ORDER — OXYCODONE-ACETAMINOPHEN 5-325 MG PO TABS
2.0000 | ORAL_TABLET | Freq: Once | ORAL | Status: AC
  Administered 2017-05-16: 2 via ORAL
  Filled 2017-05-16: qty 2

## 2017-05-16 MED ORDER — ALPRAZOLAM 1 MG PO TABS
1.0000 mg | ORAL_TABLET | Freq: Three times a day (TID) | ORAL | Status: DC
  Administered 2017-05-16 – 2017-05-18 (×6): 1 mg via ORAL
  Filled 2017-05-16 (×6): qty 1

## 2017-05-16 MED ORDER — OXYCODONE-ACETAMINOPHEN 5-325 MG PO TABS
1.0000 | ORAL_TABLET | ORAL | Status: DC | PRN
  Administered 2017-05-16 – 2017-05-17 (×2): 1 via ORAL
  Filled 2017-05-16 (×4): qty 1

## 2017-05-16 MED ORDER — ALPRAZOLAM 0.5 MG PO TABS
1.0000 mg | ORAL_TABLET | Freq: Once | ORAL | Status: AC
  Administered 2017-05-16: 1 mg via ORAL

## 2017-05-16 MED ORDER — SODIUM CHLORIDE 0.9 % IV BOLUS (SEPSIS)
500.0000 mL | Freq: Once | INTRAVENOUS | Status: AC
  Administered 2017-05-16: 500 mL via INTRAVENOUS

## 2017-05-16 MED ORDER — SODIUM CHLORIDE 0.9% FLUSH
3.0000 mL | Freq: Two times a day (BID) | INTRAVENOUS | Status: DC
  Administered 2017-05-16 – 2017-05-17 (×3): 3 mL via INTRAVENOUS

## 2017-05-16 MED ORDER — TRAZODONE HCL 50 MG PO TABS
100.0000 mg | ORAL_TABLET | Freq: Every evening | ORAL | Status: DC | PRN
  Administered 2017-05-16: 100 mg via ORAL
  Filled 2017-05-16: qty 2

## 2017-05-16 MED ORDER — ALPRAZOLAM 0.5 MG PO TABS
ORAL_TABLET | ORAL | Status: AC
  Administered 2017-05-16: 1 mg via ORAL
  Filled 2017-05-16: qty 2

## 2017-05-16 MED ORDER — ONDANSETRON HCL 4 MG/2ML IJ SOLN
4.0000 mg | Freq: Four times a day (QID) | INTRAMUSCULAR | Status: DC | PRN
  Administered 2017-05-17 – 2017-05-18 (×2): 4 mg via INTRAVENOUS
  Filled 2017-05-16 (×3): qty 2

## 2017-05-16 MED ORDER — IOPAMIDOL (ISOVUE-300) INJECTION 61%
100.0000 mL | Freq: Once | INTRAVENOUS | Status: AC | PRN
  Administered 2017-05-16: 100 mL via INTRAVENOUS

## 2017-05-16 NOTE — ED Triage Notes (Signed)
Pt reports went to take trash can out to the road this am. Pt reports "that's the last thing I remember". Pt denies drug use. Per EMS, pt roommates stated "loc" occurred this am around 7am and reports brought patient back in "to warm up". EMS called at 1200. Pt alert and oriented at time of EMS arrival and pt arrival to ED. Pt denies pain. CBG with fire department 122.

## 2017-05-16 NOTE — H&P (Signed)
History and Physical  Jeremy Weeks:[id number] DOB: March 01, 1961 DOA: 05/16/2017  Referring physician: Dr Aileen Pilot, ED physician PCP: [name], MD  Outpatient Specialists: none  Patient Coming From: Home  Chief Complaint: Syncope  HPI: Jeremy Weeks is a 56 y.o. male with a history of back pain, thyroid disease, thrombocytopenia, hepatitis C with hepatic cirrhosis.  Patient seen due to syncopal episode that occurred earlier today.  Patient was feeling otherwise well.  He took the trash out around 7 AM and walked back towards the house.  The next thing he remembered was waking up inside.  His friend said that he did not come back for an hour and was found lying in the backyard.  Patient tremulous but denies lightheadedness, dizziness, chest pain, shortness of breath, fevers, chills.  Has never had a prior episode of syncope.  He does report having a few episodes of palpitations over the past couple of weeks.  No other palliating or provoking factors.  Emergency Department Course: CT head normal.  CT abdomen pelvis without acute findings.  No arrhythmias on telemetry.  Review of Systems:   Pt denies any fevers, chills, nausea, vomiting, diarrhea, constipation, abdominal pain, shortness of breath, dyspnea on exertion, orthopnea, cough, wheezing, palpitations, headache, vision changes, lightheadedness, dizziness, melena, rectal bleeding.  Review of systems are otherwise negative  Past Medical History:  Diagnosis Date  . Back pain   . Hepatic cirrhosis (HCC) 08/28/2016  . Hepatitis C   . Thrombocytopenia (HCC) 01/22/2015  . Thyroid disease    Past Surgical History:  Procedure Laterality Date  . BACK SURGERY    . CHOLECYSTECTOMY     Social History:  reports that he has been smoking Cigarettes.  He has a 74.00 pack-year smoking history. He has never used smokeless tobacco. He reports that he does not drink alcohol or use drugs. Patient lives at home  Allergies  Allergen  Reactions  . Codeine Rash    Family History  Problem Relation Age of Onset  . Cancer Mother       Prior to Admission medications   Medication Sig Start Date End Date Taking? Authorizing Provider  ALPRAZolam Prudy Feeler) 1 MG tablet Take 1 mg by mouth 3 (three) times daily.    Yes [provider]  Ascorbic Acid (VITAMIN C PO) Take 1 tablet by mouth daily.   Yes [provider]  aspirin EC 81 MG tablet Take 81 mg by mouth daily.    Yes [provider]  Cyanocobalamin (VITAMIN B-12 PO) Take 1 tablet by mouth daily.   Yes [provider]  levothyroxine (SYNTHROID, LEVOTHROID) 150 MCG tablet Take 150 mcg by mouth daily before breakfast.   Yes [provider]  oxyCODONE-acetaminophen (PERCOCET) 10-325 MG per tablet Take 1 tablet by mouth every 4 (four) hours as needed for pain.    Yes [provider]  traZODone (DESYREL) 100 MG tablet TAKE ONE TABLET BY MOUTH AT BEDTIME AS NEEDED. 01/16/16  Yes [provider]    Physical Exam: BP (!) 172/98   Pulse (!) 57   Temp 97.8 F (36.6 C) (Oral)   Resp 15   Ht 5\' 11"  (1.803 m)   Wt 102.5 kg (226 lb)   SpO2 96%   BMI 31.52 kg/m   General: Middle-aged Caucasian male. Awake and alert and oriented x3. No acute cardiopulmonary distress.  HEENT: Normocephalic atraumatic.  Right and left ears normal in appearance.  Pupils equal, round, reactive to light. Extraocular muscles are  intact. Sclerae anicteric and noninjected.  Moist mucosal membranes. No mucosal lesions.  Neck: Neck supple without lymphadenopathy. No carotid bruits. No masses palpated.  Cardiovascular: Regular rate with normal S1-S2 sounds. No murmurs, rubs, gallops auscultated. No JVD.  Respiratory: Good respiratory effort with no wheezes, rales, rhonchi. Lungs clear to auscultation bilaterally.  No accessory muscle use. Abdomen: Soft, nontender, nondistended. Active bowel sounds. No masses or hepatosplenomegaly  Skin: No rashes,  lesions, or ulcerations.  Dry, warm to touch. 2+ dorsalis pedis and radial pulses. Musculoskeletal: No calf or leg pain. All major joints not erythematous nontender.  No upper or lower joint deformation.  Good ROM.  No contractures  Psychiatric: Intact judgment and insight. Pleasant and cooperative. Neurologic: No focal neurological deficits. Strength is 5/5 and symmetric in upper and lower extremities.  Cranial nerves II through XII are grossly intact.           Labs on Admission: I have personally reviewed following labs and imaging studies  CBC:  Recent Labs Lab 05/16/17 1242  WBC 9.4  NEUTROABS 6.9  HGB 16.9  HCT 48.7  MCV 92.1  PLT 110*   Basic Metabolic Panel:  Recent Labs Lab 05/16/17 1242  NA 134*  K 3.4*  CL 97*  CO2 28  GLUCOSE 111*  BUN 17  CREATININE 1.03  CALCIUM 10.1   GFR: Estimated Creatinine Clearance: 98.8 mL/min (by C-G formula based on SCr of 1.03 mg/dL). Liver Function Tests:  Recent Labs Lab 05/16/17 1242  AST 21  ALT 16*  ALKPHOS 68  BILITOT 1.3*  PROT 8.9*  ALBUMIN 5.1*   No results for input(s): LIPASE, AMYLASE in the last 168 hours. No results for input(s): AMMONIA in the last 168 hours. Coagulation Profile: No results for input(s): INR, PROTIME in the last 168 hours. Cardiac Enzymes:  Recent Labs Lab 05/16/17 1242  TROPONINI <0.03   BNP (last 3 results) No results for input(s): PROBNP in the last 8760 hours. HbA1C: No results for input(s): HGBA1C in the last 72 hours. CBG:  Recent Labs Lab 05/16/17 1253  GLUCAP 116*   Lipid Profile: No results for input(s): CHOL, HDL, LDLCALC, TRIG, CHOLHDL, LDLDIRECT in the last 72 hours. Thyroid Function Tests: No results for input(s): TSH, T4TOTAL, FREET4, T3FREE, THYROIDAB in the last 72 hours. Anemia Panel: No results for input(s): VITAMINB12, FOLATE, FERRITIN, TIBC, IRON, RETICCTPCT in the last 72 hours. Urine analysis:    Component Value Date/Time   COLORURINE AMBER (A)  05/16/2017 1226   APPEARANCEUR HAZY (A) 05/16/2017 1226   LABSPEC 1.031 (H) 05/16/2017 1226   PHURINE 5.0 05/16/2017 1226   GLUCOSEU NEGATIVE 05/16/2017 1226   HGBUR NEGATIVE 05/16/2017 1226   BILIRUBINUR NEGATIVE 05/16/2017 1226   KETONESUR NEGATIVE 05/16/2017 1226   PROTEINUR 100 (A) 05/16/2017 1226   UROBILINOGEN 4.0 (H) 01/18/2015 1557   NITRITE NEGATIVE 05/16/2017 1226   LEUKOCYTESUR NEGATIVE 05/16/2017 1226   Sepsis Labs: @LABRCNTIP (procalcitonin:4,lacticidven:4) )No results found for this or any previous visit (from the past 240 hour(s)).   Radiological Exams on Admission: Ct Head Wo Contrast  Result Date: 05/16/2017 CLINICAL DATA:  56 year old male with TIA. EXAM: CT HEAD WITHOUT CONTRAST TECHNIQUE: Contiguous axial images were obtained from the base of the skull through the vertex without intravenous contrast. COMPARISON:  01/11/2012 FINDINGS: Brain: No evidence of acute infarction, hemorrhage, hydrocephalus, extra-axial collection or mass lesion/mass effect. Vascular: No hyperdense vessel or unexpected calcification. Skull: Normal. Negative for fracture or focal lesion. Sinuses/Orbits: No acute finding. Other: None. IMPRESSION:  Unremarkable CT evaluation of the brain. Electronically Signed   By: [name] M.D.   On: 05/16/2017 14:51   Ct Abdomen Pelvis W Contrast  Result Date: 05/16/2017 CLINICAL DATA:  Pt reports went to take trash can out to the road this am. Pt reports "that's the last thing I remember". Pt denies drug use. Per EMS, pt roommates stated "loc" occurred this am around 7am and reports brought patient back in "to warm up". EMS called at 1200. EXAM: CT ABDOMEN AND PELVIS WITH CONTRAST TECHNIQUE: Multidetector CT imaging of the abdomen and pelvis was performed using the standard protocol following bolus administration of intravenous contrast. CONTRAST:  ISOVUE-300 IOPAMIDOL (ISOVUE-300) INJECTION 61% COMPARISON:  11/03/2016 FINDINGS: Lower chest: No acute  abnormality. Hepatobiliary: Liver normal in size attenuation. There is some central volume loss and surface nodularity that suggests cirrhosis. No mass or focal lesion. Gallbladder surgically absent. Common bile duct is dilated with distal tapering, similar to the prior study. No duct stone. Pancreas: Unremarkable. No pancreatic ductal dilatation or surrounding inflammatory changes. Spleen: Normal in size without focal abnormality. Adrenals/Urinary Tract: No adrenal masses. Two left renal cysts, largest from the anterior mid to upper pole measuring 4 cm. The other is from the lower pole measuring 13 mm. No other renal masses, no stones and no hydronephrosis. Ureters normal in course and in caliber. Bladder is unremarkable. Stomach/Bowel: Stomach is within normal limits. Appendix appears normal. No evidence of bowel wall thickening, distention, or inflammatory changes. Vascular/Lymphatic: Mild aortic atherosclerosis. No pathologically enlarged lymph nodes. Reproductive: Mild prominence of the prostate gland. Other: No abdominal wall hernia or abnormality. No abdominopelvic ascites. Musculoskeletal: Status post posterior fusion at L4, L5 and S1. Orthopedic hardware is well-seated and positioned and unchanged. No fracture or acute finding. No osteoblastic or osteolytic lesions. IMPRESSION: 1. No acute findings within the abdomen or pelvis. 2. Morphologic changes of the liver consistent with cirrhosis. 3. Chronic dilation of the common bile duct. Status post cholecystectomy. 4. Two left renal cysts, stable. 5. Aortic atherosclerosis. Electronically Signed   By: Amie Portland M.D.   On: 05/16/2017 17:53   Dg Chest Port 1 View  Result Date: 05/16/2017 CLINICAL DATA:  56 year old male with syncope. EXAM: PORTABLE CHEST 1 VIEW COMPARISON:  01/18/2015 FINDINGS: The heart size and mediastinal contours are within normal limits. Both lungs are clear. The visualized skeletal structures are unremarkable. IMPRESSION: No active  disease. Electronically Signed   By: [name] M.D.   On: 05/16/2017 15:11    EKG: Independently reviewed.  Sinus rhythm.  No acute ST changes.  No arrhythmias or PVCs  Assessment/Plan: Principal Problem:   Syncope Active Problems:   Thrombocytopenia (HCC)   Hepatic cirrhosis (HCC)   Hypothyroidism    This patient was discussed with the ED physician, including pertinent vitals, physical exam findings, labs, and imaging.  We also discussed care given by the ED provider.  1. Syncope  Observation on telemetry  Echo tomorrow morning  We will check TSH  Recheck CMP 2.  Hepatic cirrhosis  Stable 3.  Thrombocytopenia  Stable 4.  Hypothyroidism  Check TSH  Continue levothyroxine  DVT prophylaxis: Lovenox Consultants: None Code Status: Full code Family Communication: None Disposition Plan: Patient to return home after hospital stay   Levie Heritage, DO [facility] Pager [phone]  If 7PM-7AM, please contact night-coverage [email or web address] Password [email or web address]

## 2017-05-16 NOTE — ED Notes (Addendum)
Generalized trembling noted several times. Pt easily arousable when pt name stated. Pt in NSR, vss remain stable during these episodes. Pt reports " I haven't had my xanax this morning, I usually take every four hours."  EDP aware. No other orders given.  Pt stated was cold. Warm blanket x2 provided.

## 2017-05-16 NOTE — ED Notes (Signed)
Pt taken to CT at this time. This RN inquired about CT ABD Pelvis had not resulted. CT staff reported due to pt shaking at time of initial attempt pt was returned to ED until "shaking" subsided. CT aware pt medicated for anxiety and trembling.

## 2017-05-16 NOTE — ED Provider Notes (Signed)
[facility] Provider Note   CSN: [id number] Arrival date & time: 05/16/17  1217     History   Chief Complaint Chief Complaint  Patient presents with  . Loss of Consciousness    HPI Jeremy DarlingJoseph M Weeks is a 56 y.o. male.  Patient states that he walked the trash can out to the street and the next thing he remembers is waking up in the house.  Supposedly he passed out and some of the health bottom and side.  He did not have any dizziness   The history is provided by the patient.  Loss of Consciousness   This is a new problem. The current episode started 1 to 2 hours ago. The problem occurs rarely. The problem has been resolved. He lost consciousness for a period of greater than 5 minutes. The problem is associated with normal activity. Pertinent negatives include abdominal pain, back pain, chest pain, congestion, headaches, palpitations and seizures. He has tried nothing for the symptoms. The treatment provided no relief. His past medical history does not include CVA.    Past Medical History:  Diagnosis Date  . Back pain   . Hepatic cirrhosis (HCC) 08/28/2016  . Hepatitis C   . Thrombocytopenia (HCC) 01/22/2015  . Thyroid disease     Patient Active Problem List   Diagnosis Date Noted  . Syncope 05/16/2017  . Hepatic cirrhosis (HCC) 08/28/2016  . Hepatitis C 04/05/2015  . Thrombocytopenia (HCC) 01/22/2015    Past Surgical History:  Procedure Laterality Date  . BACK SURGERY    . CHOLECYSTECTOMY         Home Medications    Prior to Admission medications   Medication Sig Start Date End Date Taking? Authorizing Provider  ALPRAZolam Prudy Feeler(XANAX) 1 MG tablet Take 1 mg by mouth 3 (three) times daily.    Yes [provider]  Ascorbic Acid (VITAMIN C PO) Take 1 tablet by mouth daily.   Yes [provider]  aspirin EC 81 MG tablet Take 81 mg by mouth daily.    Yes [provider]  Cyanocobalamin (VITAMIN B-12 PO) Take 1 tablet by  mouth daily.   Yes [provider]  levothyroxine (SYNTHROID, LEVOTHROID) 150 MCG tablet Take 150 mcg by mouth daily before breakfast.   Yes [provider]  oxyCODONE-acetaminophen (PERCOCET) 10-325 MG per tablet Take 1 tablet by mouth every 4 (four) hours as needed for pain.    Yes [provider]  traZODone (DESYREL) 100 MG tablet TAKE ONE TABLET BY MOUTH AT BEDTIME AS NEEDED. 01/16/16  Yes [provider]    Family History Family History  Problem Relation Age of Onset  . Cancer Mother     Social History Social History  Substance Use Topics  . Smoking status: Current Every Day Smoker    Packs/day: 2.00    Years: 37.00    Types: Cigarettes  . Smokeless tobacco: Never Used  . Alcohol use No     Comment: quit drinking 26 yrs     Allergies   Codeine   Review of Systems Review of Systems  Constitutional: Negative for appetite change and fatigue.  HENT: Negative for congestion, ear discharge and sinus pressure.   Eyes: Negative for discharge.  Respiratory: Negative for cough.   Cardiovascular: Positive for syncope. Negative for chest pain and palpitations.  Gastrointestinal: Negative for abdominal pain and diarrhea.  Genitourinary: Negative for frequency and hematuria.  Musculoskeletal: Negative for back pain.  Skin: Negative for rash.  Neurological: Positive for syncope. Negative for seizures and headaches.  Psychiatric/Behavioral: Negative for hallucinations.     Physical Exam Updated Vital Signs BP (!) 172/98   Pulse (!) 57   Temp 97.8 F (36.6 C) (Oral)   Resp 15   Ht 5\' 11"  (1.803 m)   Wt 102.5 kg (226 lb)   SpO2 96%   BMI 31.52 kg/m   Physical Exam  Constitutional: He is oriented to person, place, and time. He appears well-developed.  HENT:  Head: Normocephalic.  Eyes: Conjunctivae and EOM are normal. No scleral icterus.  Neck: Neck supple. No thyromegaly present.  Cardiovascular: Normal rate and regular rhythm.   Exam reveals no gallop and no friction rub.   No murmur heard. Pulmonary/Chest: No stridor. He has no wheezes. He has no rales. He exhibits no tenderness.  Abdominal: He exhibits no distension. There is no tenderness. There is no rebound.  Musculoskeletal: Normal range of motion. He exhibits no edema.  Lymphadenopathy:    He has no cervical adenopathy.  Neurological: He is oriented to person, place, and time. He exhibits normal muscle tone. Coordination normal.  Skin: No rash noted. No erythema.  Psychiatric: He has a normal mood and affect. His behavior is normal.     ED Treatments / Results  Labs (all labs ordered are listed, but only abnormal results are displayed) Labs Reviewed  BASIC METABOLIC PANEL - Abnormal; Notable for the following:       Result Value   Sodium 134 (*)    Potassium 3.4 (*)    Chloride 97 (*)    Glucose, Bld 111 (*)    All other components within normal limits  CBC - Abnormal; Notable for the following:    Platelets 110 (*)    All other components within normal limits  URINALYSIS, ROUTINE W REFLEX MICROSCOPIC - Abnormal; Notable for the following:    Color, Urine AMBER (*)    APPearance HAZY (*)    Specific Gravity, Urine 1.031 (*)    Protein, ur 100 (*)    Squamous Epithelial / LPF 0-5 (*)    All other components within normal limits  HEPATIC FUNCTION PANEL - Abnormal; Notable for the following:    Total Protein 8.9 (*)    Albumin 5.1 (*)    ALT 16 (*)    Total Bilirubin 1.3 (*)    Indirect Bilirubin 1.0 (*)    All other components within normal limits  CBG MONITORING, ED - Abnormal; Notable for the following:    Glucose-Capillary 116 (*)    All other components within normal limits  URINE CULTURE  DIFFERENTIAL  TROPONIN I    EKG  EKG Interpretation  Date/Time:  Saturday May 16 2017 12:23:20 EDT Ventricular Rate:  63 PR Interval:    QRS Duration: 105 QT Interval:  418 QTC Calculation: 428 R Axis:   14 Text Interpretation:   Sinus rhythm Probable anteroseptal infarct, old Confirmed by Bethann Berkshire [phone]) on 05/16/2017 6:15:15 PM       Radiology Ct Head Wo Contrast  Result Date: 05/16/2017 CLINICAL DATA:  56 year old male with TIA. EXAM: CT HEAD WITHOUT CONTRAST TECHNIQUE: Contiguous axial images were obtained from the base of the skull through the vertex without intravenous contrast. COMPARISON:  01/11/2012 FINDINGS: Brain: No evidence of acute infarction, hemorrhage, hydrocephalus, extra-axial collection or mass lesion/mass effect. Vascular: No hyperdense vessel or unexpected calcification. Skull: Normal. Negative for fracture or focal lesion. Sinuses/Orbits: No acute finding. Other: None. IMPRESSION: Unremarkable CT evaluation  of the brain. Electronically Signed   By: [name] M.D.   On: 05/16/2017 14:51   Ct Abdomen Pelvis W Contrast  Result Date: 05/16/2017 CLINICAL DATA:  Pt reports went to take trash can out to the road this am. Pt reports "that's the last thing I remember". Pt denies drug use. Per EMS, pt roommates stated "loc" occurred this am around 7am and reports brought patient back in "to warm up". EMS called at 1200. EXAM: CT ABDOMEN AND PELVIS WITH CONTRAST TECHNIQUE: Multidetector CT imaging of the abdomen and pelvis was performed using the standard protocol following bolus administration of intravenous contrast. CONTRAST:  ISOVUE-300 IOPAMIDOL (ISOVUE-300) INJECTION 61% COMPARISON:  11/03/2016 FINDINGS: Lower chest: No acute abnormality. Hepatobiliary: Liver normal in size attenuation. There is some central volume loss and surface nodularity that suggests cirrhosis. No mass or focal lesion. Gallbladder surgically absent. Common bile duct is dilated with distal tapering, similar to the prior study. No duct stone. Pancreas: Unremarkable. No pancreatic ductal dilatation or surrounding inflammatory changes. Spleen: Normal in size without focal abnormality. Adrenals/Urinary Tract: No adrenal masses.  Two left renal cysts, largest from the anterior mid to upper pole measuring 4 cm. The other is from the lower pole measuring 13 mm. No other renal masses, no stones and no hydronephrosis. Ureters normal in course and in caliber. Bladder is unremarkable. Stomach/Bowel: Stomach is within normal limits. Appendix appears normal. No evidence of bowel wall thickening, distention, or inflammatory changes. Vascular/Lymphatic: Mild aortic atherosclerosis. No pathologically enlarged lymph nodes. Reproductive: Mild prominence of the prostate gland. Other: No abdominal wall hernia or abnormality. No abdominopelvic ascites. Musculoskeletal: Status post posterior fusion at L4, L5 and S1. Orthopedic hardware is well-seated and positioned and unchanged. No fracture or acute finding. No osteoblastic or osteolytic lesions. IMPRESSION: 1. No acute findings within the abdomen or pelvis. 2. Morphologic changes of the liver consistent with cirrhosis. 3. Chronic dilation of the common bile duct. Status post cholecystectomy. 4. Two left renal cysts, stable. 5. Aortic atherosclerosis. Electronically Signed   By: Amie Portland M.D.   On: 05/16/2017 17:53   Dg Chest Port 1 View  Result Date: 05/16/2017 CLINICAL DATA:  56 year old male with syncope. EXAM: PORTABLE CHEST 1 VIEW COMPARISON:  01/18/2015 FINDINGS: The heart size and mediastinal contours are within normal limits. Both lungs are clear. The visualized skeletal structures are unremarkable. IMPRESSION: No active disease. Electronically Signed   By: [name] M.D.   On: 05/16/2017 15:11    Procedures Procedures (including critical care time)  Medications Ordered in ED Medications  sodium chloride 0.9 % bolus 500 mL (0 mLs Intravenous Stopped 05/16/17 1400)  iopamidol (ISOVUE-300) 61 % injection 100 mL (100 mLs Intravenous Contrast Given 05/16/17 1405)  ALPRAZolam (XANAX) tablet 1 mg (1 mg Oral Given 05/16/17 1535)  oxyCODONE-acetaminophen (PERCOCET/ROXICET) 5-325 MG per  tablet 2 tablet (2 tablets Oral Given 05/16/17 1816)     Initial Impression / Assessment and Plan / ED Course  I have reviewed the triage vital signs and the nursing notes.  Pertinent labs & imaging results that were available during my care of the patient were reviewed by me and considered in my medical decision making (see chart for details).   Patient with syncope and collapse.  Patient will be admitted to medicine for further workup    Final Clinical Impressions(s) / ED Diagnoses   Final diagnoses:  Syncope and collapse    New Prescriptions New Prescriptions   No medications on file  Bethann Berkshire, MD 05/16/17 1843

## 2017-05-16 NOTE — ED Notes (Signed)
Pt becoming moderately anxious. Pt states " I haven't had any of medication including my nerve pill." Pt aware will notify EDP when available.

## 2017-05-17 ENCOUNTER — Observation Stay (HOSPITAL_BASED_OUTPATIENT_CLINIC_OR_DEPARTMENT_OTHER): Payer: Medicare Other

## 2017-05-17 DIAGNOSIS — R55 Syncope and collapse: Secondary | ICD-10-CM | POA: Diagnosis not present

## 2017-05-17 DIAGNOSIS — K7469 Other cirrhosis of liver: Secondary | ICD-10-CM

## 2017-05-17 DIAGNOSIS — D696 Thrombocytopenia, unspecified: Secondary | ICD-10-CM

## 2017-05-17 DIAGNOSIS — E039 Hypothyroidism, unspecified: Secondary | ICD-10-CM

## 2017-05-17 LAB — MRSA PCR SCREENING: MRSA by PCR: POSITIVE — AB

## 2017-05-17 LAB — BASIC METABOLIC PANEL
ANION GAP: 11 (ref 5–15)
BUN: 11 mg/dL (ref 6–20)
CO2: 26 mmol/L (ref 22–32)
Calcium: 9.7 mg/dL (ref 8.9–10.3)
Chloride: 99 mmol/L — ABNORMAL LOW (ref 101–111)
Creatinine, Ser: 0.95 mg/dL (ref 0.61–1.24)
GFR calc Af Amer: >60 mL/min (ref >60)
Glucose, Bld: 90 mg/dL (ref 65–99)
POTASSIUM: 3.9 mmol/L (ref 3.5–5.1)
Sodium: 136 mmol/L (ref 135–145)

## 2017-05-17 LAB — ECHOCARDIOGRAM COMPLETE
Height: 71 in
WEIGHTICAEL: 3182.4 [oz_av]

## 2017-05-17 LAB — GLUCOSE, CAPILLARY: GLUCOSE-CAPILLARY: 100 mg/dL — AB (ref 65–99)

## 2017-05-17 LAB — TSH: TSH: 0.03 u[IU]/mL — ABNORMAL LOW (ref 0.350–4.500)

## 2017-05-17 MED ORDER — OXYCODONE-ACETAMINOPHEN 5-325 MG PO TABS
1.0000 | ORAL_TABLET | ORAL | Status: DC | PRN
  Administered 2017-05-17: 1 via ORAL
  Filled 2017-05-17: qty 1

## 2017-05-17 MED ORDER — CHLORHEXIDINE GLUCONATE CLOTH 2 % EX PADS
6.0000 | MEDICATED_PAD | Freq: Every day | CUTANEOUS | Status: DC
  Administered 2017-05-17: 6 via TOPICAL

## 2017-05-17 MED ORDER — LEVOTHYROXINE SODIUM 25 MCG PO TABS
125.0000 ug | ORAL_TABLET | Freq: Every day | ORAL | Status: DC
  Administered 2017-05-18: 08:00:00 125 ug via ORAL
  Filled 2017-05-17: qty 1

## 2017-05-17 MED ORDER — OXYCODONE-ACETAMINOPHEN 5-325 MG PO TABS
1.0000 | ORAL_TABLET | Freq: Four times a day (QID) | ORAL | Status: DC | PRN
  Administered 2017-05-17 – 2017-05-18 (×3): 1 via ORAL
  Filled 2017-05-17 (×3): qty 1

## 2017-05-17 MED ORDER — OXYCODONE HCL 5 MG PO TABS
5.0000 mg | ORAL_TABLET | Freq: Four times a day (QID) | ORAL | Status: DC | PRN
  Administered 2017-05-17 – 2017-05-18 (×3): 5 mg via ORAL
  Filled 2017-05-17 (×3): qty 1

## 2017-05-17 MED ORDER — MUPIROCIN 2 % EX OINT
1.0000 "application " | TOPICAL_OINTMENT | Freq: Two times a day (BID) | CUTANEOUS | Status: DC
  Administered 2017-05-17 – 2017-05-18 (×3): 1 via NASAL
  Filled 2017-05-17 (×2): qty 22

## 2017-05-17 NOTE — Progress Notes (Signed)
  Echocardiogram 2D Echocardiogram has been performed.  Jeremy Weeks 05/17/2017, 1:43 PM

## 2017-05-17 NOTE — Progress Notes (Signed)
PROGRESS NOTE    Jeremy Weeks  NFA:[id number] DOB: March 30, 1961 DOA: 05/16/2017 PCP: [name], MD    Brief Narrative:  Jeremy Weeks is a 56 y.o. male with a history of back pain, thyroid disease, thrombocytopenia, hepatitis C with hepatic cirrhosis.  Patient seen due to syncopal episode that occurred earlier today.  Patient was feeling otherwise well.  He took the trash out around 7 AM and walked back towards the house.  The next thing he remembered was waking up inside.  His friend said that he did not come back for an hour and was found lying in the backyard.  Patient tremulous but denies lightheadedness, dizziness, chest pain, shortness of breath, fevers, chills.  Has never had a prior episode of syncope.  He does report having a few episodes of palpitations over the past couple of weeks.  No other palliating or provoking factors.  Emergency Department Course: CT head normal.  CT abdomen pelvis without acute findings.  No arrhythmias on telemetry.  Patient was admitted and placed on telemetry monitoring.     Assessment & Plan:   Principal Problem:   Syncope Active Problems:   Thrombocytopenia (HCC)   Hepatic cirrhosis (HCC)   Hypothyroidism   Syncope  Telemetry showing sinus bradycardia  Echo pending  tsh low- lowering levothyroxine  Consider cardiology consult pending echo results  Hepatic cirrhosis  Stable  Thrombocytopenia  Stable  Hypothyroidism  TSH low  Decrease dose of levothyroxine to   DVT prophylaxis: lovenox Code Status: Full code Family Communication: no family bedside  Disposition Plan: likely home after workup complete   Consultants:   Cardiology  Procedures:   none  Antimicrobials:   none    Subjective: Patient seen this am.  Says he has been feeling more and more weak for the past few weeks.  No syncopal episodes noted since admission.  Patient has had bradycardia on telemetry monitoring.  Tolerating diet  well.  Asking for pain medication 2/2 chronic pain.  Objective: Vitals:   05/16/17 1900 05/16/17 2020 05/17/17 0100 05/17/17 0534  BP: (!) 160/92  132/74 126/87  Pulse: (!) 52  60 71  Resp: 16 17 16 16   Temp:  97.9 F (36.6 C) 98.1 F (36.7 C) 97.6 F (36.4 C)  TempSrc:  Oral Oral Oral  SpO2: 95% 98% 97% 99%  Weight:  90.7 kg (199 lb 14.4 oz)  90.2 kg (198 lb 14.4 oz)  Height:  5\' 11"  (1.803 m)      Intake/Output Summary (Last 24 hours) at 05/17/2017 1529 Last data filed at 05/17/2017 0535 Gross per 24 hour  Intake -  Output 800 ml  Net -800 ml   Filed Weights   05/16/17 1220 05/16/17 2020 05/17/17 0534  Weight: 102.5 kg (226 lb) 90.7 kg (199 lb 14.4 oz) 90.2 kg (198 lb 14.4 oz)    Examination:  General exam: Appears calm and comfortable  Respiratory system: Clear to auscultation. Respiratory effort normal. Cardiovascular system: S1 & S2 heard, bradycardic. No JVD, murmurs, rubs, gallops or clicks. No pedal edema. Gastrointestinal system: Abdomen is nondistended, soft and nontender. No organomegaly or masses felt. Normal bowel sounds heard. Central nervous system: Alert and oriented. No focal neurological deficits. Extremities: Symmetric 5 x 5 power. Skin: No rashes, lesions or ulcers Psychiatry: Judgement and insight appear normal. Mood & affect appropriate.     Data Reviewed: I have personally reviewed following labs and imaging studies  CBC: Recent Labs  Lab 05/16/17 1242  WBC 9.4  NEUTROABS 6.9  HGB 16.9  HCT 48.7  MCV 92.1  PLT 110*   Basic Metabolic Panel: Recent Labs  Lab 05/16/17 1242 05/17/17 0631  NA 134* 136  K 3.4* 3.9  CL 97* 99*  CO2 28 26  GLUCOSE 111* 90  BUN 17 11  CREATININE 1.03 0.95  CALCIUM 10.1 9.7   GFR: Estimated Creatinine Clearance: 93.6 mL/min (by C-G formula based on SCr of 0.95 mg/dL). Liver Function Tests: Recent Labs  Lab 05/16/17 1242  AST 21  ALT 16*  ALKPHOS 68  BILITOT 1.3*  PROT 8.9*  ALBUMIN 5.1*    No results for input(s): LIPASE, AMYLASE in the last 168 hours. No results for input(s): AMMONIA in the last 168 hours. Coagulation Profile: No results for input(s): INR, PROTIME in the last 168 hours. Cardiac Enzymes: Recent Labs  Lab 05/16/17 1242  TROPONINI <0.03   BNP (last 3 results) No results for input(s): PROBNP in the last 8760 hours. HbA1C: No results for input(s): HGBA1C in the last 72 hours. CBG: Recent Labs  Lab 05/16/17 1253 05/17/17 0530  GLUCAP 116* 100*   Lipid Profile: No results for input(s): CHOL, HDL, LDLCALC, TRIG, CHOLHDL, LDLDIRECT in the last 72 hours. Thyroid Function Tests: Recent Labs    05/17/17 0631  TSH 0.030*   Anemia Panel: No results for input(s): VITAMINB12, FOLATE, FERRITIN, TIBC, IRON, RETICCTPCT in the last 72 hours. Sepsis Labs: No results for input(s): PROCALCITON, LATICACIDVEN in the last 168 hours.  Recent Results (from the past 240 hour(s))  MRSA PCR Screening     Status: Abnormal   Collection Time: 05/17/17  6:28 AM  Result Value Ref Range Status   MRSA by PCR POSITIVE (A) NEGATIVE Final    Comment:        The GeneXpert MRSA Assay (FDA approved for NASAL specimens only), is one component of a comprehensive MRSA colonization surveillance program. It is not intended to diagnose MRSA infection nor to guide or monitor treatment for MRSA infections. RESULT CALLED TO, READ BACK BY AND VERIFIED WITH: HAWKINS,M AT 1022 ON 05/17/2017 BY MOSLEY,J          Radiology Studies: Ct Head Wo Contrast  Result Date: 05/16/2017 CLINICAL DATA:  56 year old male with TIA. EXAM: CT HEAD WITHOUT CONTRAST TECHNIQUE: Contiguous axial images were obtained from the base of the skull through the vertex without intravenous contrast. COMPARISON:  01/11/2012 FINDINGS: Brain: No evidence of acute infarction, hemorrhage, hydrocephalus, extra-axial collection or mass lesion/mass effect. Vascular: No hyperdense vessel or unexpected  calcification. Skull: Normal. Negative for fracture or focal lesion. Sinuses/Orbits: No acute finding. Other: None. IMPRESSION: Unremarkable CT evaluation of the brain. Electronically Signed   By: [name] M.D.   On: 05/16/2017 14:51   Ct Abdomen Pelvis W Contrast  Result Date: 05/16/2017 CLINICAL DATA:  Pt reports went to take trash can out to the road this am. Pt reports "that's the last thing I remember". Pt denies drug use. Per EMS, pt roommates stated "loc" occurred this am around 7am and reports brought patient back in "to warm up". EMS called at 1200. EXAM: CT ABDOMEN AND PELVIS WITH CONTRAST TECHNIQUE: Multidetector CT imaging of the abdomen and pelvis was performed using the standard protocol following bolus administration of intravenous contrast. CONTRAST:  ISOVUE-300 IOPAMIDOL (ISOVUE-300) INJECTION 61% COMPARISON:  11/03/2016 FINDINGS: Lower chest: No acute abnormality. Hepatobiliary: Liver normal in size attenuation. There is some central volume loss and surface nodularity that suggests cirrhosis. No  mass or focal lesion. Gallbladder surgically absent. Common bile duct is dilated with distal tapering, similar to the prior study. No duct stone. Pancreas: Unremarkable. No pancreatic ductal dilatation or surrounding inflammatory changes. Spleen: Normal in size without focal abnormality. Adrenals/Urinary Tract: No adrenal masses. Two left renal cysts, largest from the anterior mid to upper pole measuring 4 cm. The other is from the lower pole measuring 13 mm. No other renal masses, no stones and no hydronephrosis. Ureters normal in course and in caliber. Bladder is unremarkable. Stomach/Bowel: Stomach is within normal limits. Appendix appears normal. No evidence of bowel wall thickening, distention, or inflammatory changes. Vascular/Lymphatic: Mild aortic atherosclerosis. No pathologically enlarged lymph nodes. Reproductive: Mild prominence of the prostate gland. Other: No abdominal wall  hernia or abnormality. No abdominopelvic ascites. Musculoskeletal: Status post posterior fusion at L4, L5 and S1. Orthopedic hardware is well-seated and positioned and unchanged. No fracture or acute finding. No osteoblastic or osteolytic lesions. IMPRESSION: 1. No acute findings within the abdomen or pelvis. 2. Morphologic changes of the liver consistent with cirrhosis. 3. Chronic dilation of the common bile duct. Status post cholecystectomy. 4. Two left renal cysts, stable. 5. Aortic atherosclerosis. Electronically Signed   By: Amie Portlandavid  Ormond M.D.   On: 05/16/2017 17:53   Dg Chest Port 1 View  Result Date: 05/16/2017 CLINICAL DATA:  56 year old male with syncope. EXAM: PORTABLE CHEST 1 VIEW COMPARISON:  01/18/2015 FINDINGS: The heart size and mediastinal contours are within normal limits. Both lungs are clear. The visualized skeletal structures are unremarkable. IMPRESSION: No active disease. Electronically Signed   By: Sande BrothersSerena  Chacko M.D.   On: 05/16/2017 15:11        Scheduled Meds: . ALPRAZolam  1 mg Oral TID  . aspirin EC  81 mg Oral Daily  . Chlorhexidine Gluconate Cloth  6 each Topical Q0600  . enoxaparin (LOVENOX) injection  40 mg Subcutaneous Q24H  . feeding supplement (ENSURE ENLIVE)  237 mL Oral BID BM  . levothyroxine  150 mcg Oral QAC breakfast  . mupirocin ointment  1 application Nasal BID  . sodium chloride flush  3 mL Intravenous Q12H   Continuous Infusions:   LOS: 0 days    Time spent: 25 minutes    Katrinka BlazingAlex U Kadolph, MD [facility] Pager [phone]  If 7PM-7AM, please contact night-coverage [email or web address] Password [email or web address] 05/17/2017, 3:29 PM

## 2017-05-17 NOTE — Progress Notes (Signed)
CRITICAL VALUE ALERT  Critical Value:  MRSA positive PCR  Date & Time Notified:  05/17/17 1043  Provider Notified: Dr. Rinaldo RatelKadolph  Orders Received/Actions taken: Orders placed per set.

## 2017-05-18 ENCOUNTER — Encounter (HOSPITAL_COMMUNITY): Payer: Self-pay | Admitting: Cardiology

## 2017-05-18 ENCOUNTER — Other Ambulatory Visit: Payer: Self-pay

## 2017-05-18 DIAGNOSIS — E7849 Other hyperlipidemia: Secondary | ICD-10-CM | POA: Diagnosis not present

## 2017-05-18 DIAGNOSIS — R55 Syncope and collapse: Secondary | ICD-10-CM | POA: Diagnosis not present

## 2017-05-18 DIAGNOSIS — Z7982 Long term (current) use of aspirin: Secondary | ICD-10-CM | POA: Diagnosis not present

## 2017-05-18 DIAGNOSIS — K7469 Other cirrhosis of liver: Secondary | ICD-10-CM | POA: Diagnosis not present

## 2017-05-18 DIAGNOSIS — E038 Other specified hypothyroidism: Secondary | ICD-10-CM | POA: Diagnosis not present

## 2017-05-18 DIAGNOSIS — D696 Thrombocytopenia, unspecified: Secondary | ICD-10-CM | POA: Diagnosis not present

## 2017-05-18 DIAGNOSIS — E119 Type 2 diabetes mellitus without complications: Secondary | ICD-10-CM | POA: Diagnosis not present

## 2017-05-18 DIAGNOSIS — E039 Hypothyroidism, unspecified: Secondary | ICD-10-CM | POA: Diagnosis not present

## 2017-05-18 DIAGNOSIS — M545 Low back pain: Secondary | ICD-10-CM | POA: Diagnosis not present

## 2017-05-18 DIAGNOSIS — R2681 Unsteadiness on feet: Secondary | ICD-10-CM | POA: Diagnosis not present

## 2017-05-18 DIAGNOSIS — Z79899 Other long term (current) drug therapy: Secondary | ICD-10-CM | POA: Diagnosis not present

## 2017-05-18 DIAGNOSIS — K7460 Unspecified cirrhosis of liver: Secondary | ICD-10-CM | POA: Diagnosis not present

## 2017-05-18 DIAGNOSIS — D518 Other vitamin B12 deficiency anemias: Secondary | ICD-10-CM | POA: Diagnosis not present

## 2017-05-18 DIAGNOSIS — M6281 Muscle weakness (generalized): Secondary | ICD-10-CM | POA: Diagnosis not present

## 2017-05-18 LAB — URINE CULTURE

## 2017-05-18 LAB — GLUCOSE, CAPILLARY: Glucose-Capillary: 93 mg/dL (ref 65–99)

## 2017-05-18 LAB — HIV ANTIBODY (ROUTINE TESTING W REFLEX): HIV SCREEN 4TH GENERATION: NONREACTIVE

## 2017-05-18 MED ORDER — OXYCODONE-ACETAMINOPHEN 10-325 MG PO TABS: 1.0000 | ORAL_TABLET | ORAL | 0 refills | Status: DC | PRN

## 2017-05-18 MED ORDER — OXYCODONE HCL 5 MG PO TABS
5.0000 mg | ORAL_TABLET | Freq: Four times a day (QID) | ORAL | Status: DC | PRN
  Filled 2017-05-18: qty 1

## 2017-05-18 MED ORDER — ALPRAZOLAM 1 MG PO TABS: 1.0000 mg | ORAL_TABLET | Freq: Three times a day (TID) | ORAL | 0 refills | Status: DC

## 2017-05-18 MED ORDER — OXYCODONE-ACETAMINOPHEN 5-325 MG PO TABS
1.0000 | ORAL_TABLET | ORAL | Status: DC | PRN
Start: 2017-05-18 — End: 2017-05-18
  Filled 2017-05-18: qty 1

## 2017-05-18 MED ORDER — OXYCODONE HCL 5 MG PO TABS: 5.0000 mg | ORAL_TABLET | ORAL | Status: DC | PRN

## 2017-05-18 MED ORDER — OXYCODONE-ACETAMINOPHEN 5-325 MG PO TABS
1.0000 | ORAL_TABLET | ORAL | Status: DC | PRN
  Administered 2017-05-18: 1 via ORAL
  Filled 2017-05-18: qty 1

## 2017-05-18 MED ORDER — LEVOTHYROXINE SODIUM 125 MCG PO TABS: 125.0000 ug | ORAL_TABLET | Freq: Every day | ORAL | 0 refills | Status: DC

## 2017-05-18 NOTE — Progress Notes (Signed)
[facility]  Family transported to facility via private automobile with family in stable condition.

## 2017-05-18 NOTE — Discharge Summary (Signed)
Physician Discharge Summary  Jeremy Weeks:[id number] DOB: 09-Mar-1961 DOA: 05/16/2017  PCP: [name], MD  Admit date: 05/16/2017 Discharge date: 05/18/2017  Admitted From:  Home  Disposition:  SNF  Recommendations for Outpatient Follow-up:  1. Follow up with PCP in 1-2 weeks 2. TSH in 6 weeks 3. Continue lower dose of levothyroxine 4. Please obtain BMP/CBC in one week   Home Health: No  Equipment/Devices: None  Discharge Condition: Stable CODE STATUS: Full Code Diet recommendation: Heart Healthy   Brief/Interim Summary: Jeremy Pile Wilsonis a 56 y.o.malewith a history of back pain, thyroid disease, thrombocytopenia, hepatitis C with hepatic cirrhosis. Patient seen due to syncopal episode that occurred earlier today. Patient was feeling otherwise well. He took the trash out around 7 AM and walked back towards the house. The next thing he remembered was waking up inside. His friend said that he did not come back for an hour and was found lying in the backyard. Patient tremulous but denies lightheadedness, dizziness, chest pain, shortness of breath, fevers, chills. Has never had a prior episode of syncope. He does report having a few episodes of palpitations over the past couple of weeks. No other palliating or provoking factors.  Emergency Department Course: CT head normal. CT abdomen pelvis without acute findings. No arrhythmias on telemetry.  Patient was admitted and placed on telemetry monitoring.  Telemetry showing sinus bradycardia.  He was seen by PT who recommended SNF.  SW assisted in placing patient in SNF.    Discharge Diagnoses:  Principal Problem:   Syncope Active Problems:   Thrombocytopenia (HCC)   Hepatic cirrhosis (HCC)   Hypothyroidism    Discharge Instructions  Discharge Instructions    Call MD for:  difficulty breathing, headache or visual disturbances   Complete by:  As directed    Call MD for:  extreme fatigue   Complete by:  As  directed    Call MD for:  hives   Complete by:  As directed    Call MD for:  persistant dizziness or light-headedness   Complete by:  As directed    Call MD for:  persistant nausea and vomiting   Complete by:  As directed    Call MD for:  redness, tenderness, or signs of infection (pain, swelling, redness, odor or green/yellow discharge around incision site)   Complete by:  As directed    Call MD for:  severe uncontrolled pain   Complete by:  As directed    Call MD for:  temperature >100.4   Complete by:  As directed    Diet - low sodium heart healthy   Complete by:  As directed    Discharge instructions   Complete by:  As directed    Recheck TSH in 6 weeks Heart monitor to be set up   Increase activity slowly   Complete by:  As directed      Allergies as of 05/18/2017      Reactions   Codeine Rash      Medication List    TAKE these medications   ALPRAZolam 1 MG tablet Commonly known as:  XANAX Take 1 tablet (1 mg total) 3 (three) times daily by mouth.   aspirin EC 81 MG tablet Take 81 mg by mouth daily.   levothyroxine 125 MCG tablet Commonly known as:  SYNTHROID, LEVOTHROID Take 1 tablet (125 mcg total) daily before breakfast by mouth. Start taking on:  05/19/2017 What changed:    medication strength  how much to  take   oxyCODONE-acetaminophen 10-325 MG tablet Commonly known as:  PERCOCET Take 1 tablet every 4 (four) hours as needed by mouth for pain.   traZODone 100 MG tablet Commonly known as:  DESYREL TAKE ONE TABLET BY MOUTH AT BEDTIME AS NEEDED.   VITAMIN B-12 PO Take 1 tablet by mouth daily.   VITAMIN C PO Take 1 tablet by mouth daily.      Follow-up Information    Jeremy Gross, NP. Schedule an appointment as soon as possible for a visit in 1 week(s).   Specialties:  Nurse Practitioner, Radiology, Cardiology Contact information: [address] [place] [address] [phone]        [name], MD. Schedule an appointment as  soon as possible for a visit in 1 week(s).   Specialty:  Internal Medicine Contact information: [address] [name] [address] [phone]          Allergies  Allergen Reactions  . Codeine Rash    Consultations:  Cardiology   Procedures/Studies: Ct Head Wo Contrast  Result Date: 05/16/2017 CLINICAL DATA:  56 year old male with TIA. EXAM: CT HEAD WITHOUT CONTRAST TECHNIQUE: Contiguous axial images were obtained from the base of the skull through the vertex without intravenous contrast. COMPARISON:  01/11/2012 FINDINGS: Brain: No evidence of acute infarction, hemorrhage, hydrocephalus, extra-axial collection or mass lesion/mass effect. Vascular: No hyperdense vessel or unexpected calcification. Skull: Normal. Negative for fracture or focal lesion. Sinuses/Orbits: No acute finding. Other: None. IMPRESSION: Unremarkable CT evaluation of the brain. Electronically Signed   By: [name] M.D.   On: 05/16/2017 14:51   Ct Abdomen Pelvis W Contrast  Result Date: 05/16/2017 CLINICAL DATA:  Pt reports went to take trash can out to the road this am. Pt reports "that's the last thing I remember". Pt denies drug use. Per EMS, pt roommates stated "loc" occurred this am around 7am and reports brought patient back in "to warm up". EMS called at 1200. EXAM: CT ABDOMEN AND PELVIS WITH CONTRAST TECHNIQUE: Multidetector CT imaging of the abdomen and pelvis was performed using the standard protocol following bolus administration of intravenous contrast. CONTRAST:  ISOVUE-300 IOPAMIDOL (ISOVUE-300) INJECTION 61% COMPARISON:  11/03/2016 FINDINGS: Lower chest: No acute abnormality. Hepatobiliary: Liver normal in size attenuation. There is some central volume loss and surface nodularity that suggests cirrhosis. No mass or focal lesion. Gallbladder surgically absent. Common bile duct is dilated with distal tapering, similar to the prior study. No duct stone. Pancreas: Unremarkable. No  pancreatic ductal dilatation or surrounding inflammatory changes. Spleen: Normal in size without focal abnormality. Adrenals/Urinary Tract: No adrenal masses. Two left renal cysts, largest from the anterior mid to upper pole measuring 4 cm. The other is from the lower pole measuring 13 mm. No other renal masses, no stones and no hydronephrosis. Ureters normal in course and in caliber. Bladder is unremarkable. Stomach/Bowel: Stomach is within normal limits. Appendix appears normal. No evidence of bowel wall thickening, distention, or inflammatory changes. Vascular/Lymphatic: Mild aortic atherosclerosis. No pathologically enlarged lymph nodes. Reproductive: Mild prominence of the prostate gland. Other: No abdominal wall hernia or abnormality. No abdominopelvic ascites. Musculoskeletal: Status post posterior fusion at L4, L5 and S1. Orthopedic hardware is well-seated and positioned and unchanged. No fracture or acute finding. No osteoblastic or osteolytic lesions. IMPRESSION: 1. No acute findings within the abdomen or pelvis. 2. Morphologic changes of the liver consistent with cirrhosis. 3. Chronic dilation of the common bile duct. Status post cholecystectomy. 4. Two left renal cysts,  stable. 5. Aortic atherosclerosis. Electronically Signed   By: Amie Portland M.D.   On: 05/16/2017 17:53   Dg Chest Port 1 View  Result Date: 05/16/2017 CLINICAL DATA:  56 year old male with syncope. EXAM: PORTABLE CHEST 1 VIEW COMPARISON:  01/18/2015 FINDINGS: The heart size and mediastinal contours are within normal limits. Both lungs are clear. The visualized skeletal structures are unremarkable. IMPRESSION: No active disease. Electronically Signed   By: [name] M.D.   On: 05/16/2017 15:11    Echocardiogram: Left ventricle: The cavity size was normal. Systolic function was   normal. The estimated ejection fraction was in the range of 55% to 60%. Wall motion was normal; there were no regional wall motion abnormalities.  Left ventricular diastolic function   parameters were normal. Aortic valve: Poorly visualized. Mitral valve: There was trivial regurgitation. Atrial septum: There was increased thickness of the septum, consistent with lipomatous hypertrophy. No defect or patent foramen ovale was identified   Subjective: Patient sitting in bed.  Voices he is glad to be going to a skilled nursing facility at discharge for short term rehab.  Mentions that his strength is poor.  Denies any chest pain or palpitations.  Still feels weak.  Discharge Exam: Vitals:   05/18/17 0748 05/18/17 0952  BP: 131/84 121/87  Pulse: 62 (!) 59  Resp: 17 16  Temp: 97.9 F (36.6 C) 97.7 F (36.5 C)  SpO2: 97% 98%   Vitals:   05/17/17 2110 05/18/17 0551 05/18/17 0748 05/18/17 0952  BP: 105/62 112/76 131/84 121/87  Pulse: (!) 58 70 62 (!) 59  Resp: 18 18 17 16   Temp: 97.8 F (36.6 C) 98 F (36.7 C) 97.9 F (36.6 C) 97.7 F (36.5 C)  TempSrc: Oral Oral Oral Oral  SpO2: 95% 96% 97% 98%  Weight:  91.6 kg (201 lb 15.1 oz)    Height:        General: Pt is alert, awake, not in acute distress Cardiovascular: RRR, S1/S2 +, no rubs, no gallops Respiratory: CTA bilaterally, no wheezing, no rhonchi Abdominal: Soft, NT, ND, bowel sounds + Extremities: no edema, no cyanosis    The results of significant diagnostics from this hospitalization (including imaging, microbiology, ancillary and laboratory) are listed below for reference.     Microbiology: Recent Results (from the past 240 hour(s))  Urine Culture     Status: Abnormal   Collection Time: 05/16/17 12:34 PM  Result Value Ref Range Status   Specimen Description URINE, CLEAN CATCH  Final   Special Requests NONE  Final   Culture (A)  Final    <10,000 COLONIES/mL INSIGNIFICANT GROWTH Performed at [facility], [address] [place], [place] [address]    Report Status 05/18/2017 FINAL  Final  MRSA PCR Screening     Status: Abnormal   Collection Time:  05/17/17  6:28 AM  Result Value Ref Range Status   MRSA by PCR POSITIVE (A) NEGATIVE Final    Comment:        The GeneXpert MRSA Assay (FDA approved for NASAL specimens only), is one component of a comprehensive MRSA colonization surveillance program. It is not intended to diagnose MRSA infection nor to guide or monitor treatment for MRSA infections. RESULT CALLED TO, READ BACK BY AND VERIFIED WITH: HAWKINS,M AT 1022 ON 05/17/2017 BY MOSLEY,J      Labs: BNP (last 3 results) No results for input(s): BNP in the last 8760 hours. Basic Metabolic Panel: Recent Labs  Lab 05/16/17 1242 05/17/17 0631  NA 134* 136  K 3.4* 3.9  CL 97* 99*  CO2 28 26  GLUCOSE 111* 90  BUN 17 11  CREATININE 1.03 0.95  CALCIUM 10.1 9.7   Liver Function Tests: Recent Labs  Lab 05/16/17 1242  AST 21  ALT 16*  ALKPHOS 68  BILITOT 1.3*  PROT 8.9*  ALBUMIN 5.1*   No results for input(s): LIPASE, AMYLASE in the last 168 hours. No results for input(s): AMMONIA in the last 168 hours. CBC: Recent Labs  Lab 05/16/17 1242  WBC 9.4  NEUTROABS 6.9  HGB 16.9  HCT 48.7  MCV 92.1  PLT 110*   Cardiac Enzymes: Recent Labs  Lab 05/16/17 1242  TROPONINI <0.03   BNP: Invalid input(s): POCBNP CBG: Recent Labs  Lab 05/16/17 1253 05/17/17 0530 05/18/17 0548  GLUCAP 116* 100* 93   D-Dimer No results for input(s): DDIMER in the last 72 hours. Hgb A1c No results for input(s): HGBA1C in the last 72 hours. Lipid Profile No results for input(s): CHOL, HDL, LDLCALC, TRIG, CHOLHDL, LDLDIRECT in the last 72 hours. Thyroid function studies Recent Labs    05/17/17 0631  TSH 0.030*   Anemia work up No results for input(s): VITAMINB12, FOLATE, FERRITIN, TIBC, IRON, RETICCTPCT in the last 72 hours. Urinalysis    Component Value Date/Time   COLORURINE AMBER (A) 05/16/2017 1226   APPEARANCEUR HAZY (A) 05/16/2017 1226   LABSPEC 1.031 (H) 05/16/2017 1226   PHURINE 5.0 05/16/2017 1226    GLUCOSEU NEGATIVE 05/16/2017 1226   HGBUR NEGATIVE 05/16/2017 1226   BILIRUBINUR NEGATIVE 05/16/2017 1226   KETONESUR NEGATIVE 05/16/2017 1226   PROTEINUR 100 (A) 05/16/2017 1226   UROBILINOGEN 4.0 (H) 01/18/2015 1557   NITRITE NEGATIVE 05/16/2017 1226   LEUKOCYTESUR NEGATIVE 05/16/2017 1226   Sepsis Labs Invalid input(s): PROCALCITONIN,  WBC,  LACTICIDVEN Microbiology Recent Results (from the past 240 hour(s))  Urine Culture     Status: Abnormal   Collection Time: 05/16/17 12:34 PM  Result Value Ref Range Status   Specimen Description URINE, CLEAN CATCH  Final   Special Requests NONE  Final   Culture (A)  Final    <10,000 COLONIES/mL INSIGNIFICANT GROWTH Performed at [facility], [address] [place], [place] [address]    Report Status 05/18/2017 FINAL  Final  MRSA PCR Screening     Status: Abnormal   Collection Time: 05/17/17  6:28 AM  Result Value Ref Range Status   MRSA by PCR POSITIVE (A) NEGATIVE Final    Comment:        The GeneXpert MRSA Assay (FDA approved for NASAL specimens only), is one component of a comprehensive MRSA colonization surveillance program. It is not intended to diagnose MRSA infection nor to guide or monitor treatment for MRSA infections. RESULT CALLED TO, READ BACK BY AND VERIFIED WITH: HAWKINS,M AT 1022 ON 05/17/2017 BY MOSLEY,J      Time coordinating discharge: 35 minutes  SIGNED:   Katrinka BlazingAlex U Kadolph, MD  [facility] 05/18/2017, 2:53 PM Pager [phone] If 7PM-7AM, please contact night-coverage [email or web address] Password [email or web address]

## 2017-05-18 NOTE — Care Management Note (Signed)
Case Management Note  Patient Details  Name: Jeremy Weeks MRN: [id number] Date of Birth: 07/06/1961  Subjective/Objective:  Patient adm after syncopal episode. From home, lives with a friend. Walks with a cane. No home health pta. Worked with PT, recommended for SNF. Patient agreeable, would also like to discuss with sister. CSW notified.                Action/Plan: Anticipate DC to SNF. Will follow. Potential DC today per attending,    Expected Discharge Date:  05/18/17               Expected Discharge Plan:  Skilled Nursing Facility  In-House Referral:  Clinical Social Work  Discharge planning Services  CM Consult  Post Acute Care Choice:    Choice offered to:     DME Arranged:    DME Agency:     HH Arranged:    HH Agency:     Status of Service:  In process, will continue to follow  If discussed at Long Length of Stay Meetings, dates discussed:    Additional Comments:  Jeremy Weeks, Jeremy OilerSharley Diane, RN 05/18/2017, 11:15 AM

## 2017-05-18 NOTE — Clinical Social Work Placement (Signed)
   CLINICAL SOCIAL WORK PLACEMENT  NOTE  Date:  05/18/2017  Patient Details  Name: Jeremy Weeks MRN: [id number] Date of Birth: 11/25/1960  Clinical Social Work is seeking post-discharge placement for this patient at the Skilled  Nursing Facility level of care (*CSW will initial, date and re-position this form in  chart as items are completed):  Yes   Patient/family provided with [facility] Clinical Social Work Department's list of facilities offering this level of care within the geographic area requested by the patient (or if unable, by the patient's family).  Yes   Patient/family informed of their freedom to choose among providers that offer the needed level of care, that participate in Medicare, Medicaid or managed care program needed by the patient, have an available bed and are willing to accept the patient.  Yes   Patient/family informed of [facility]'s ownership interest in [facility] and [facility], as well as of the fact that they are under no obligation to receive care at these facilities.  PASRR submitted to EDS on 05/18/17     PASRR number received on 05/18/17     Existing PASRR number confirmed on       FL2 transmitted to all facilities in geographic area requested by pt/family on 05/18/17     FL2 transmitted to all facilities within larger geographic area on       Patient informed that his/her managed care company has contracts with or will negotiate with certain facilities, including the following:        Yes   Patient/family informed of bed offers received.  [facility]     Physician recommends and patient chooses bed at      [facility] 05[facility]18[facility].  Patient to be transferred to facility by sister, Marlowe SaxDiane Ragland.     Patient family notified on 05/18/17 of transfer.  Name of family member notified:  Diane Ragland     PHYSICIAN       Additional Comment:  Facility notified and  discharge clinicals sent via [employer]. LCSW signing off.   _______________________________________________ Annice NeedySettle, Alean Kromer D, LCSW 05/18/2017, 3:11 PM

## 2017-05-18 NOTE — Consult Note (Signed)
Cardiology Consultation:   Patient ID: Jeremy Weeks; [id number]; Feb 16, 1961   Admit date: 05/16/2017 Date of Consult: 05/18/2017  Primary Care Provider: [name], MD Consulting Cardiologist: Dr. Jonelle Sidle   Patient Profile:   Jeremy Weeks is a 56 y.o. male with a history of hepatitis C with cirrhosis, hypothyroidism on Synthroid, and chronic pain who is being seen today for the evaluation of syncope at the request of Dr. Rinaldo Ratel.  History of Present Illness:   Jeremy Weeks is currently admitted to the hospital after an episode of apparent syncope.  He states that he pushed his trash can out to the street, denies any chest pain or unusual breathlessness at that time, but states that this is a difficult chore for him due to chronic back and leg pain.  He turned to face his house before he walked back, and states the next thing that he remembers he was being awakened by family members inside his house.  He does not recall having any palpitations but does remember feeling lightheaded before this happened.  He states that he has not had any previous episodes of syncope, sometimes feels lightheaded.  He was admitted for further observation, has not ambulated as yet.  Orthostatics were negative.  On telemetry monitoring he has had sinus rhythm with some bradycardia in the 40s, but not associated with specific symptoms, no pauses identified.  Troponin I level negative for ACS.  ECG shows sinus rhythm with normal intervals, decreased anteroseptal R wave progression.  Echocardiogram reveals LVEF 55-60% without wall motion abnormalities or major valvular abnormalities. Head CT was negative for acute findings.  Past Medical History:  Diagnosis Date  . Chronic pain   . Hepatic cirrhosis (HCC) 08/28/2016  . Hepatitis C   . Hypothyroidism   . Thrombocytopenia (HCC) 01/22/2015    Past Surgical History:  Procedure Laterality Date  . BACK SURGERY    . CHOLECYSTECTOMY       Inpatient  Medications: Scheduled Meds: . ALPRAZolam  1 mg Oral TID  . aspirin EC  81 mg Oral Daily  . Chlorhexidine Gluconate Cloth  6 each Topical Q0600  . enoxaparin (LOVENOX) injection  40 mg Subcutaneous Q24H  . feeding supplement (ENSURE ENLIVE)  237 mL Oral BID BM  . levothyroxine  125 mcg Oral QAC breakfast  . mupirocin ointment  1 application Nasal BID  . sodium chloride flush  3 mL Intravenous Q12H    PRN Meds: ondansetron **OR** ondansetron (ZOFRAN) IV, oxyCODONE-acetaminophen **AND** oxyCODONE, traZODone  Allergies:    Allergies  Allergen Reactions  . Codeine Rash    Social History:   Social History   Socioeconomic History  . Marital status: Single    Spouse name: Not on file  . Number of children: Not on file  . Years of education: Not on file  . Highest education level: Not on file  Social Needs  . Financial resource strain: Not on file  . Food insecurity - worry: Not on file  . Food insecurity - inability: Not on file  . Transportation needs - medical: Not on file  . Transportation needs - non-medical: Not on file  Occupational History  . Not on file  Tobacco Use  . Smoking status: Current Every Day Smoker    Packs/day: 2.00    Years: 37.00    Pack years: 74.00    Types: Cigarettes  . Smokeless tobacco: Never Used  Substance and Sexual Activity  . Alcohol use: No  Alcohol/week: 0.0 oz    Comment: quit drinking 26 yrs  . Drug use: No  . Sexual activity: Yes    Birth control/protection: None  Other Topics Concern  . Not on file  Social History Narrative  . Not on file    Family History:   The patient's family history includes Cancer in his mother.  ROS:  Please see the history of present illness.  Chronic back pain.  All other ROS reviewed and negative.     Physical Exam/Data:   Vitals:   05/17/17 1700 05/17/17 2110 05/18/17 0551 05/18/17 0748  BP: 114/68 105/62 112/76 131/84  Pulse: (!) 59 (!) 58 70 62  Resp: 16 18 18 17   Temp: 97.8 F (36.6  C) 97.8 F (36.6 C) 98 F (36.7 C) 97.9 F (36.6 C)  TempSrc: Oral Oral Oral Oral  SpO2: 97% 95% 96% 97%  Weight:   201 lb 15.1 oz (91.6 kg)   Height:        Intake/Output Summary (Last 24 hours) at 05/18/2017 0903 Last data filed at 05/17/2017 1900 Gross per 24 hour  Intake 240 ml  Output 100 ml  Net 140 ml   Filed Weights   05/16/17 2020 05/17/17 0534 05/18/17 0551  Weight: 199 lb 14.4 oz (90.7 kg) 198 lb 14.4 oz (90.2 kg) 201 lb 15.1 oz (91.6 kg)   Body mass index is 28.17 kg/m.   Gen: Patient appears comfortable at rest. HEENT: Bearded male.  Conjunctiva and lids normal, oropharynx clear with poor dentition. Neck: Supple, no elevated JVP or carotid bruits, no thyromegaly. Lungs: Clear to auscultation, nonlabored breathing at rest. Cardiac: Regular rate and rhythm, no S3 or significant systolic murmur, no pericardial rub. Abdomen: Soft, nontender, bowel sounds present, no guarding or rebound. Extremities: No pitting edema, distal pulses 2+. Skin: Warm and dry. Musculoskeletal: No kyphosis. Neuropsychiatric: Alert and oriented x3, affect grossly appropriate.  EKG:  I personally reviewed the tracing from 05/16/2017 which showed sinus rhythm with decreased anteroseptal R wave progression.  Telemetry:  I personally reviewed telemetry which shows sinus rhythm and sinus bradycardia, no pauses or sustained arrhythmias.  Relevant CV Studies:  Echocardiogram 05/17/2017: Study Conclusions  - Left ventricle: The cavity size was normal. Systolic function was   normal. The estimated ejection fraction was in the range of 55%   to 60%. Wall motion was normal; there were no regional wall   motion abnormalities. Left ventricular diastolic function   parameters were normal. - Aortic valve: Poorly visualized. - Mitral valve: There was trivial regurgitation. - Atrial septum: There was increased thickness of the septum,   consistent with lipomatous hypertrophy. No defect or patent    foramen ovale was identified.  Laboratory Data:  Chemistry Recent Labs  Lab 05/16/17 1242 05/17/17 0631  NA 134* 136  K 3.4* 3.9  CL 97* 99*  CO2 28 26  GLUCOSE 111* 90  BUN 17 11  CREATININE 1.03 0.95  CALCIUM 10.1 9.7  GFRNONAA >60 >60  GFRAA >60 >60  ANIONGAP 9 11    Recent Labs  Lab 05/16/17 1242  PROT 8.9*  ALBUMIN 5.1*  AST 21  ALT 16*  ALKPHOS 68  BILITOT 1.3*   Hematology Recent Labs  Lab 05/16/17 1242  WBC 9.4  RBC 5.29  HGB 16.9  HCT 48.7  MCV 92.1  MCH 31.9  MCHC 34.7  RDW 13.0  PLT 110*   Cardiac Enzymes Recent Labs  Lab 05/16/17 1242  TROPONINI <0.03  No results for input(s): TROPIPOC in the last 168 hours.   Radiology/Studies:  Ct Head Wo Contrast  Result Date: 05/16/2017 CLINICAL DATA:  56 year old male with TIA. EXAM: CT HEAD WITHOUT CONTRAST TECHNIQUE: Contiguous axial images were obtained from the base of the skull through the vertex without intravenous contrast. COMPARISON:  01/11/2012 FINDINGS: Brain: No evidence of acute infarction, hemorrhage, hydrocephalus, extra-axial collection or mass lesion/mass effect. Vascular: No hyperdense vessel or unexpected calcification. Skull: Normal. Negative for fracture or focal lesion. Sinuses/Orbits: No acute finding. Other: None. IMPRESSION: Unremarkable CT evaluation of the brain. Electronically Signed   By: [name] M.D.   On: 05/16/2017 14:51   Ct Abdomen Pelvis W Contrast  Result Date: 05/16/2017 CLINICAL DATA:  Pt reports went to take trash can out to the road this am. Pt reports "that's the last thing I remember". Pt denies drug use. Per EMS, pt roommates stated "loc" occurred this am around 7am and reports brought patient back in "to warm up". EMS called at 1200. EXAM: CT ABDOMEN AND PELVIS WITH CONTRAST TECHNIQUE: Multidetector CT imaging of the abdomen and pelvis was performed using the standard protocol following bolus administration of intravenous contrast. CONTRAST:   ISOVUE-300 IOPAMIDOL (ISOVUE-300) INJECTION 61% COMPARISON:  11/03/2016 FINDINGS: Lower chest: No acute abnormality. Hepatobiliary: Liver normal in size attenuation. There is some central volume loss and surface nodularity that suggests cirrhosis. No mass or focal lesion. Gallbladder surgically absent. Common bile duct is dilated with distal tapering, similar to the prior study. No duct stone. Pancreas: Unremarkable. No pancreatic ductal dilatation or surrounding inflammatory changes. Spleen: Normal in size without focal abnormality. Adrenals/Urinary Tract: No adrenal masses. Two left renal cysts, largest from the anterior mid to upper pole measuring 4 cm. The other is from the lower pole measuring 13 mm. No other renal masses, no stones and no hydronephrosis. Ureters normal in course and in caliber. Bladder is unremarkable. Stomach/Bowel: Stomach is within normal limits. Appendix appears normal. No evidence of bowel wall thickening, distention, or inflammatory changes. Vascular/Lymphatic: Mild aortic atherosclerosis. No pathologically enlarged lymph nodes. Reproductive: Mild prominence of the prostate gland. Other: No abdominal wall hernia or abnormality. No abdominopelvic ascites. Musculoskeletal: Status post posterior fusion at L4, L5 and S1. Orthopedic hardware is well-seated and positioned and unchanged. No fracture or acute finding. No osteoblastic or osteolytic lesions. IMPRESSION: 1. No acute findings within the abdomen or pelvis. 2. Morphologic changes of the liver consistent with cirrhosis. 3. Chronic dilation of the common bile duct. Status post cholecystectomy. 4. Two left renal cysts, stable. 5. Aortic atherosclerosis. Electronically Signed   By: Amie Portland M.D.   On: 05/16/2017 17:53   Dg Chest Port 1 View  Result Date: 05/16/2017 CLINICAL DATA:  56 year old male with syncope. EXAM: PORTABLE CHEST 1 VIEW COMPARISON:  01/18/2015 FINDINGS: The heart size and mediastinal contours are within normal  limits. Both lungs are clear. The visualized skeletal structures are unremarkable. IMPRESSION: No active disease. Electronically Signed   By: [name] M.D.   On: 05/16/2017 15:11    Assessment and Plan:   1.  Episode of apparent syncope, etiology is not clear.  He describes a feeling of lightheadedness prior to the event, no chest pain or palpitations.  He denies any previous syncopal events.  Orthostatics showed no significant blood pressure change.  He does have sinus bradycardia but no pauses or other sustained arrhythmias, and not specifically symptomatic while bradycardic.  Troponin I level negative for ACS.  ECG shows normal  intervals.  There are decreased anteroseptal R waves but echocardiogram shows normal LVEF at 55-60% without wall motion and normalities.  2.  Thyroidism, on Synthroid.  TSH 0.030.  May need dose adjustments, defer to primary team and PCP.  3.  History of hepatitis C and cirrhosis.  4.  Chronic pain.  At this point would recommend having the patient ambulate in the halls on telemetry to assess heart rate and symptoms.  No further inpatient cardiac testing is anticipated at this point in light of overall reassuring workup so far.  Consider rechecking orthostatic vital signs.  Would also recommend an outpatient event recorder if no other etiologies are defined.  30-day event recorder can be scheduled through our office (please call [phone]) and the patient can have a follow-up visit with APP for review.   Signed, Nona DellSamuel McDowell, MD  05/18/2017 9:03 AM

## 2017-05-18 NOTE — NC FL2 (Signed)
[place] MEDICAID FL2 LEVEL OF CARE SCREENING TOOL     IDENTIFICATION  Patient Name: Jeremy Weeks Birthdate: 12/18/1960 Sex: male Admission Date (Current Location): 05/16/2017  [place]:  [name] [id number] P Facility and Address:  [facility],  [address], [place] [id number]      Provider Number: [id number]  Attending Physician Name and Address:  Filbert SchilderKadolph, Alexandria U, MD  Relative Name and Phone Number:       Current Level of Care: Other (Comment)(Observation) Recommended Level of Care: Skilled Nursing Facility Prior Approval Number:    Date Approved/Denied:   PASRR Number: [id number]  Discharge Plan: SNF    Current Diagnoses: Patient Active Problem List   Diagnosis Date Noted  . Syncope 05/16/2017  . Hypothyroidism 05/16/2017  . Hepatic cirrhosis (HCC) 08/28/2016  . Hepatitis C 04/05/2015  . Thrombocytopenia (HCC) 01/22/2015    Orientation RESPIRATION BLADDER Height & Weight     Self, Time, Situation, Place  Normal Continent Weight: 201 lb 15.1 oz (91.6 kg) Height:  5\' 11"  (180.3 cm)  BEHAVIORAL SYMPTOMS/MOOD NEUROLOGICAL BOWEL NUTRITION STATUS      Continent Diet(Regular)  AMBULATORY STATUS COMMUNICATION OF NEEDS Skin   Limited Assist Verbally Normal                       Personal Care Assistance Level of Assistance  Feeding, Dressing, Bathing   Feeding assistance: Independent Dressing Assistance: Limited assistance     Functional Limitations Info  Sight, Hearing, Speech Sight Info: Adequate Hearing Info: Adequate Speech Info: Adequate    SPECIAL CARE FACTORS FREQUENCY  PT (By licensed PT)     PT Frequency: 5x/week              Contractures Contractures Info: Not present    Additional Factors Info  Code Status, Isolation Precautions Code Status Info: Full Code       Isolation Precautions Info: 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 ALPRAZolam Prudy Feeler(XANAX) tablet 1 mg  1 mg Oral TID Levie HeritageStinson, Jacob J, DO   1 mg at 05/18/17 [id number]  . aspirin EC tablet 81 mg  81 mg Oral Daily Levie HeritageStinson, Jacob J, DO   81 mg at 05/18/17 [id number]  . Chlorhexidine Gluconate Cloth 2 % PADS 6 each  6 each Topical Q0600 Filbert SchilderKadolph, Alexandria U, MD   6 each at 05/17/17 1109  . enoxaparin (LOVENOX) injection 40 mg  40 mg Subcutaneous Q24H Levie HeritageStinson, Jacob J, DO   40 mg at 05/17/17 2028  . feeding supplement (ENSURE ENLIVE) (ENSURE ENLIVE) liquid 237 mL  237 mL Oral BID BM Levie HeritageStinson, Jacob J, DO   237 mL at 05/18/17 1141  . levothyroxine (SYNTHROID, LEVOTHROID) tablet 125 mcg  125 mcg Oral QAC breakfast Filbert SchilderKadolph, Alexandria U, MD   125 mcg at 05/18/17 [phone]  . mupirocin ointment (BACTROBAN) 2 % 1 application  1 application Nasal BID Filbert SchilderKadolph, Alexandria U, MD   1 application at 05/18/17 [phone]  . ondansetron (ZOFRAN) tablet 4 mg  4 mg Oral Q6H PRN Levie HeritageStinson, Jacob J, DO       Or  . ondansetron [facility](ZOFRAN) injection 4 mg  4 mg Intravenous Q6H PRN Levie HeritageStinson, Jacob J, DO   4 mg at 05/18/17 1141  . oxyCODONE (Oxy IR/ROXICODONE) immediate release tablet 5 mg  5 mg Oral Q4H PRN Filbert Schilder, MD       And  . oxyCODONE-acetaminophen (PERCOCET/ROXICET) 5-325 MG per tablet 1 tablet  1 tablet Oral Q4H PRN Filbert Schilder, MD      . sodium chloride flush (NS) 0.9 % injection 3 mL  3 mL Intravenous Q12H Levie Heritage, DO   3 mL at 05/17/17 2144  . traZODone (DESYREL) tablet 100 mg  100 mg Oral QHS PRN Levie Heritage, DO   100 mg at 05/16/17 2226     Discharge Medications: Please see discharge summary for a list of discharge medications.  Relevant Imaging Results:  Relevant Lab Results:   Additional Information SSN [id number] Juleen China, LCSW

## 2017-05-18 NOTE — Evaluation (Signed)
Physical Therapy Evaluation Patient Details Name: Jeremy Weeks MRN: [id number] DOB: 15-Jul-1960 Today's Date: 05/18/2017   History of Present Illness  Jeremy Weeks is a 56 y.o. male with a history of back pain, thyroid disease, thrombocytopenia, hepatitis C with hepatic cirrhosis.  Patient seen due to syncopal episode that occurred earlier today.  Patient was feeling otherwise well.  He took the trash out around 7 AM and walked back towards the house.  The next thing he remembered was waking up inside.  His friend said that he did not come back for an hour and was found lying in the backyard.  Patient tremulous but denies lightheadedness, dizziness, chest pain, shortness of breath, fevers, chills.  Has never had a prior episode of syncope.  He does report having a few episodes of palpitations over the past couple of weeks.  No other palliating or provoking factors.    Clinical Impression  Patient demonstrates slow labored movement for sitting up at bedside and during gait training with a couple of episodes with near loss of balance due to knees buckling and weakness.  Patient limited for out of bed due to c/o light headedness when sitting up/standing.  Patient will benefit from continued physical therapy in hospital and recommended venue below to increase strength, balance, endurance for safe ADLs and gait.    Follow Up Recommendations SNF;Supervision/Assistance - 24 hour    Equipment Recommendations  None recommended by PT    Recommendations for Other Services       Precautions / Restrictions Precautions Precautions: Fall Restrictions Weight Bearing Restrictions: No      Mobility  Bed Mobility Overal bed mobility: Needs Assistance Bed Mobility: Supine to Sit;Sit to Supine;Rolling Rolling: Supervision   Supine to sit: Supervision Sit to supine: Supervision   General bed mobility comments: had to use bed rail  Transfers Overall transfer level: Needs assistance Equipment  used: Straight cane Transfers: Sit to/from Stand Sit to Stand: Min guard            Ambulation/Gait Ambulation/Gait assistance: Min guard Ambulation Distance (Feet): 30 Feet Assistive device: Straight cane Gait Pattern/deviations: Decreased step length - right;Decreased step length - left;Decreased stance time - left   Gait velocity interpretation: Below normal speed for age/gender General Gait Details: Patient demonstrated slow labored cadence with occasional buckling of knees due to weakness, 2 episodes of near loss of balance and patient able to self correct using SPC, limited secondary to c/o light headedness and fatigue  Stairs            Wheelchair Mobility    Modified Rankin (Stroke Patients Only)       Balance Overall balance assessment: Needs assistance Sitting-balance support: Feet supported;No upper extremity supported Sitting balance-Leahy Scale: Good     Standing balance support: Single extremity supported;During functional activity Standing balance-Leahy Scale: Fair                               Pertinent Vitals/Pain Pain Assessment: 0-10 Pain Score: 8  Pain Location: chronic low back pain Pain Descriptors / Indicators: Aching Pain Intervention(s): Limited activity within patient's tolerance;Monitored during session    Home Living Family/patient expects to be discharged to:: Private residence Living Arrangements: Non-relatives/Friends Available Help at Discharge: Friend(s) Type of Home: House Home Access: Stairs to enter Entrance Stairs-Rails: Right;Left;Can reach both Entrance Stairs-Number of Steps: 5 Home Layout: One level Home Equipment: Environmental consultant - 2 wheels;Wheelchair - [employer];Shower  seat      Prior Function Level of Independence: Independent with assistive device(s)               Hand Dominance        Extremity/Trunk Assessment   Upper Extremity Assessment Upper Extremity Assessment: Generalized  weakness    Lower Extremity Assessment Lower Extremity Assessment: Generalized weakness    Cervical / Trunk Assessment Cervical / Trunk Assessment: Normal  Communication   Communication: No difficulties  Cognition Arousal/Alertness: Awake/alert Behavior During Therapy: WFL for tasks assessed/performed Overall Cognitive Status: Within Functional Limits for tasks assessed                                        General Comments      Exercises     Assessment/Plan    PT Assessment Patient needs continued PT services  PT Problem List Decreased strength;Decreased activity tolerance;Decreased balance;Decreased mobility       PT Treatment Interventions Gait training;Stair training;Functional mobility training;Therapeutic activities;Patient/family education;Therapeutic exercise    PT Goals (Current goals can be found in the Care Plan section)  Acute Rehab PT Goals Patient Stated Goal: return home after rehab PT Goal Formulation: With patient Time For Goal Achievement: 05/22/17 Potential to Achieve Goals: Good    Frequency Min 3X/week   Barriers to discharge        Co-evaluation               AM-PAC PT "6 Clicks" Daily Activity  Outcome Measure Difficulty turning over in bed (including adjusting bedclothes, sheets and blankets)?: None Difficulty moving from lying on back to sitting on the side of the bed? : None Difficulty sitting down on and standing up from a chair with arms (e.g., wheelchair, bedside commode, etc,.)?: A Little Help needed moving to and from a bed to chair (including a wheelchair)?: A Little Help needed walking in hospital room?: A Little Help needed climbing 3-5 steps with a railing? : A Little 6 Click Score: 20    End of Session Equipment Utilized During Treatment: Gait belt Activity Tolerance: Patient limited by fatigue;Patient limited by pain Patient left: in bed;with call bell/phone within reach Nurse Communication:  Mobility status PT Visit Diagnosis: Unsteadiness on feet (R26.81);Other abnormalities of gait and mobility (R26.89);Muscle weakness (generalized) (M62.81)    Time: [id number] PT Time Calculation (min) (ACUTE ONLY): 26 min   Charges:   PT Evaluation $PT Eval Moderate Complexity: 1 Mod PT Treatments $Therapeutic Activity: 8-22 mins   PT G Codes:   PT G-Codes **NOT FOR INPATIENT CLASS** Functional Assessment Tool Used: AM-PAC 6 Clicks Basic Mobility Functional Limitation: Mobility: Walking and moving around Mobility: Walking and Moving Around Current Status ([id number](G8978): At least 20 percent but less than 40 percent impaired, limited or restricted Mobility: Walking and Moving Around Goal Status [phone](G8979): At least 20 percent but less than 40 percent impaired, limited or restricted Mobility: Walking and Moving Around Discharge Status [phone](G8980): At least 20 percent but less than 40 percent impaired, limited or restricted    11:58 AM, 05/18/17 Ocie BobJames Maeghan Canny, MPT Physical Therapist with [facility] [phone] office [phone] mobile phone

## 2017-05-18 NOTE — Clinical Social Work Note (Signed)
Clinical Social Work Assessment  Patient Details  Name: Jeremy Weeks MRN: [id number] Date of Birth: 01/20/1961  Date of referral:  05/18/17               Reason for consult:  Facility Placement                Permission sought to share information with:    Permission granted to share information::     Name::        Agency::     Relationship::     Contact Information:     Housing/Transportation Living arrangements for the past 2 months:  Apartment Source of Information:  Patient Patient Interpreter Needed:  None Criminal Activity/Legal Involvement Pertinent to Current Situation/Hospitalization:  No - Comment as needed Significant Relationships:  Friend Lives with:  Roommate Do you feel safe going back to the place where you live?  Yes Need for family participation in patient care:  Yes (Comment)  Care giving concerns:  None identified at baseline.    Social Worker assessment / plan:  Patient lives with a friend whose wife recently died. At baseline patient ambulates with a cane, is independent in his ADLs and drives. He is agreeable to short term SNF for PT rehab.   Employment status:  Disabled (Comment on whether or not currently receiving Disability) Insurance information:  Teacher, English as a foreign languageManaged Medicare, Medicaid [place] Recommendations:  Skilled Nursing Facility Information / Referral to community resources:  Skilled Nursing Facility  Patient/Family's Response to care:  Patient is agreeable to SNF.   Patient/Family's Understanding of and Emotional Response to Diagnosis, Current Treatment, and Prognosis:  Patient understands his diagnosis, treatment and prognosis based on this knowledge he is agreeable to SNF.   Emotional Assessment Appearance:  Appears stated age Attitude/Demeanor/Rapport:    Affect (typically observed):  Accepting Orientation:  Oriented to Self, Oriented to Place, Oriented to  Time, Oriented to Situation Alcohol / Substance use:  Not Applicable Psych  involvement (Current and /or in the community):  No (Comment)  Discharge Needs  Concerns to be addressed:  Discharge Planning Concerns Readmission within the last 30 days:  No Current discharge risk:  None Barriers to Discharge:  No Barriers Identified   Annice NeedySettle, India Jolin D, LCSW 05/18/2017, 3:00 PM

## 2017-05-19 DIAGNOSIS — E039 Hypothyroidism, unspecified: Secondary | ICD-10-CM | POA: Diagnosis not present

## 2017-05-19 DIAGNOSIS — K7460 Unspecified cirrhosis of liver: Secondary | ICD-10-CM | POA: Diagnosis not present

## 2017-05-19 DIAGNOSIS — R55 Syncope and collapse: Secondary | ICD-10-CM | POA: Diagnosis not present

## 2017-05-20 ENCOUNTER — Telehealth: Payer: Self-pay | Admitting: *Deleted

## 2017-05-20 NOTE — Telephone Encounter (Signed)
30 Day event monitor mailed to [facility] in [place], [place] @ [place] [id number]. P# [phone]. Spoke with nurse Rosa-Lee who voiced understanding of monitor.

## 2017-05-25 DIAGNOSIS — D518 Other vitamin B12 deficiency anemias: Secondary | ICD-10-CM | POA: Diagnosis not present

## 2017-05-25 DIAGNOSIS — E038 Other specified hypothyroidism: Secondary | ICD-10-CM | POA: Diagnosis not present

## 2017-05-25 DIAGNOSIS — Z79899 Other long term (current) drug therapy: Secondary | ICD-10-CM | POA: Diagnosis not present

## 2017-05-25 DIAGNOSIS — E119 Type 2 diabetes mellitus without complications: Secondary | ICD-10-CM | POA: Diagnosis not present

## 2017-05-25 DIAGNOSIS — E7849 Other hyperlipidemia: Secondary | ICD-10-CM | POA: Diagnosis not present

## 2017-05-26 DIAGNOSIS — E039 Hypothyroidism, unspecified: Secondary | ICD-10-CM | POA: Diagnosis not present

## 2017-05-26 DIAGNOSIS — R55 Syncope and collapse: Secondary | ICD-10-CM | POA: Diagnosis not present

## 2017-05-26 DIAGNOSIS — K7460 Unspecified cirrhosis of liver: Secondary | ICD-10-CM | POA: Diagnosis not present

## 2017-06-04 DIAGNOSIS — R55 Syncope and collapse: Secondary | ICD-10-CM | POA: Diagnosis not present

## 2017-06-05 ENCOUNTER — Ambulatory Visit (INDEPENDENT_AMBULATORY_CARE_PROVIDER_SITE_OTHER): Payer: Medicare Other

## 2017-06-05 DIAGNOSIS — R55 Syncope and collapse: Secondary | ICD-10-CM | POA: Diagnosis not present

## 2017-06-09 DIAGNOSIS — D696 Thrombocytopenia, unspecified: Secondary | ICD-10-CM | POA: Diagnosis not present

## 2017-06-09 DIAGNOSIS — G894 Chronic pain syndrome: Secondary | ICD-10-CM | POA: Diagnosis not present

## 2017-06-09 DIAGNOSIS — K7460 Unspecified cirrhosis of liver: Secondary | ICD-10-CM | POA: Diagnosis not present

## 2017-06-22 ENCOUNTER — Ambulatory Visit: Payer: Medicare Other | Admitting: Cardiology

## 2017-07-29 DIAGNOSIS — E748 Other specified disorders of carbohydrate metabolism: Secondary | ICD-10-CM | POA: Diagnosis not present

## 2017-07-29 DIAGNOSIS — E782 Mixed hyperlipidemia: Secondary | ICD-10-CM | POA: Diagnosis not present

## 2017-07-29 DIAGNOSIS — I10 Essential (primary) hypertension: Secondary | ICD-10-CM | POA: Diagnosis not present

## 2017-07-29 DIAGNOSIS — Z1389 Encounter for screening for other disorder: Secondary | ICD-10-CM | POA: Diagnosis not present

## 2017-07-29 DIAGNOSIS — M2550 Pain in unspecified joint: Secondary | ICD-10-CM | POA: Diagnosis not present

## 2017-07-29 DIAGNOSIS — K7460 Unspecified cirrhosis of liver: Secondary | ICD-10-CM | POA: Diagnosis not present

## 2017-07-29 DIAGNOSIS — R7309 Other abnormal glucose: Secondary | ICD-10-CM | POA: Diagnosis not present

## 2017-07-29 DIAGNOSIS — D696 Thrombocytopenia, unspecified: Secondary | ICD-10-CM | POA: Diagnosis not present

## 2017-08-03 NOTE — Progress Notes (Signed)
Cardiology Office Note  Date: 08/04/2017   ID: Jeremy Weeks, DOB 16-Jun-1961, MRN [id number]  PCP: [name], MD  Evaluating Cardiologist: Nona Dell, MD   Chief Complaint  Patient presents with  . Hospitalization Follow-up    History of Present Illness: Jeremy Weeks is a 57 y.o. male seen in consultation at [facility] in November 2018. He presented at that time after an episode of apparent syncope, no obvious etiology was uncovered. He was not found to be orthostatic, cardiac monitoring did show sinus bradycardia although no pauses or sustained arrhythmias, and he was not symptomatic during the bradycardic events. Troponin I levels were negative for ACS. Echocardiogram demonstrated normal LVEF.  Subsequent 30 day event recorder was reviewed finding average heart rate of 66 with slowest rate 39 representing a possible ectopic atrial rhythm versus sinus bradycardia during early morning hours. No pauses or sustained arrhythmias.  He presents today with his wife for a follow-up visit. He has had no recurrent lightheadedness or syncope. He is following with Dr. Sherwood Gambler for risk factor modification, states that he had recent lab work obtained and is also now on Lipitor. He is not reporting any chest pain.  Past Medical History:  Diagnosis Date  . Chronic pain   . Hepatic cirrhosis (HCC) 08/28/2016  . Hepatitis C   . Hypothyroidism   . Thrombocytopenia (HCC) 01/22/2015    Past Surgical History:  Procedure Laterality Date  . BACK SURGERY    . CHOLECYSTECTOMY      Current Outpatient Medications  Medication Sig Dispense Refill  . ALPRAZolam (XANAX) 1 MG tablet Take 1 tablet (1 mg total) 3 (three) times daily by mouth. 30 tablet 0  . aspirin EC 81 MG tablet Take 81 mg by mouth daily.     Marland Kitchen atorvastatin (LIPITOR) 20 MG tablet Take 20 mg by mouth daily.    . Cyanocobalamin (VITAMIN B-12 PO) Take 1 tablet by mouth daily.    Marland Kitchen levothyroxine (SYNTHROID, LEVOTHROID) 125 MCG  tablet Take 1 tablet (125 mcg total) daily before breakfast by mouth. 30 tablet 0  . Multiple Vitamin (MULTIVITAMIN) tablet Take 1 tablet by mouth daily.    Marland Kitchen oxyCODONE-acetaminophen (PERCOCET) 10-325 MG tablet Take 1 tablet every 4 (four) hours as needed by mouth for pain. 30 tablet 0  . traZODone (DESYREL) 100 MG tablet TAKE ONE TABLET BY MOUTH AT BEDTIME AS NEEDED.  11   No current facility-administered medications for this visit.    Allergies:  Codeine   Social History: The patient  reports that he has been smoking cigarettes.  He has a 74.00 pack-year smoking history. he has never used smokeless tobacco. He reports that he does not drink alcohol or use drugs.   ROS:  Please see the history of present illness. Otherwise, complete review of systems is positive for chronic back pain.  All other systems are reviewed and negative.   Physical Exam: VS:  BP 122/88   Pulse 73   Ht 6' (1.829 m)   Wt 214 lb 3.2 oz (97.2 kg)   SpO2 96%   BMI 29.05 kg/m , BMI Body mass index is 29.05 kg/m.  Wt Readings from Last 3 Encounters:  08/04/17 214 lb 3.2 oz (97.2 kg)  05/18/17 201 lb 15.1 oz (91.6 kg)  02/25/17 220 lb (99.8 kg)    General: Patient appears comfortable at rest. Using a cane. HEENT: Conjunctiva and lids normal, oropharynx clear. Neck: Supple, no elevated JVP or carotid bruits, no  thyromegaly. Lungs: Clear to auscultation, nonlabored breathing at rest. Cardiac: Regular rate and rhythm, no S3 or significant systolic murmur. Abdomen: Soft, nontender, bowel sounds present. Extremities: No pitting edema, distal pulses 2+. Skin: Warm and dry. Musculoskeletal: No kyphosis. Neuropsychiatric: Alert and oriented x3, affect grossly appropriate.  ECG: I personally reviewed the tracing from 05/16/2017 which showed sinus rhythm with decreased R wave progression.  Recent Labwork: 05/16/2017: ALT 16; AST 21; Hemoglobin 16.9; Platelets 110 05/17/2017: BUN 11; Creatinine, Ser 0.95; Potassium  3.9; Sodium 136; TSH 0.030   Other Studies Reviewed Today:  Event recorder 06/08/2017: Available strips from 30 day event recorder reviewed. Rhythm is sinus with heart rate ranging 39 bpm (possible ectopic atrial rhythm versus sinus bradycardia at 4:52 AM on 12/9) up to 131 bpm (probable sinus tachycardia at 11:37 AM on 12/14) with average heart rate 66 bpm. There were no sustained arrhythmias or pauses.  Echocardiogram 05/17/2017: Study Conclusions  - Left ventricle: The cavity size was normal. Systolic function was   normal. The estimated ejection fraction was in the range of 55%   to 60%. Wall motion was normal; there were no regional wall   motion abnormalities. Left ventricular diastolic function   parameters were normal. - Aortic valve: Poorly visualized. - Mitral valve: There was trivial regurgitation. - Atrial septum: There was increased thickness of the septum,   consistent with lipomatous hypertrophy. No defect or patent   foramen ovale was identified.  Assessment and Plan:  1. History of syncope as outlined above. He has had no recurrences, and cardiac workup was overall reassuring. Potentially vasovagal event in November 2018. No further testing is planned at this time. He will keep follow-up with PCP.  2. Chronic back pain. He is on oxycodone and followed by Dr. Sherwood GamblerFusco for this.  3. History of hepatitis C and cirrhosis.  4. Hypothyroidism, on Synthroid.  Current medicines were reviewed with the patient today.  Disposition: Follow-up as needed.  Signed, Jonelle SidleSamuel G. Jeral Zick, MD, [facility]ACC 08/04/2017 11:02 AM    [facility] [address], [place], [place] [id number] Phone: [phone]; Fax: [phone]

## 2017-08-04 ENCOUNTER — Encounter: Payer: Self-pay | Admitting: Cardiology

## 2017-08-04 ENCOUNTER — Ambulatory Visit (INDEPENDENT_AMBULATORY_CARE_PROVIDER_SITE_OTHER): Payer: Medicare Other | Admitting: Cardiology

## 2017-08-04 VITALS — BP 122/88 | HR 73 | Ht 72.0 in | Wt 214.2 lb

## 2017-08-04 DIAGNOSIS — G8929 Other chronic pain: Secondary | ICD-10-CM | POA: Diagnosis not present

## 2017-08-04 DIAGNOSIS — Z8619 Personal history of other infectious and parasitic diseases: Secondary | ICD-10-CM | POA: Diagnosis not present

## 2017-08-04 DIAGNOSIS — Z87898 Personal history of other specified conditions: Secondary | ICD-10-CM

## 2017-08-04 DIAGNOSIS — E039 Hypothyroidism, unspecified: Secondary | ICD-10-CM

## 2017-08-04 NOTE — Patient Instructions (Signed)
Medication Instructions:   Your physician recommends that you continue on your current medications as directed. Please refer to the Current Medication list given to you today.  Labwork:  NONE  Testing/Procedures:  NONE  Follow-Up:  Your physician recommends that you schedule a follow-up appointment in: as needed.   Any Other Special Instructions Will Be Listed Below (If Applicable).  If you need a refill on your cardiac medications before your next appointment, please call your pharmacy. 

## 2017-08-20 DIAGNOSIS — K7469 Other cirrhosis of liver: Secondary | ICD-10-CM | POA: Diagnosis not present

## 2017-08-20 DIAGNOSIS — D696 Thrombocytopenia, unspecified: Secondary | ICD-10-CM | POA: Diagnosis not present

## 2017-08-20 DIAGNOSIS — G894 Chronic pain syndrome: Secondary | ICD-10-CM | POA: Diagnosis not present

## 2017-08-28 DIAGNOSIS — E559 Vitamin D deficiency, unspecified: Secondary | ICD-10-CM | POA: Diagnosis not present

## 2017-08-28 DIAGNOSIS — Z79899 Other long term (current) drug therapy: Secondary | ICD-10-CM | POA: Diagnosis not present

## 2017-08-28 DIAGNOSIS — E039 Hypothyroidism, unspecified: Secondary | ICD-10-CM | POA: Diagnosis not present

## 2017-08-28 DIAGNOSIS — E119 Type 2 diabetes mellitus without complications: Secondary | ICD-10-CM | POA: Diagnosis not present

## 2017-08-28 DIAGNOSIS — M5135 Other intervertebral disc degeneration, thoracolumbar region: Secondary | ICD-10-CM | POA: Diagnosis not present

## 2017-08-28 DIAGNOSIS — M545 Low back pain: Secondary | ICD-10-CM | POA: Diagnosis not present

## 2017-08-28 DIAGNOSIS — E038 Other specified hypothyroidism: Secondary | ICD-10-CM | POA: Diagnosis not present

## 2017-08-28 DIAGNOSIS — E7849 Other hyperlipidemia: Secondary | ICD-10-CM | POA: Diagnosis not present

## 2017-08-28 DIAGNOSIS — I10 Essential (primary) hypertension: Secondary | ICD-10-CM | POA: Diagnosis not present

## 2017-08-28 DIAGNOSIS — K7460 Unspecified cirrhosis of liver: Secondary | ICD-10-CM | POA: Diagnosis not present

## 2017-08-28 DIAGNOSIS — G8929 Other chronic pain: Secondary | ICD-10-CM | POA: Diagnosis not present

## 2017-08-28 DIAGNOSIS — M1990 Unspecified osteoarthritis, unspecified site: Secondary | ICD-10-CM | POA: Diagnosis not present

## 2017-08-28 DIAGNOSIS — D518 Other vitamin B12 deficiency anemias: Secondary | ICD-10-CM | POA: Diagnosis not present

## 2017-08-31 DIAGNOSIS — I10 Essential (primary) hypertension: Secondary | ICD-10-CM | POA: Diagnosis not present

## 2017-08-31 DIAGNOSIS — M1990 Unspecified osteoarthritis, unspecified site: Secondary | ICD-10-CM | POA: Diagnosis not present

## 2017-08-31 DIAGNOSIS — G8929 Other chronic pain: Secondary | ICD-10-CM | POA: Diagnosis not present

## 2017-08-31 DIAGNOSIS — M5135 Other intervertebral disc degeneration, thoracolumbar region: Secondary | ICD-10-CM | POA: Diagnosis not present

## 2017-09-02 DIAGNOSIS — E7849 Other hyperlipidemia: Secondary | ICD-10-CM | POA: Diagnosis not present

## 2017-09-02 DIAGNOSIS — E038 Other specified hypothyroidism: Secondary | ICD-10-CM | POA: Diagnosis not present

## 2017-09-02 DIAGNOSIS — G8929 Other chronic pain: Secondary | ICD-10-CM | POA: Diagnosis not present

## 2017-09-02 DIAGNOSIS — M5135 Other intervertebral disc degeneration, thoracolumbar region: Secondary | ICD-10-CM | POA: Diagnosis not present

## 2017-09-02 DIAGNOSIS — E119 Type 2 diabetes mellitus without complications: Secondary | ICD-10-CM | POA: Diagnosis not present

## 2017-09-02 DIAGNOSIS — E559 Vitamin D deficiency, unspecified: Secondary | ICD-10-CM | POA: Diagnosis not present

## 2017-09-02 DIAGNOSIS — Z79899 Other long term (current) drug therapy: Secondary | ICD-10-CM | POA: Diagnosis not present

## 2017-09-02 DIAGNOSIS — I10 Essential (primary) hypertension: Secondary | ICD-10-CM | POA: Diagnosis not present

## 2017-09-02 DIAGNOSIS — E039 Hypothyroidism, unspecified: Secondary | ICD-10-CM | POA: Diagnosis not present

## 2017-09-02 DIAGNOSIS — D518 Other vitamin B12 deficiency anemias: Secondary | ICD-10-CM | POA: Diagnosis not present

## 2017-09-03 DIAGNOSIS — K7460 Unspecified cirrhosis of liver: Secondary | ICD-10-CM | POA: Diagnosis not present

## 2017-09-03 DIAGNOSIS — E039 Hypothyroidism, unspecified: Secondary | ICD-10-CM | POA: Diagnosis not present

## 2017-09-03 DIAGNOSIS — M5135 Other intervertebral disc degeneration, thoracolumbar region: Secondary | ICD-10-CM | POA: Diagnosis not present

## 2017-09-18 DIAGNOSIS — T887XXD Unspecified adverse effect of drug or medicament, subsequent encounter: Secondary | ICD-10-CM | POA: Diagnosis not present

## 2017-09-18 DIAGNOSIS — K5909 Other constipation: Secondary | ICD-10-CM | POA: Diagnosis not present

## 2017-09-18 DIAGNOSIS — G894 Chronic pain syndrome: Secondary | ICD-10-CM | POA: Diagnosis not present

## 2017-11-19 DIAGNOSIS — G894 Chronic pain syndrome: Secondary | ICD-10-CM | POA: Diagnosis not present

## 2017-11-19 DIAGNOSIS — Z1389 Encounter for screening for other disorder: Secondary | ICD-10-CM | POA: Diagnosis not present

## 2017-11-19 DIAGNOSIS — M2550 Pain in unspecified joint: Secondary | ICD-10-CM | POA: Diagnosis not present

## 2017-11-19 DIAGNOSIS — I10 Essential (primary) hypertension: Secondary | ICD-10-CM | POA: Diagnosis not present

## 2017-12-03 DIAGNOSIS — H25813 Combined forms of age-related cataract, bilateral: Secondary | ICD-10-CM | POA: Diagnosis not present

## 2017-12-03 DIAGNOSIS — H524 Presbyopia: Secondary | ICD-10-CM | POA: Diagnosis not present

## 2017-12-08 DIAGNOSIS — H5213 Myopia, bilateral: Secondary | ICD-10-CM | POA: Diagnosis not present

## 2017-12-18 DIAGNOSIS — H5201 Hypermetropia, right eye: Secondary | ICD-10-CM | POA: Diagnosis not present

## 2017-12-18 DIAGNOSIS — H524 Presbyopia: Secondary | ICD-10-CM | POA: Diagnosis not present

## 2017-12-21 DIAGNOSIS — M1991 Primary osteoarthritis, unspecified site: Secondary | ICD-10-CM | POA: Diagnosis not present

## 2017-12-21 DIAGNOSIS — M5136 Other intervertebral disc degeneration, lumbar region: Secondary | ICD-10-CM | POA: Diagnosis not present

## 2017-12-21 DIAGNOSIS — G894 Chronic pain syndrome: Secondary | ICD-10-CM | POA: Diagnosis not present

## 2018-01-20 IMAGING — US US RENAL
1 series · 14 of 25 positions shown · non-contrast
Comparison: 09/04/2016.

CLINICAL DATA: New BILATERAL flank pain for 1 week with difficulty
urinating.

EXAM:
RENAL / URINARY TRACT ULTRASOUND COMPLETE

[Series 1: us renal · 0.19mm/px · 14 of 52 slices shown]
[im 1/52]
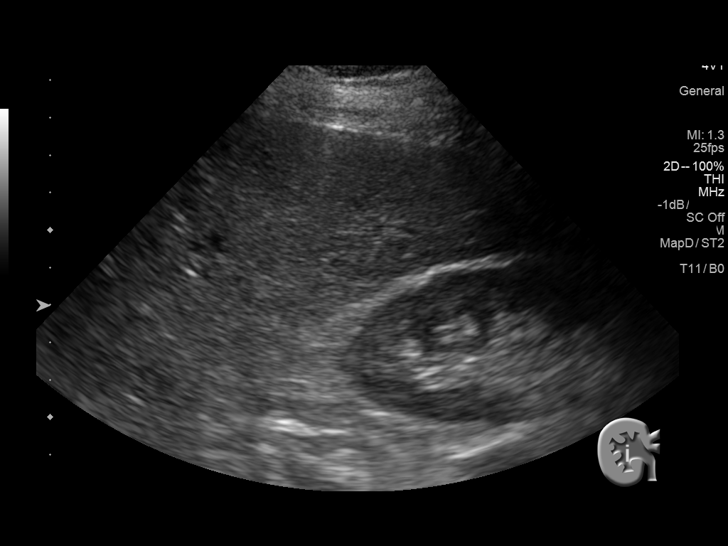
[im 5/52]
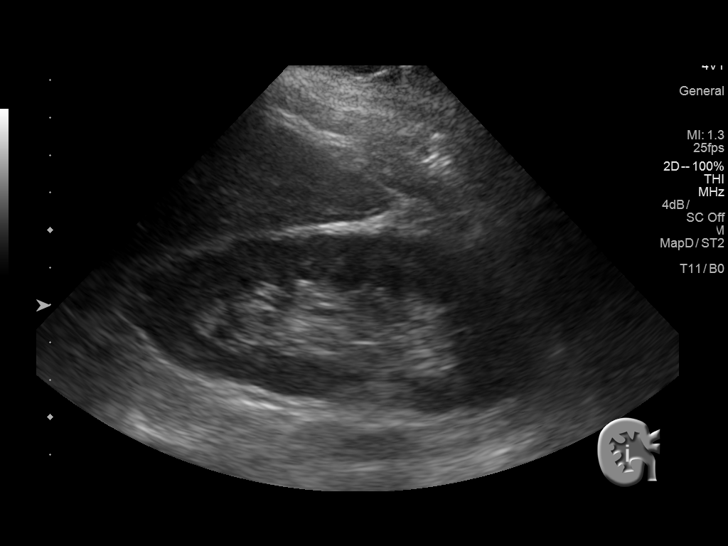
[im 9/52]
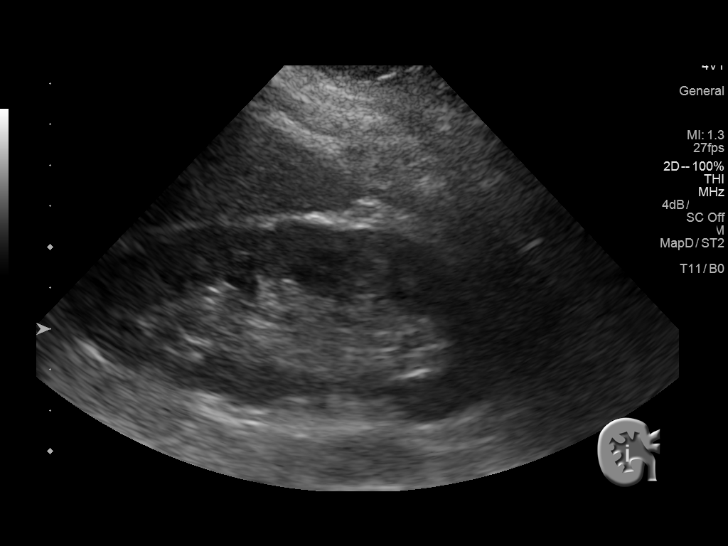
[im 13/52]
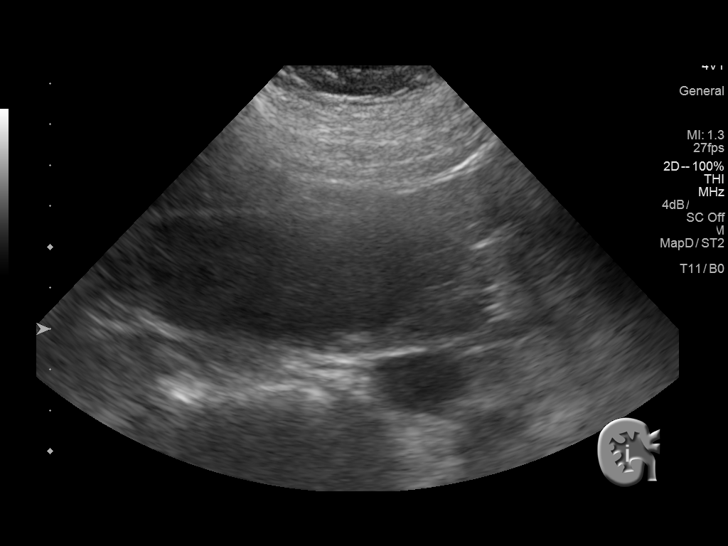
[im 18/52]
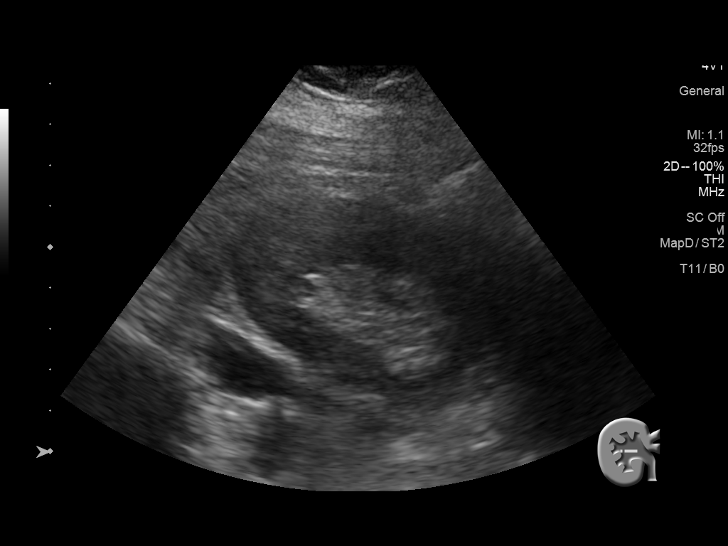
[im 20/52]
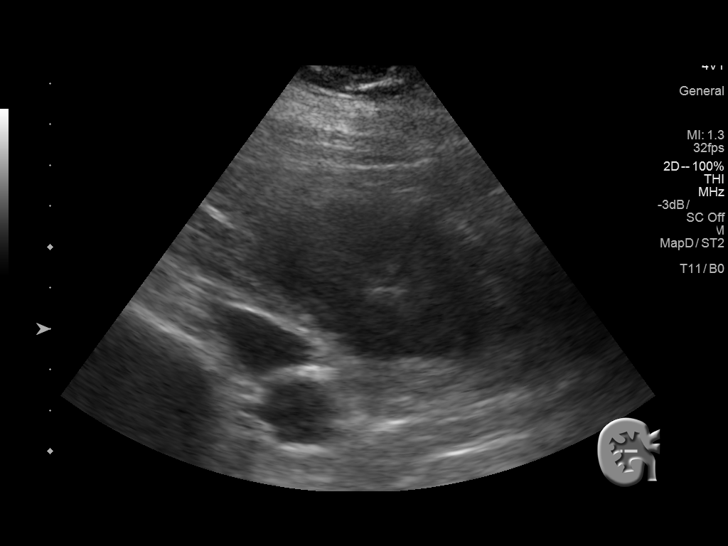
[im 24/52]
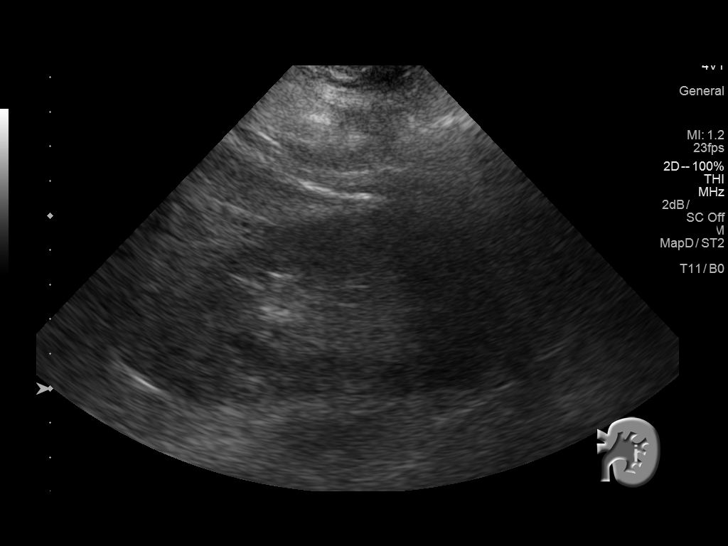
[im 28/52]
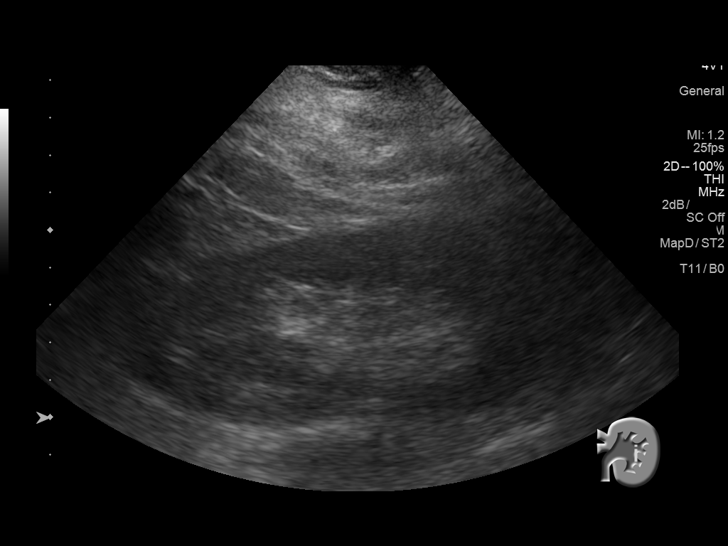
[im 32/52]
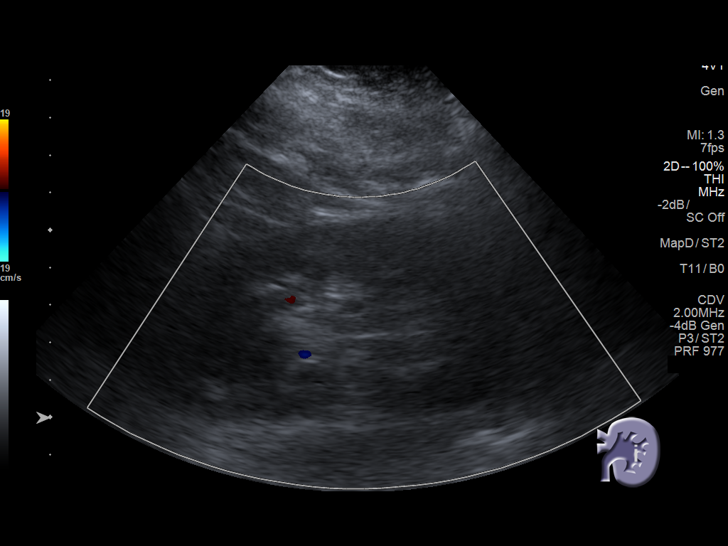
[im 35/52]
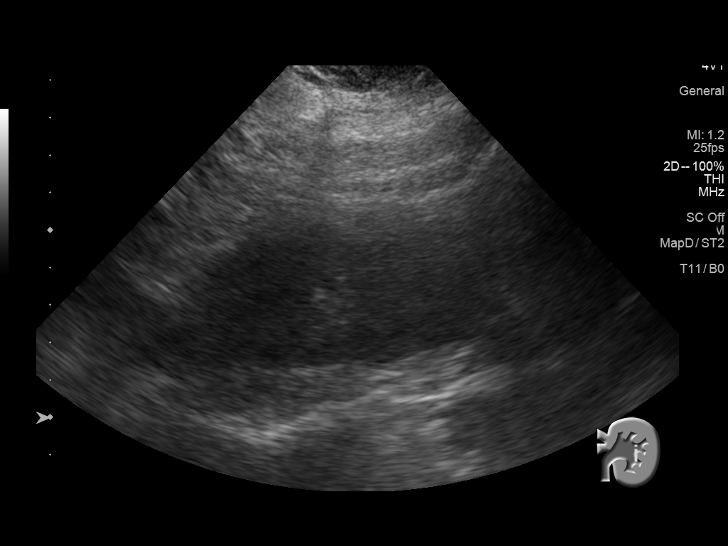
[im 39/52]
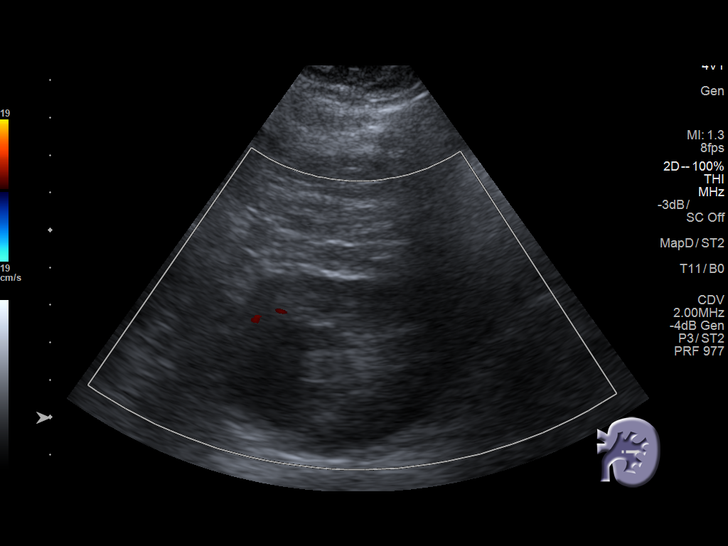
[im 43/52]
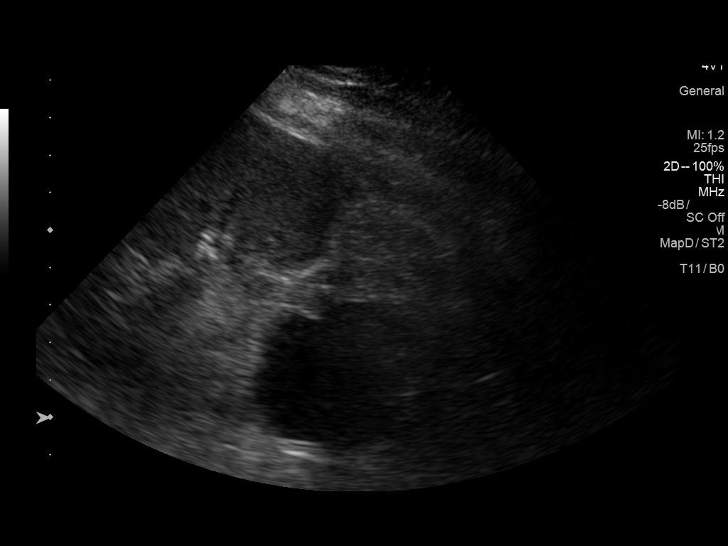
[im 47/52]
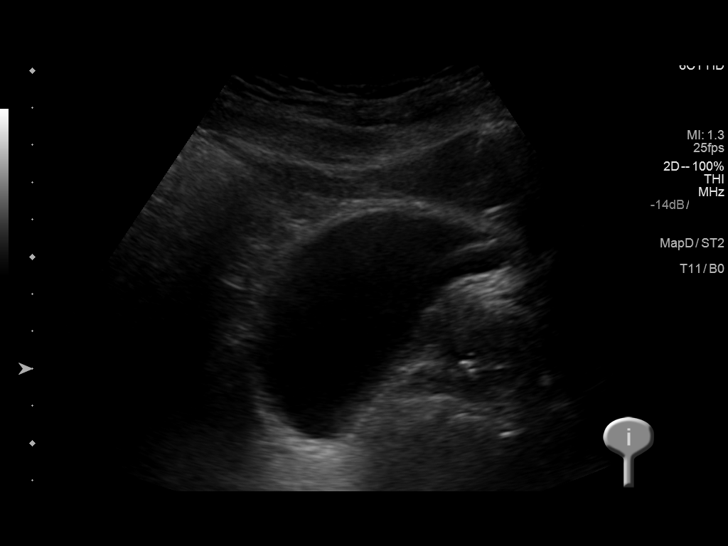
[im 52/52]
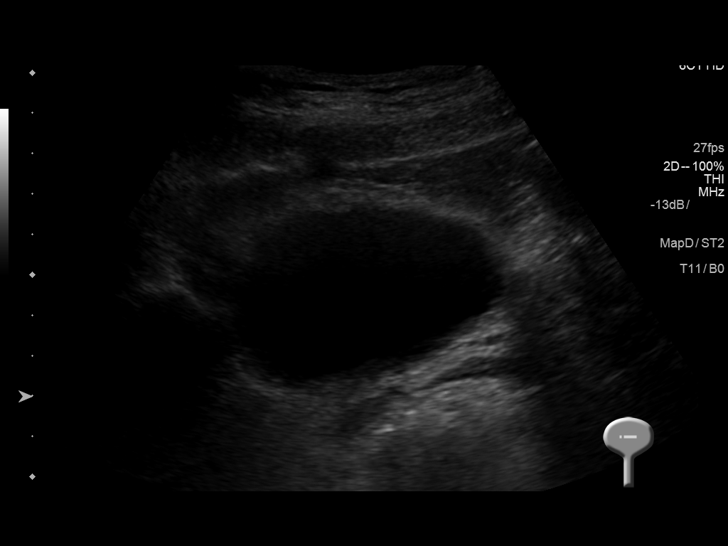

[14 of 25 positions shown; findings below may reference images not displayed]

FINDINGS: Right Kidney:

Length: 11.5 cm. Echogenicity within normal limits. No mass or
hydronephrosis visualized.

Left Kidney:

Length: 11.8 cm. Echogenicity within normal limits. No mass or
hydronephrosis visualized. Parapelvic cyst identified, similar to
priors.

Bladder:

Appears normal for degree of bladder distention.
IMPRESSION: No acute findings. No solid mass or hydronephrosis. Similar
appearance to priors.

## 2018-01-20 IMAGING — US US ART/VEN ABD/PELV/SCROTUM DOPPLER LTD
1 series · 14 of 25 positions shown · non-contrast
Comparison: 09/04/2016.

CLINICAL DATA: Right testicular pain

EXAM:
SCROTAL ULTRASOUND
DOPPLER ULTRASOUND OF THE TESTICLES
TECHNIQUE: Complete ultrasound examination of the testicles, epididymis, and
other scrotal structures was performed. Color and spectral Doppler
ultrasound were also utilized to evaluate blood flow to the
testicles.

[Series 1: us art/ven abd/pelv/scrotum doppler ltd · 0.05mm/px · 14 of 63 slices shown]
[im 1/63]
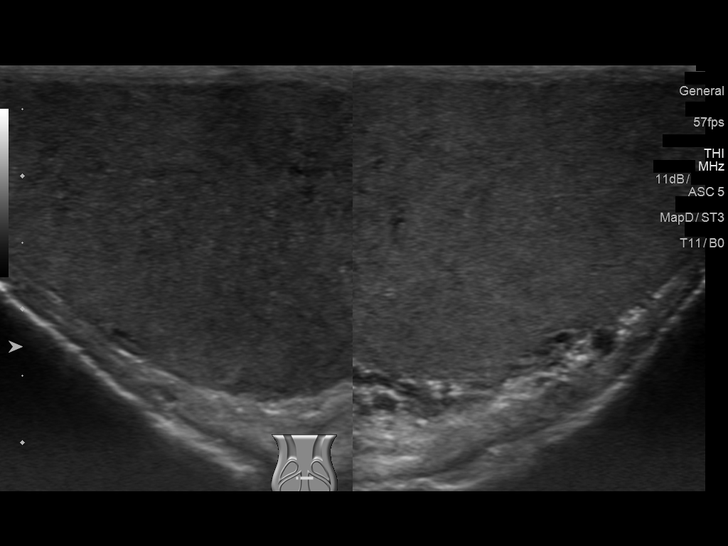
[im 6/63]
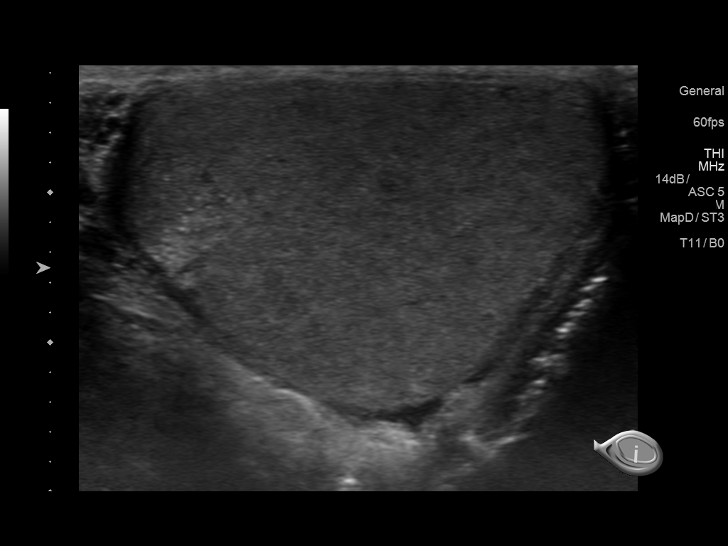
[im 11/63]
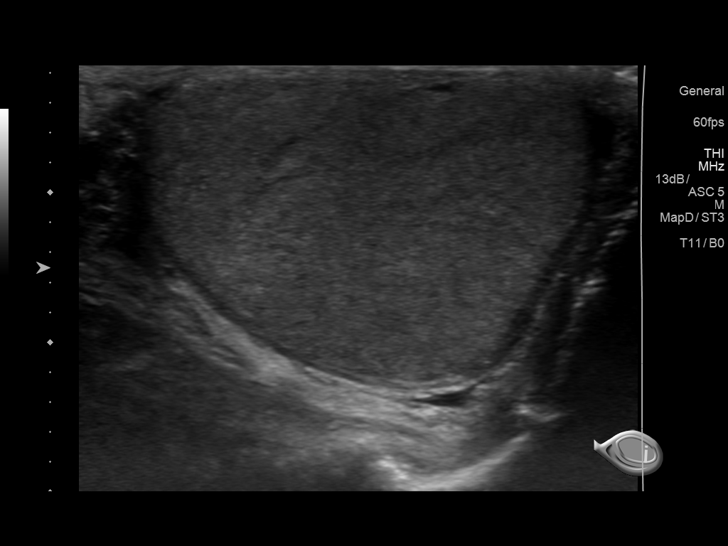
[im 16/63]
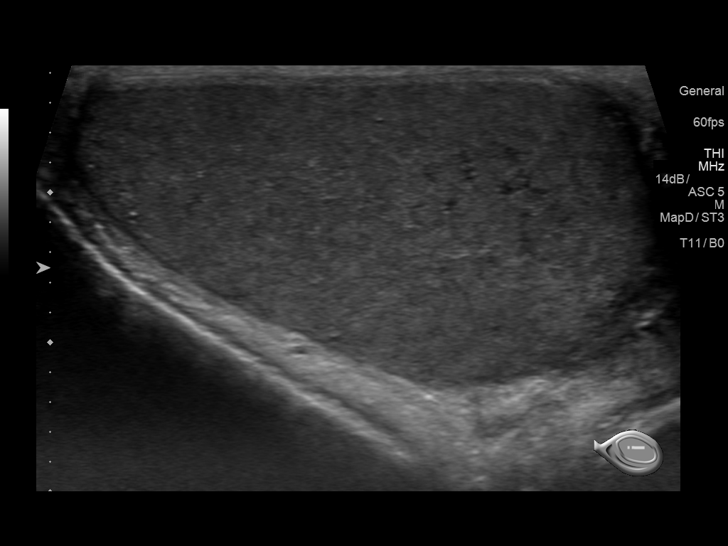
[im 21/63]
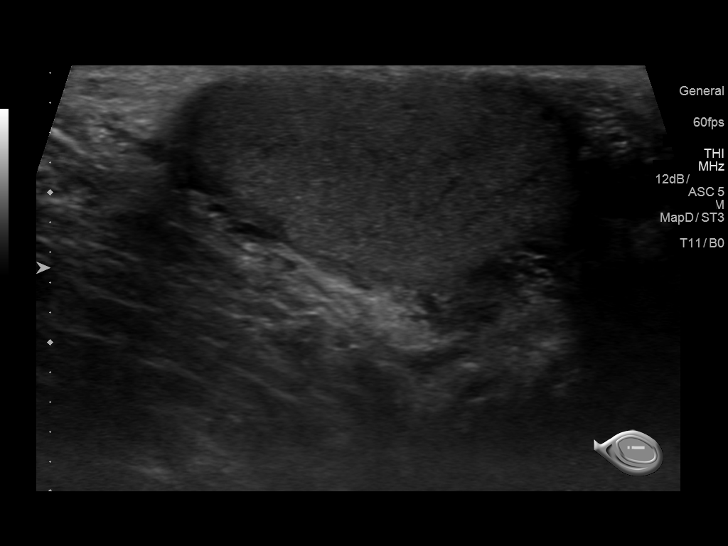
[im 24/63]
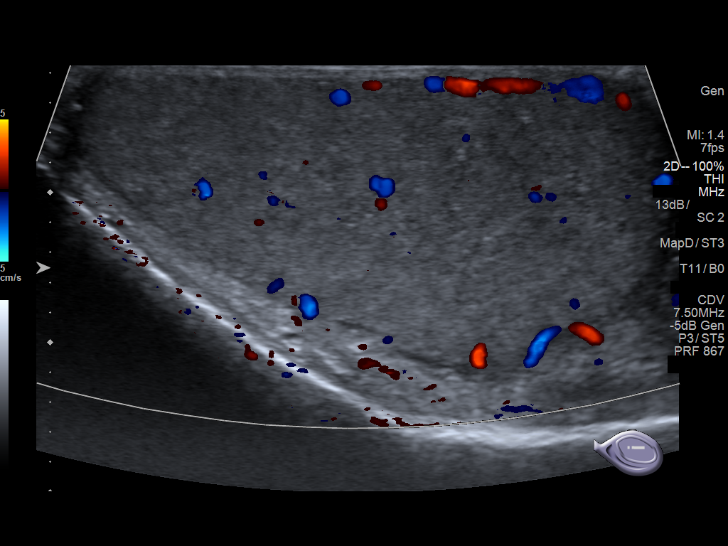
[im 29/63]
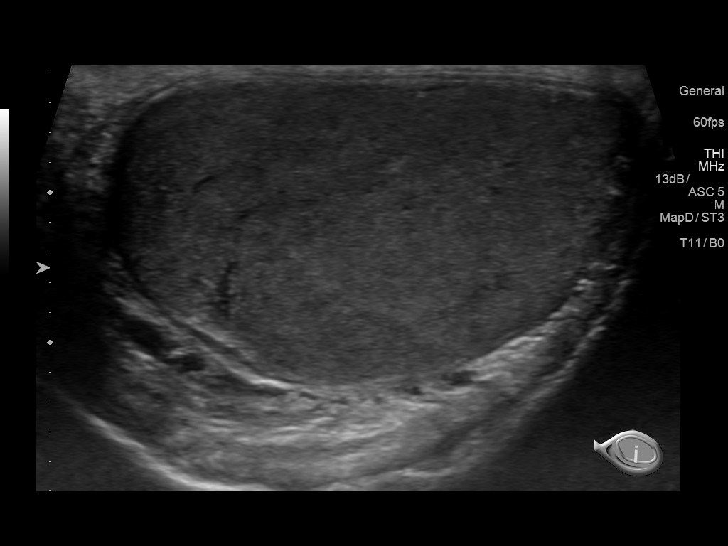
[im 34/63]
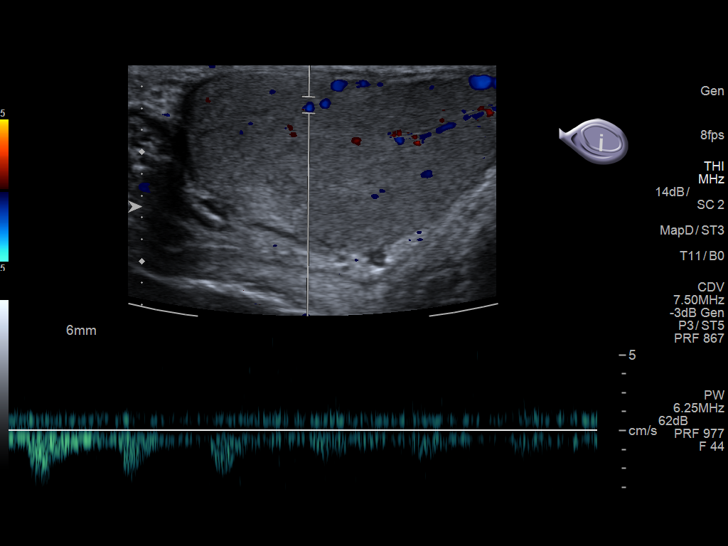
[im 39/63]
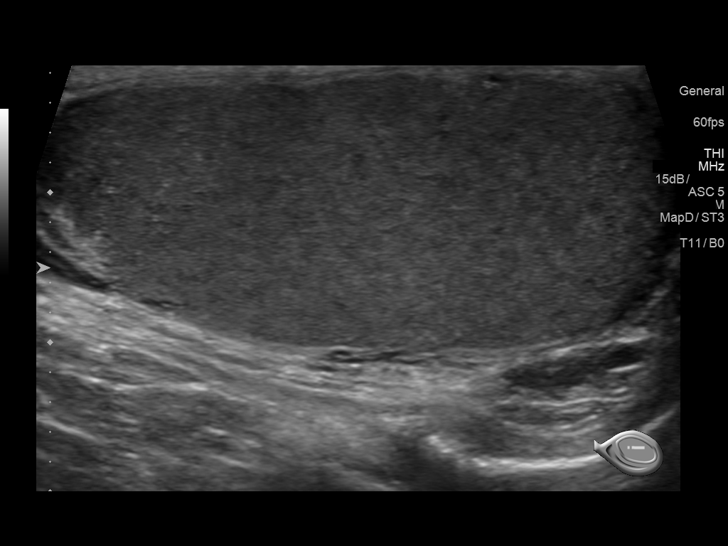
[im 42/63]
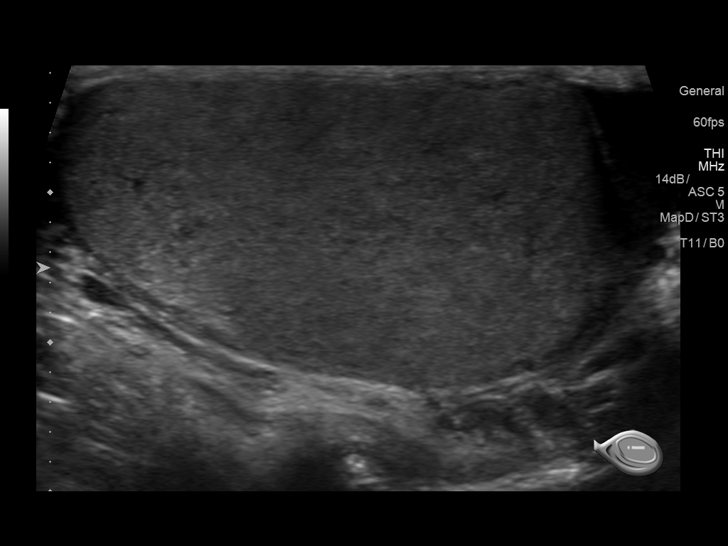
[im 47/63]
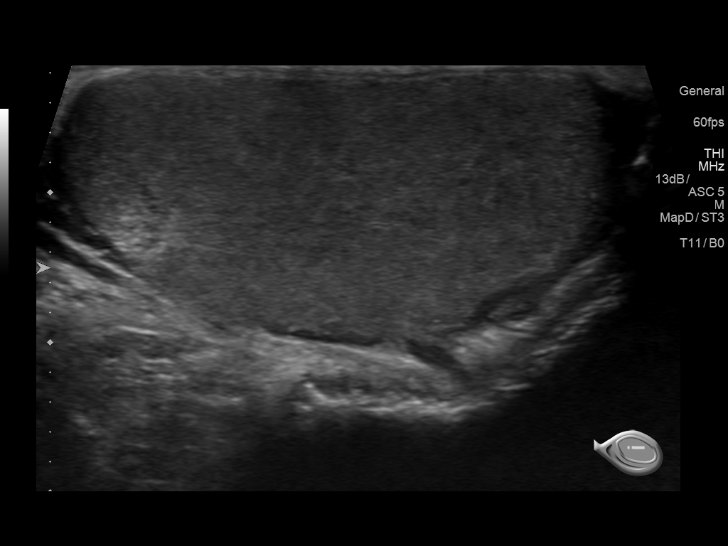
[im 52/63]
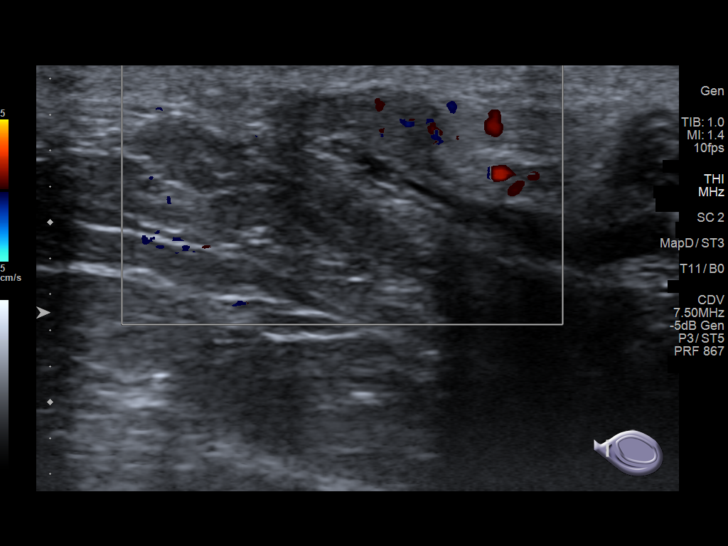
[im 57/63]
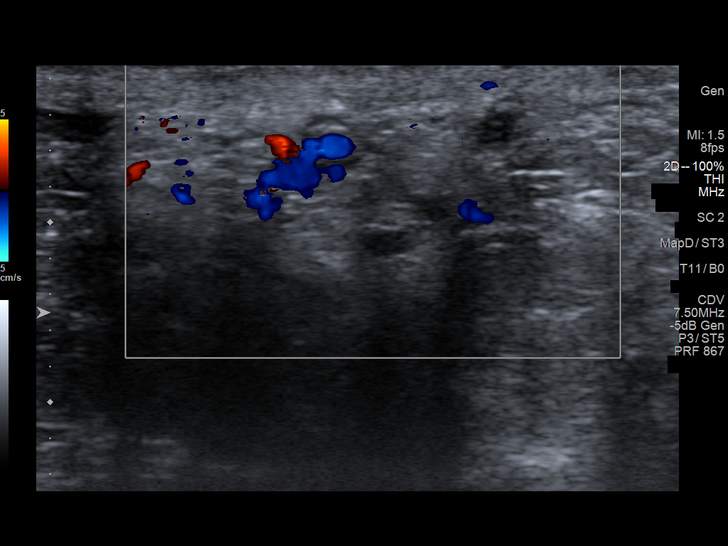
[im 63/63]
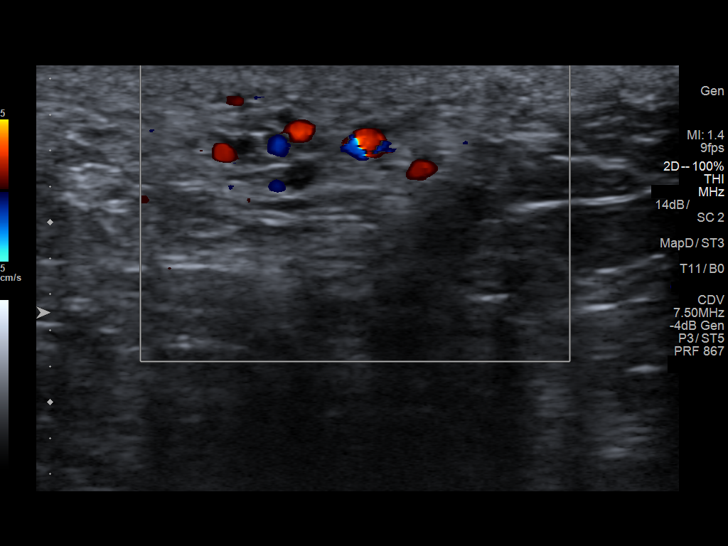

[14 of 25 positions shown; findings below may reference images not displayed]

FINDINGS: Right testicle

Measurements: 4.1 x 2.4 x 3.3 cm. No mass or microlithiasis
visualized.

Left testicle

Measurements: 4.3 x 1.8 x 3.4 cm. No mass or microlithiasis
visualized.

Right epididymis:  Normal in size and appearance.

Left epididymis:  Normal in size and appearance.

Hydrocele:  Tiny bilateral hydroceles.

Varicocele:  None visualized.

Pulsed Doppler interrogation of both testes demonstrates normal low
resistance arterial and venous waveforms bilaterally.
IMPRESSION: Tiny bilateral hydroceles otherwise negative exam. No evidence of
testicular mass or torsion .

## 2018-02-22 DIAGNOSIS — Z0001 Encounter for general adult medical examination with abnormal findings: Secondary | ICD-10-CM | POA: Diagnosis not present

## 2018-02-22 DIAGNOSIS — Z1389 Encounter for screening for other disorder: Secondary | ICD-10-CM | POA: Diagnosis not present

## 2018-02-22 DIAGNOSIS — G894 Chronic pain syndrome: Secondary | ICD-10-CM | POA: Diagnosis not present

## 2018-02-22 DIAGNOSIS — I10 Essential (primary) hypertension: Secondary | ICD-10-CM | POA: Diagnosis not present

## 2018-02-22 DIAGNOSIS — E063 Autoimmune thyroiditis: Secondary | ICD-10-CM | POA: Diagnosis not present

## 2018-02-25 ENCOUNTER — Ambulatory Visit (INDEPENDENT_AMBULATORY_CARE_PROVIDER_SITE_OTHER): Payer: Medicare Other | Admitting: Internal Medicine

## 2018-03-02 ENCOUNTER — Ambulatory Visit (INDEPENDENT_AMBULATORY_CARE_PROVIDER_SITE_OTHER): Payer: Medicare Other | Admitting: Internal Medicine

## 2018-03-29 ENCOUNTER — Encounter (INDEPENDENT_AMBULATORY_CARE_PROVIDER_SITE_OTHER): Payer: Self-pay | Admitting: Internal Medicine

## 2018-03-29 ENCOUNTER — Ambulatory Visit (INDEPENDENT_AMBULATORY_CARE_PROVIDER_SITE_OTHER): Payer: Medicare Other | Admitting: Internal Medicine

## 2018-03-29 ENCOUNTER — Encounter (INDEPENDENT_AMBULATORY_CARE_PROVIDER_SITE_OTHER): Payer: Self-pay | Admitting: *Deleted

## 2018-03-29 VITALS — BP 118/80 | HR 68 | Resp 14 | Ht 72.0 in | Wt 224.2 lb

## 2018-03-29 DIAGNOSIS — B182 Chronic viral hepatitis C: Secondary | ICD-10-CM | POA: Diagnosis not present

## 2018-03-29 NOTE — Progress Notes (Signed)
   Subjective:    Patient ID: Jeremy DarlingJoseph M Haskin, male    DOB: 04/01/1961, 57 y.o.   MRN: [id number]  HPI Here today for f/u. Last seen in August of 2018.  Treated in 2016 with Harvoni.  Genotype 1B He is doing well. He is living in an assisted living. His appetite is good. No weight loss. BM x 1. No melena or BRRB. He has no GI complaints.    Review of Systems Past Medical History:  Diagnosis Date  . Chronic pain   . Hepatic cirrhosis (HCC) 08/28/2016  . Hepatitis C   . Hypothyroidism   . Thrombocytopenia (HCC) 01/22/2015    Past Surgical History:  Procedure Laterality Date  . BACK SURGERY    . CHOLECYSTECTOMY      Allergies  Allergen Reactions  . Codeine Rash    Current Outpatient Medications on File Prior to Visit  Medication Sig Dispense Refill  . ALPRAZolam (XANAX) 1 MG tablet Take 1 tablet (1 mg total) 3 (three) times daily by mouth. 30 tablet 0  . aspirin EC 81 MG tablet Take 81 mg by mouth daily.     Marland Kitchen. atorvastatin (LIPITOR) 20 MG tablet Take 20 mg by mouth daily.    . Cyanocobalamin (VITAMIN B-12 PO) Take 1 tablet by mouth daily.    Marland Kitchen. levothyroxine (SYNTHROID, LEVOTHROID) 125 MCG tablet Take 1 tablet (125 mcg total) daily before breakfast by mouth. 30 tablet 0  . Multiple Vitamin (MULTIVITAMIN) tablet Take 1 tablet by mouth daily.    Marland Kitchen. oxyCODONE-acetaminophen (PERCOCET) 10-325 MG tablet Take 1 tablet every 4 (four) hours as needed by mouth for pain. 30 tablet 0  . traZODone (DESYREL) 100 MG tablet TAKE ONE TABLET BY MOUTH AT BEDTIME AS NEEDED.  11   No current facility-administered medications on file prior to visit.         Objective:   Physical Exam Blood pressure 118/80, pulse 68, resp. rate 14, height 6' (1.829 m), weight 224 lb 3.2 oz (101.7 kg). Alert and oriented. Skin warm and dry. Oral mucosa is moist.   . Sclera anicteric, conjunctivae is pink. Thyroid not enlarged. No cervical lymphadenopathy. Lungs clear. Heart regular rate and rhythm.  Abdomen is  soft. Bowel sounds are positive. No hepatomegaly. No abdominal masses felt. No tenderness.  No edema to lower extremities. Patient is alert and oriented.         Assessment & Plan:  Hepatitis C. CBC, Hep C quaint, Hepatic.  US RUQ. OV 1 yrs.

## 2018-03-29 NOTE — Patient Instructions (Signed)
OV in 1 year.  

## 2018-03-31 LAB — HEPATIC FUNCTION PANEL
AG Ratio: 1.6 (calc) (ref 1.0–2.5)
ALBUMIN MSPROF: 4.5 g/dL (ref 3.6–5.1)
ALT: 9 U/L (ref 9–46)
AST: 17 U/L (ref 10–35)
Alkaline phosphatase (APISO): 57 U/L (ref 40–115)
BILIRUBIN INDIRECT: 0.4 mg/dL (ref 0.2–1.2)
Bilirubin, Direct: 0.1 mg/dL (ref 0.0–0.2)
GLOBULIN: 2.9 g/dL (ref 1.9–3.7)
TOTAL PROTEIN: 7.4 g/dL (ref 6.1–8.1)
Total Bilirubin: 0.5 mg/dL (ref 0.2–1.2)

## 2018-03-31 LAB — CBC WITH DIFFERENTIAL/PLATELET
BASOS ABS: 38 {cells}/uL (ref 0–200)
Basophils Relative: 0.4 %
EOS ABS: 162 {cells}/uL (ref 15–500)
Eosinophils Relative: 1.7 %
HEMATOCRIT: 43.1 % (ref 38.5–50.0)
HEMOGLOBIN: 14.6 g/dL (ref 13.2–17.1)
LYMPHS ABS: 3088 {cells}/uL (ref 850–3900)
MCH: 32 pg (ref 27.0–33.0)
MCHC: 33.9 g/dL (ref 32.0–36.0)
MCV: 94.5 fL (ref 80.0–100.0)
MONOS PCT: 5.9 %
MPV: 12.6 fL — ABNORMAL HIGH (ref 7.5–12.5)
Neutro Abs: 5653 cells/uL (ref 1500–7800)
Neutrophils Relative %: 59.5 %
Platelets: 97 10*3/uL — ABNORMAL LOW (ref 140–400)
RBC: 4.56 10*6/uL (ref 4.20–5.80)
RDW: 12.3 % (ref 11.0–15.0)
Total Lymphocyte: 32.5 %
WBC: 9.5 10*3/uL (ref 3.8–10.8)
WBCMIX: 561 {cells}/uL (ref 200–950)

## 2018-03-31 LAB — HEPATITIS C RNA QUANTITATIVE
HCV Quantitative Log: <1.18 Log IU/mL
HCV RNA, PCR, QN: NOT DETECTED [IU]/mL

## 2018-04-02 ENCOUNTER — Ambulatory Visit (HOSPITAL_COMMUNITY)
Admission: RE | Admit: 2018-04-02 | Discharge: 2018-04-02 | Disposition: A | Discharge status: Home / Self Care | Payer: Medicare Other | Source: Ambulatory Visit | Attending: Internal Medicine | Admitting: Internal Medicine

## 2018-04-02 ENCOUNTER — Telehealth (INDEPENDENT_AMBULATORY_CARE_PROVIDER_SITE_OTHER): Payer: Self-pay | Admitting: Internal Medicine

## 2018-04-02 DIAGNOSIS — Z9049 Acquired absence of other specified parts of digestive tract: Secondary | ICD-10-CM | POA: Diagnosis not present

## 2018-04-02 DIAGNOSIS — B182 Chronic viral hepatitis C: Secondary | ICD-10-CM | POA: Insufficient documentation

## 2018-04-02 NOTE — Telephone Encounter (Signed)
Patient returned your call.

## 2018-04-06 NOTE — Telephone Encounter (Signed)
I have left him a message on his cell phone

## 2018-05-03 DIAGNOSIS — E063 Autoimmune thyroiditis: Secondary | ICD-10-CM | POA: Diagnosis not present

## 2018-05-03 DIAGNOSIS — G894 Chronic pain syndrome: Secondary | ICD-10-CM | POA: Diagnosis not present

## 2018-05-03 DIAGNOSIS — Z1389 Encounter for screening for other disorder: Secondary | ICD-10-CM | POA: Diagnosis not present

## 2018-05-03 DIAGNOSIS — M1991 Primary osteoarthritis, unspecified site: Secondary | ICD-10-CM | POA: Diagnosis not present

## 2018-05-15 DIAGNOSIS — Z1211 Encounter for screening for malignant neoplasm of colon: Secondary | ICD-10-CM | POA: Diagnosis not present

## 2018-06-21 DIAGNOSIS — Z7689 Persons encountering health services in other specified circumstances: Secondary | ICD-10-CM | POA: Diagnosis not present

## 2018-06-21 DIAGNOSIS — G894 Chronic pain syndrome: Secondary | ICD-10-CM | POA: Diagnosis not present

## 2018-06-21 DIAGNOSIS — Z1389 Encounter for screening for other disorder: Secondary | ICD-10-CM | POA: Diagnosis not present

## 2018-08-10 DIAGNOSIS — G4709 Other insomnia: Secondary | ICD-10-CM | POA: Diagnosis not present

## 2018-08-10 DIAGNOSIS — Z1389 Encounter for screening for other disorder: Secondary | ICD-10-CM | POA: Diagnosis not present

## 2018-08-10 DIAGNOSIS — Z7689 Persons encountering health services in other specified circumstances: Secondary | ICD-10-CM | POA: Diagnosis not present

## 2018-08-10 DIAGNOSIS — G894 Chronic pain syndrome: Secondary | ICD-10-CM | POA: Diagnosis not present

## 2018-09-03 DIAGNOSIS — K7460 Unspecified cirrhosis of liver: Secondary | ICD-10-CM | POA: Diagnosis not present

## 2018-09-03 DIAGNOSIS — Z1389 Encounter for screening for other disorder: Secondary | ICD-10-CM | POA: Diagnosis not present

## 2018-09-03 DIAGNOSIS — G4709 Other insomnia: Secondary | ICD-10-CM | POA: Diagnosis not present

## 2018-09-03 DIAGNOSIS — I10 Essential (primary) hypertension: Secondary | ICD-10-CM | POA: Diagnosis not present

## 2018-09-03 DIAGNOSIS — G894 Chronic pain syndrome: Secondary | ICD-10-CM | POA: Diagnosis not present

## 2018-09-03 DIAGNOSIS — Z0001 Encounter for general adult medical examination with abnormal findings: Secondary | ICD-10-CM | POA: Diagnosis not present

## 2018-09-03 DIAGNOSIS — M5136 Other intervertebral disc degeneration, lumbar region: Secondary | ICD-10-CM | POA: Diagnosis not present

## 2018-09-30 DIAGNOSIS — G4709 Other insomnia: Secondary | ICD-10-CM | POA: Diagnosis not present

## 2018-09-30 DIAGNOSIS — G894 Chronic pain syndrome: Secondary | ICD-10-CM | POA: Diagnosis not present

## 2018-12-01 DIAGNOSIS — K7460 Unspecified cirrhosis of liver: Secondary | ICD-10-CM | POA: Diagnosis not present

## 2018-12-01 DIAGNOSIS — E063 Autoimmune thyroiditis: Secondary | ICD-10-CM | POA: Diagnosis not present

## 2018-12-01 DIAGNOSIS — I10 Essential (primary) hypertension: Secondary | ICD-10-CM | POA: Diagnosis not present

## 2018-12-01 DIAGNOSIS — G894 Chronic pain syndrome: Secondary | ICD-10-CM | POA: Diagnosis not present

## 2018-12-01 DIAGNOSIS — M1991 Primary osteoarthritis, unspecified site: Secondary | ICD-10-CM | POA: Diagnosis not present

## 2019-01-31 DIAGNOSIS — G4700 Insomnia, unspecified: Secondary | ICD-10-CM | POA: Diagnosis not present

## 2019-01-31 DIAGNOSIS — G894 Chronic pain syndrome: Secondary | ICD-10-CM | POA: Diagnosis not present

## 2019-01-31 DIAGNOSIS — D696 Thrombocytopenia, unspecified: Secondary | ICD-10-CM | POA: Diagnosis not present

## 2019-03-11 DIAGNOSIS — D696 Thrombocytopenia, unspecified: Secondary | ICD-10-CM | POA: Diagnosis not present

## 2019-03-11 DIAGNOSIS — I10 Essential (primary) hypertension: Secondary | ICD-10-CM | POA: Diagnosis not present

## 2019-03-11 DIAGNOSIS — M2550 Pain in unspecified joint: Secondary | ICD-10-CM | POA: Diagnosis not present

## 2019-03-11 DIAGNOSIS — G894 Chronic pain syndrome: Secondary | ICD-10-CM | POA: Diagnosis not present

## 2019-03-26 DIAGNOSIS — Z7982 Long term (current) use of aspirin: Secondary | ICD-10-CM | POA: Diagnosis not present

## 2019-03-26 DIAGNOSIS — Z23 Encounter for immunization: Secondary | ICD-10-CM | POA: Diagnosis not present

## 2019-03-26 DIAGNOSIS — W540XXA Bitten by dog, initial encounter: Secondary | ICD-10-CM | POA: Diagnosis not present

## 2019-03-26 DIAGNOSIS — E039 Hypothyroidism, unspecified: Secondary | ICD-10-CM | POA: Diagnosis not present

## 2019-03-26 DIAGNOSIS — Z79899 Other long term (current) drug therapy: Secondary | ICD-10-CM | POA: Diagnosis not present

## 2019-03-26 DIAGNOSIS — S51852A Open bite of left forearm, initial encounter: Secondary | ICD-10-CM | POA: Diagnosis not present

## 2019-03-30 ENCOUNTER — Encounter (INDEPENDENT_AMBULATORY_CARE_PROVIDER_SITE_OTHER): Payer: Self-pay | Admitting: Nurse Practitioner

## 2019-03-30 ENCOUNTER — Other Ambulatory Visit: Payer: Self-pay

## 2019-03-30 ENCOUNTER — Ambulatory Visit (INDEPENDENT_AMBULATORY_CARE_PROVIDER_SITE_OTHER): Payer: Medicare Other | Admitting: Nurse Practitioner

## 2019-03-30 VITALS — BP 121/84 | HR 60 | Temp 97.7°F | Ht 72.0 in | Wt 220.5 lb

## 2019-03-30 DIAGNOSIS — Z8619 Personal history of other infectious and parasitic diseases: Secondary | ICD-10-CM | POA: Insufficient documentation

## 2019-03-30 DIAGNOSIS — D696 Thrombocytopenia, unspecified: Secondary | ICD-10-CM
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
## 2019-03-30 NOTE — Patient Instructions (Signed)
1. Complete the ordered blood tests today  2. Further follow up to be determined after I review your lab results  3. I will request a copy of your most recent Cologuard test from your primary doctor's office

## 2019-04-03 LAB — CBC WITH DIFFERENTIAL/PLATELET
Absolute Monocytes: 460 cells/uL (ref 200–950)
Basophils Absolute: 39 cells/uL (ref 0–200)
Basophils Relative: 0.5 %
Eosinophils Absolute: 140 cells/uL (ref 15–500)
Eosinophils Relative: 1.8 %
HCT: 43.5 % (ref 38.5–50.0)
Hemoglobin: 14.5 g/dL (ref 13.2–17.1)
Lymphs Abs: 2636 cells/uL (ref 850–3900)
MCH: 31.4 pg (ref 27.0–33.0)
MCHC: 33.3 g/dL (ref 32.0–36.0)
MCV: 94.2 fL (ref 80.0–100.0)
MPV: 12.8 fL — ABNORMAL HIGH (ref 7.5–12.5)
Monocytes Relative: 5.9 %
Neutro Abs: 4524 cells/uL (ref 1500–7800)
Neutrophils Relative %: 58 %
Platelets: 140 10*3/uL (ref 140–400)
RBC: 4.62 10*6/uL (ref 4.20–5.80)
RDW: 11.6 % (ref 11.0–15.0)
Total Lymphocyte: 33.8 %
WBC: 7.8 10*3/uL (ref 3.8–10.8)

## 2019-04-03 LAB — COMPREHENSIVE METABOLIC PANEL
AG Ratio: 1.6 (calc) (ref 1.0–2.5)
ALT: 12 U/L (ref 9–46)
AST: 18 U/L (ref 10–35)
Albumin: 4.5 g/dL (ref 3.6–5.1)
Alkaline phosphatase (APISO): 87 U/L (ref 35–144)
BUN: 17 mg/dL (ref 7–25)
CO2: 28 mmol/L (ref 20–32)
Calcium: 9.9 mg/dL (ref 8.6–10.3)
Chloride: 105 mmol/L (ref 98–110)
Creat: 1.16 mg/dL (ref 0.70–1.33)
Globulin: 2.9 g/dL (calc) (ref 1.9–3.7)
Glucose, Bld: 97 mg/dL (ref 65–139)
Potassium: 5.2 mmol/L (ref 3.5–5.3)
Sodium: 139 mmol/L (ref 135–146)
Total Bilirubin: 0.4 mg/dL (ref 0.2–1.2)
Total Protein: 7.4 g/dL (ref 6.1–8.1)

## 2019-04-03 LAB — HEPATITIS C RNA QUANTITATIVE
HCV Quantitative Log: <1.18 Log IU/mL
HCV RNA, PCR, QN: <15 IU/mL

## 2019-04-04 ENCOUNTER — Other Ambulatory Visit (INDEPENDENT_AMBULATORY_CARE_PROVIDER_SITE_OTHER): Payer: Self-pay | Admitting: Nurse Practitioner

## 2019-04-04 ENCOUNTER — Telehealth (INDEPENDENT_AMBULATORY_CARE_PROVIDER_SITE_OTHER): Payer: Self-pay | Admitting: Nurse Practitioner

## 2019-04-04 DIAGNOSIS — Z8619 Personal history of other infectious and parasitic diseases: Secondary | ICD-10-CM

## 2019-04-04 DIAGNOSIS — S61512D Laceration without foreign body of left wrist, subsequent encounter: Secondary | ICD-10-CM | POA: Diagnosis not present

## 2019-04-04 DIAGNOSIS — X58XXXD Exposure to other specified factors, subsequent encounter: Secondary | ICD-10-CM | POA: Diagnosis not present

## 2019-04-04 NOTE — Telephone Encounter (Signed)
Please call patient at [phone]

## 2019-04-06 NOTE — Telephone Encounter (Signed)
See other phone msg

## 2019-04-07 ENCOUNTER — Other Ambulatory Visit (INDEPENDENT_AMBULATORY_CARE_PROVIDER_SITE_OTHER): Payer: Self-pay | Admitting: *Deleted

## 2019-04-07 DIAGNOSIS — Z8619 Personal history of other infectious and parasitic diseases: Secondary | ICD-10-CM

## 2019-04-10 NOTE — Telephone Encounter (Signed)
Notes recorded by Worthy Keeler on 04/04/2019 at 11:51 AM EDT  [place] elastography sch'd 04/12/19 at 930 (915), npo after midnight, left detailed message for patient. Repeat Labs in 6 months, recall entered.

## 2019-04-12 ENCOUNTER — Ambulatory Visit (HOSPITAL_COMMUNITY)
Admission: RE | Admit: 2019-04-12 | Discharge: 2019-04-12 | Disposition: A | Discharge status: Home / Self Care | Payer: Medicare Other | Source: Ambulatory Visit | Attending: Nurse Practitioner | Admitting: Nurse Practitioner

## 2019-04-12 ENCOUNTER — Other Ambulatory Visit: Payer: Self-pay

## 2019-04-12 DIAGNOSIS — K388 Other specified diseases of appendix: Secondary | ICD-10-CM | POA: Diagnosis not present

## 2019-04-12 DIAGNOSIS — Z8619 Personal history of other infectious and parasitic diseases: Secondary | ICD-10-CM | POA: Diagnosis not present

## 2019-04-13 DIAGNOSIS — E039 Hypothyroidism, unspecified: Secondary | ICD-10-CM | POA: Diagnosis not present

## 2019-04-13 DIAGNOSIS — E7800 Pure hypercholesterolemia, unspecified: Secondary | ICD-10-CM | POA: Diagnosis not present

## 2019-04-13 DIAGNOSIS — I10 Essential (primary) hypertension: Secondary | ICD-10-CM | POA: Diagnosis not present

## 2019-04-13 DIAGNOSIS — E785 Hyperlipidemia, unspecified: Secondary | ICD-10-CM | POA: Diagnosis not present

## 2019-04-20 ENCOUNTER — Telehealth (INDEPENDENT_AMBULATORY_CARE_PROVIDER_SITE_OTHER): Payer: Self-pay | Admitting: Nurse Practitioner

## 2019-04-20 NOTE — Telephone Encounter (Signed)
6 mth [place] noted in recall Forwarded to Tammy for 6 mth labs

## 2019-04-20 NOTE — Telephone Encounter (Signed)
Jeremy Weeks can you please add lab and abdominal sonogram recall for 6 months

## 2019-04-28 NOTE — Telephone Encounter (Signed)
6 months labs are noted.

## 2019-05-14 DIAGNOSIS — I10 Essential (primary) hypertension: Secondary | ICD-10-CM | POA: Diagnosis not present

## 2019-05-14 DIAGNOSIS — E782 Mixed hyperlipidemia: Secondary | ICD-10-CM | POA: Diagnosis not present

## 2019-05-16 DIAGNOSIS — E063 Autoimmune thyroiditis: Secondary | ICD-10-CM | POA: Diagnosis not present

## 2019-05-16 DIAGNOSIS — I10 Essential (primary) hypertension: Secondary | ICD-10-CM | POA: Diagnosis not present

## 2019-05-16 DIAGNOSIS — G4700 Insomnia, unspecified: Secondary | ICD-10-CM | POA: Diagnosis not present

## 2019-05-16 DIAGNOSIS — G894 Chronic pain syndrome: Secondary | ICD-10-CM | POA: Diagnosis not present

## 2019-05-16 DIAGNOSIS — M1991 Primary osteoarthritis, unspecified site: Secondary | ICD-10-CM | POA: Diagnosis not present

## 2019-05-16 DIAGNOSIS — Z23 Encounter for immunization: Secondary | ICD-10-CM | POA: Diagnosis not present

## 2019-07-14 DIAGNOSIS — I10 Essential (primary) hypertension: Secondary | ICD-10-CM | POA: Diagnosis not present

## 2019-07-14 DIAGNOSIS — G894 Chronic pain syndrome: Secondary | ICD-10-CM | POA: Diagnosis not present

## 2019-07-18 DIAGNOSIS — E063 Autoimmune thyroiditis: Secondary | ICD-10-CM | POA: Diagnosis not present

## 2019-07-18 DIAGNOSIS — Z1389 Encounter for screening for other disorder: Secondary | ICD-10-CM | POA: Diagnosis not present

## 2019-07-18 DIAGNOSIS — E782 Mixed hyperlipidemia: Secondary | ICD-10-CM | POA: Diagnosis not present

## 2019-07-18 DIAGNOSIS — E7849 Other hyperlipidemia: Secondary | ICD-10-CM | POA: Diagnosis not present

## 2019-07-18 DIAGNOSIS — G4709 Other insomnia: Secondary | ICD-10-CM | POA: Diagnosis not present

## 2019-07-18 DIAGNOSIS — M1991 Primary osteoarthritis, unspecified site: Secondary | ICD-10-CM | POA: Diagnosis not present

## 2019-07-18 DIAGNOSIS — Z0001 Encounter for general adult medical examination with abnormal findings: Secondary | ICD-10-CM | POA: Diagnosis not present

## 2019-07-18 DIAGNOSIS — I10 Essential (primary) hypertension: Secondary | ICD-10-CM | POA: Diagnosis not present

## 2019-09-11 DIAGNOSIS — I10 Essential (primary) hypertension: Secondary | ICD-10-CM | POA: Diagnosis not present

## 2019-09-11 DIAGNOSIS — G894 Chronic pain syndrome: Secondary | ICD-10-CM | POA: Diagnosis not present

## 2019-09-19 DIAGNOSIS — G894 Chronic pain syndrome: Secondary | ICD-10-CM | POA: Diagnosis not present

## 2019-09-19 DIAGNOSIS — M1991 Primary osteoarthritis, unspecified site: Secondary | ICD-10-CM | POA: Diagnosis not present

## 2019-09-19 DIAGNOSIS — K7460 Unspecified cirrhosis of liver: Secondary | ICD-10-CM | POA: Diagnosis not present

## 2019-10-03 ENCOUNTER — Other Ambulatory Visit (INDEPENDENT_AMBULATORY_CARE_PROVIDER_SITE_OTHER): Payer: Self-pay | Admitting: *Deleted

## 2019-10-03 DIAGNOSIS — Z8619 Personal history of other infectious and parasitic diseases: Secondary | ICD-10-CM

## 2019-10-10 ENCOUNTER — Encounter (INDEPENDENT_AMBULATORY_CARE_PROVIDER_SITE_OTHER): Payer: Self-pay | Admitting: *Deleted

## 2019-10-12 DIAGNOSIS — I10 Essential (primary) hypertension: Secondary | ICD-10-CM | POA: Diagnosis not present

## 2019-10-12 DIAGNOSIS — G894 Chronic pain syndrome: Secondary | ICD-10-CM | POA: Diagnosis not present

## 2019-10-18 ENCOUNTER — Other Ambulatory Visit (INDEPENDENT_AMBULATORY_CARE_PROVIDER_SITE_OTHER): Payer: Self-pay | Admitting: *Deleted

## 2019-10-18 DIAGNOSIS — Z8619 Personal history of other infectious and parasitic diseases: Secondary | ICD-10-CM

## 2019-11-02 ENCOUNTER — Other Ambulatory Visit: Payer: Self-pay

## 2019-11-02 ENCOUNTER — Ambulatory Visit (HOSPITAL_COMMUNITY)
Admission: RE | Admit: 2019-11-02 | Discharge: 2019-11-02 | Disposition: A | Discharge status: Home / Self Care | Payer: Medicare Other | Source: Ambulatory Visit | Attending: Internal Medicine | Admitting: Internal Medicine

## 2019-11-02 DIAGNOSIS — Z8619 Personal history of other infectious and parasitic diseases: Secondary | ICD-10-CM | POA: Insufficient documentation

## 2019-11-02 DIAGNOSIS — N281 Cyst of kidney, acquired: Secondary | ICD-10-CM | POA: Diagnosis not present

## 2019-11-04 LAB — CBC WITH DIFFERENTIAL/PLATELET
Absolute Monocytes: 480 cells/uL (ref 200–950)
Basophils Absolute: 60 cells/uL (ref 0–200)
Basophils Relative: 1 %
Eosinophils Absolute: 270 cells/uL (ref 15–500)
Eosinophils Relative: 4.5 %
HCT: 41.8 % (ref 38.5–50.0)
Hemoglobin: 14 g/dL (ref 13.2–17.1)
Lymphs Abs: 1704 cells/uL (ref 850–3900)
MCH: 31.5 pg (ref 27.0–33.0)
MCHC: 33.5 g/dL (ref 32.0–36.0)
MCV: 94.1 fL (ref 80.0–100.0)
MPV: 13.4 fL — ABNORMAL HIGH (ref 7.5–12.5)
Monocytes Relative: 8 %
Neutro Abs: 3486 cells/uL (ref 1500–7800)
Neutrophils Relative %: 58.1 %
Platelets: 94 10*3/uL — ABNORMAL LOW (ref 140–400)
RBC: 4.44 10*6/uL (ref 4.20–5.80)
RDW: 12 % (ref 11.0–15.0)
Total Lymphocyte: 28.4 %
WBC: 6 10*3/uL (ref 3.8–10.8)

## 2019-11-04 LAB — COMPREHENSIVE METABOLIC PANEL
AG Ratio: 1.5 (calc) (ref 1.0–2.5)
ALT: 16 U/L (ref 9–46)
AST: 21 U/L (ref 10–35)
Albumin: 4.5 g/dL (ref 3.6–5.1)
Alkaline phosphatase (APISO): 65 U/L (ref 35–144)
BUN: 18 mg/dL (ref 7–25)
CO2: 31 mmol/L (ref 20–32)
Calcium: 10.3 mg/dL (ref 8.6–10.3)
Chloride: 100 mmol/L (ref 98–110)
Creat: 1.2 mg/dL (ref 0.70–1.33)
Globulin: 3 g/dL (calc) (ref 1.9–3.7)
Glucose, Bld: 106 mg/dL — ABNORMAL HIGH (ref 65–99)
Potassium: 4.9 mmol/L (ref 3.5–5.3)
Sodium: 138 mmol/L (ref 135–146)
Total Bilirubin: 0.5 mg/dL (ref 0.2–1.2)
Total Protein: 7.5 g/dL (ref 6.1–8.1)

## 2019-11-04 LAB — HEPATITIS C RNA QUANTITATIVE
HCV Quantitative Log: <1.18 Log IU/mL
HCV RNA, PCR, QN: <15 IU/mL

## 2019-11-14 DIAGNOSIS — I10 Essential (primary) hypertension: Secondary | ICD-10-CM | POA: Diagnosis not present

## 2019-11-14 DIAGNOSIS — D696 Thrombocytopenia, unspecified: Secondary | ICD-10-CM | POA: Diagnosis not present

## 2019-11-14 DIAGNOSIS — K7460 Unspecified cirrhosis of liver: Secondary | ICD-10-CM | POA: Diagnosis not present

## 2019-11-14 DIAGNOSIS — M1991 Primary osteoarthritis, unspecified site: Secondary | ICD-10-CM | POA: Diagnosis not present

## 2019-11-14 DIAGNOSIS — G894 Chronic pain syndrome: Secondary | ICD-10-CM | POA: Diagnosis not present

## 2019-11-14 DIAGNOSIS — E063 Autoimmune thyroiditis: Secondary | ICD-10-CM | POA: Diagnosis not present

## 2019-12-12 DIAGNOSIS — I10 Essential (primary) hypertension: Secondary | ICD-10-CM | POA: Diagnosis not present

## 2019-12-12 DIAGNOSIS — G894 Chronic pain syndrome: Secondary | ICD-10-CM | POA: Diagnosis not present

## 2019-12-26 DIAGNOSIS — K7460 Unspecified cirrhosis of liver: Secondary | ICD-10-CM | POA: Diagnosis not present

## 2019-12-26 DIAGNOSIS — G894 Chronic pain syndrome: Secondary | ICD-10-CM | POA: Diagnosis not present

## 2019-12-26 DIAGNOSIS — E039 Hypothyroidism, unspecified: Secondary | ICD-10-CM | POA: Diagnosis not present

## 2019-12-26 DIAGNOSIS — D696 Thrombocytopenia, unspecified: Secondary | ICD-10-CM | POA: Diagnosis not present

## 2020-02-14 DIAGNOSIS — I10 Essential (primary) hypertension: Secondary | ICD-10-CM | POA: Diagnosis not present

## 2020-02-14 DIAGNOSIS — E063 Autoimmune thyroiditis: Secondary | ICD-10-CM | POA: Diagnosis not present

## 2020-02-14 DIAGNOSIS — E7849 Other hyperlipidemia: Secondary | ICD-10-CM | POA: Diagnosis not present

## 2020-02-14 DIAGNOSIS — G894 Chronic pain syndrome: Secondary | ICD-10-CM | POA: Diagnosis not present

## 2020-03-01 DIAGNOSIS — Z1211 Encounter for screening for malignant neoplasm of colon: Secondary | ICD-10-CM | POA: Diagnosis not present

## 2020-03-15 DIAGNOSIS — M5136 Other intervertebral disc degeneration, lumbar region: Secondary | ICD-10-CM | POA: Diagnosis not present

## 2020-03-15 DIAGNOSIS — G894 Chronic pain syndrome: Secondary | ICD-10-CM | POA: Diagnosis not present

## 2020-03-15 DIAGNOSIS — R609 Edema, unspecified: Secondary | ICD-10-CM | POA: Diagnosis not present

## 2020-04-12 DIAGNOSIS — I10 Essential (primary) hypertension: Secondary | ICD-10-CM | POA: Diagnosis not present

## 2020-04-12 DIAGNOSIS — G894 Chronic pain syndrome: Secondary | ICD-10-CM | POA: Diagnosis not present

## 2020-04-16 DIAGNOSIS — M1991 Primary osteoarthritis, unspecified site: Secondary | ICD-10-CM | POA: Diagnosis not present

## 2020-04-16 DIAGNOSIS — E063 Autoimmune thyroiditis: Secondary | ICD-10-CM | POA: Diagnosis not present

## 2020-04-16 DIAGNOSIS — I10 Essential (primary) hypertension: Secondary | ICD-10-CM | POA: Diagnosis not present

## 2020-04-16 DIAGNOSIS — G894 Chronic pain syndrome: Secondary | ICD-10-CM | POA: Diagnosis not present

## 2020-04-16 DIAGNOSIS — Z23 Encounter for immunization: Secondary | ICD-10-CM | POA: Diagnosis not present

## 2020-05-12 DIAGNOSIS — I10 Essential (primary) hypertension: Secondary | ICD-10-CM | POA: Diagnosis not present

## 2020-05-12 DIAGNOSIS — G894 Chronic pain syndrome: Secondary | ICD-10-CM | POA: Diagnosis not present

## 2020-05-17 DIAGNOSIS — K921 Melena: Secondary | ICD-10-CM | POA: Diagnosis not present

## 2020-05-17 DIAGNOSIS — M5136 Other intervertebral disc degeneration, lumbar region: Secondary | ICD-10-CM | POA: Diagnosis not present

## 2020-05-17 DIAGNOSIS — G894 Chronic pain syndrome: Secondary | ICD-10-CM | POA: Diagnosis not present

## 2020-06-15 DIAGNOSIS — G894 Chronic pain syndrome: Secondary | ICD-10-CM | POA: Diagnosis not present

## 2020-06-15 DIAGNOSIS — I10 Essential (primary) hypertension: Secondary | ICD-10-CM | POA: Diagnosis not present

## 2020-06-15 DIAGNOSIS — M5416 Radiculopathy, lumbar region: Secondary | ICD-10-CM | POA: Diagnosis not present

## 2020-06-15 DIAGNOSIS — M1991 Primary osteoarthritis, unspecified site: Secondary | ICD-10-CM | POA: Diagnosis not present

## 2020-06-15 DIAGNOSIS — M13872 Other specified arthritis, left ankle and foot: Secondary | ICD-10-CM | POA: Diagnosis not present

## 2020-07-13 DIAGNOSIS — G894 Chronic pain syndrome: Secondary | ICD-10-CM | POA: Diagnosis not present

## 2020-07-13 DIAGNOSIS — I10 Essential (primary) hypertension: Secondary | ICD-10-CM | POA: Diagnosis not present

## 2020-07-16 DIAGNOSIS — I10 Essential (primary) hypertension: Secondary | ICD-10-CM | POA: Diagnosis not present

## 2020-07-16 DIAGNOSIS — G894 Chronic pain syndrome: Secondary | ICD-10-CM | POA: Diagnosis not present

## 2020-07-16 DIAGNOSIS — Z0000 Encounter for general adult medical examination without abnormal findings: Secondary | ICD-10-CM | POA: Diagnosis not present

## 2020-07-16 DIAGNOSIS — Z1389 Encounter for screening for other disorder: Secondary | ICD-10-CM | POA: Diagnosis not present

## 2020-07-16 DIAGNOSIS — G4709 Other insomnia: Secondary | ICD-10-CM | POA: Diagnosis not present

## 2020-08-11 DIAGNOSIS — I10 Essential (primary) hypertension: Secondary | ICD-10-CM | POA: Diagnosis not present

## 2020-08-11 DIAGNOSIS — G894 Chronic pain syndrome: Secondary | ICD-10-CM | POA: Diagnosis not present

## 2020-08-16 DIAGNOSIS — B070 Plantar wart: Secondary | ICD-10-CM | POA: Diagnosis not present

## 2020-08-16 DIAGNOSIS — G894 Chronic pain syndrome: Secondary | ICD-10-CM | POA: Diagnosis not present

## 2020-08-16 DIAGNOSIS — I872 Venous insufficiency (chronic) (peripheral): Secondary | ICD-10-CM | POA: Diagnosis not present

## 2020-08-16 DIAGNOSIS — I10 Essential (primary) hypertension: Secondary | ICD-10-CM | POA: Diagnosis not present

## 2020-09-13 DIAGNOSIS — M1991 Primary osteoarthritis, unspecified site: Secondary | ICD-10-CM | POA: Diagnosis not present

## 2020-09-13 DIAGNOSIS — G894 Chronic pain syndrome: Secondary | ICD-10-CM | POA: Diagnosis not present

## 2020-09-13 DIAGNOSIS — I10 Essential (primary) hypertension: Secondary | ICD-10-CM | POA: Diagnosis not present

## 2020-09-13 DIAGNOSIS — G4700 Insomnia, unspecified: Secondary | ICD-10-CM | POA: Diagnosis not present

## 2020-09-20 DIAGNOSIS — Q828 Other specified congenital malformations of skin: Secondary | ICD-10-CM | POA: Diagnosis not present

## 2020-09-20 DIAGNOSIS — M79672 Pain in left foot: Secondary | ICD-10-CM | POA: Diagnosis not present

## 2020-10-10 DIAGNOSIS — G894 Chronic pain syndrome: Secondary | ICD-10-CM | POA: Diagnosis not present

## 2020-11-06 DIAGNOSIS — F17290 Nicotine dependence, other tobacco product, uncomplicated: Secondary | ICD-10-CM | POA: Diagnosis not present

## 2020-11-06 DIAGNOSIS — E7849 Other hyperlipidemia: Secondary | ICD-10-CM | POA: Diagnosis not present

## 2020-11-06 DIAGNOSIS — I10 Essential (primary) hypertension: Secondary | ICD-10-CM | POA: Diagnosis not present

## 2020-11-06 DIAGNOSIS — G894 Chronic pain syndrome: Secondary | ICD-10-CM | POA: Diagnosis not present

## 2020-11-10 DIAGNOSIS — I10 Essential (primary) hypertension: Secondary | ICD-10-CM | POA: Diagnosis not present

## 2020-11-10 DIAGNOSIS — G894 Chronic pain syndrome: Secondary | ICD-10-CM | POA: Diagnosis not present

## 2020-12-06 DIAGNOSIS — G894 Chronic pain syndrome: Secondary | ICD-10-CM | POA: Diagnosis not present

## 2020-12-06 DIAGNOSIS — M1991 Primary osteoarthritis, unspecified site: Secondary | ICD-10-CM | POA: Diagnosis not present

## 2020-12-06 DIAGNOSIS — I10 Essential (primary) hypertension: Secondary | ICD-10-CM | POA: Diagnosis not present

## 2021-01-02 DIAGNOSIS — M5136 Other intervertebral disc degeneration, lumbar region: Secondary | ICD-10-CM | POA: Diagnosis not present

## 2021-01-02 DIAGNOSIS — G4709 Other insomnia: Secondary | ICD-10-CM | POA: Diagnosis not present

## 2021-01-02 DIAGNOSIS — M1991 Primary osteoarthritis, unspecified site: Secondary | ICD-10-CM | POA: Diagnosis not present

## 2021-01-02 DIAGNOSIS — T402X5D Adverse effect of other opioids, subsequent encounter: Secondary | ICD-10-CM | POA: Diagnosis not present

## 2021-01-02 DIAGNOSIS — G894 Chronic pain syndrome: Secondary | ICD-10-CM | POA: Diagnosis not present

## 2021-01-17 ENCOUNTER — Other Ambulatory Visit (HOSPITAL_COMMUNITY): Payer: Self-pay | Admitting: Internal Medicine

## 2021-01-17 ENCOUNTER — Other Ambulatory Visit: Payer: Self-pay

## 2021-01-17 ENCOUNTER — Ambulatory Visit (HOSPITAL_COMMUNITY)
Admission: RE | Admit: 2021-01-17 | Discharge: 2021-01-17 | Disposition: A | Discharge status: Home / Self Care | Payer: Medicare Other | Source: Ambulatory Visit | Attending: Internal Medicine | Admitting: Internal Medicine

## 2021-01-17 DIAGNOSIS — M79661 Pain in right lower leg: Secondary | ICD-10-CM

## 2021-01-17 DIAGNOSIS — M549 Dorsalgia, unspecified: Secondary | ICD-10-CM

## 2021-01-17 DIAGNOSIS — M545 Low back pain, unspecified: Secondary | ICD-10-CM | POA: Diagnosis not present

## 2021-01-17 DIAGNOSIS — M48061 Spinal stenosis, lumbar region without neurogenic claudication: Secondary | ICD-10-CM | POA: Diagnosis not present

## 2021-01-17 DIAGNOSIS — I10 Essential (primary) hypertension: Secondary | ICD-10-CM | POA: Diagnosis not present

## 2021-01-17 DIAGNOSIS — E063 Autoimmune thyroiditis: Secondary | ICD-10-CM | POA: Diagnosis not present

## 2021-01-17 DIAGNOSIS — M79605 Pain in left leg: Secondary | ICD-10-CM

## 2021-01-17 DIAGNOSIS — G894 Chronic pain syndrome: Secondary | ICD-10-CM | POA: Diagnosis not present

## 2021-01-18 DIAGNOSIS — E039 Hypothyroidism, unspecified: Secondary | ICD-10-CM | POA: Diagnosis not present

## 2021-01-31 DIAGNOSIS — G894 Chronic pain syndrome: Secondary | ICD-10-CM | POA: Diagnosis not present

## 2021-01-31 DIAGNOSIS — I10 Essential (primary) hypertension: Secondary | ICD-10-CM | POA: Diagnosis not present

## 2021-01-31 DIAGNOSIS — M1991 Primary osteoarthritis, unspecified site: Secondary | ICD-10-CM | POA: Diagnosis not present

## 2021-01-31 DIAGNOSIS — E063 Autoimmune thyroiditis: Secondary | ICD-10-CM | POA: Diagnosis not present

## 2021-02-04 DIAGNOSIS — M47816 Spondylosis without myelopathy or radiculopathy, lumbar region: Secondary | ICD-10-CM | POA: Diagnosis not present

## 2021-02-04 DIAGNOSIS — Z981 Arthrodesis status: Secondary | ICD-10-CM | POA: Diagnosis not present

## 2021-02-04 DIAGNOSIS — M5416 Radiculopathy, lumbar region: Secondary | ICD-10-CM | POA: Diagnosis not present

## 2021-02-22 DIAGNOSIS — I10 Essential (primary) hypertension: Secondary | ICD-10-CM | POA: Diagnosis not present

## 2021-02-22 DIAGNOSIS — G894 Chronic pain syndrome: Secondary | ICD-10-CM | POA: Diagnosis not present

## 2021-03-21 DIAGNOSIS — I10 Essential (primary) hypertension: Secondary | ICD-10-CM | POA: Diagnosis not present

## 2021-03-21 DIAGNOSIS — M1991 Primary osteoarthritis, unspecified site: Secondary | ICD-10-CM | POA: Diagnosis not present

## 2021-03-21 DIAGNOSIS — G894 Chronic pain syndrome: Secondary | ICD-10-CM | POA: Diagnosis not present

## 2021-04-17 DIAGNOSIS — M1991 Primary osteoarthritis, unspecified site: Secondary | ICD-10-CM | POA: Diagnosis not present

## 2021-04-17 DIAGNOSIS — M5136 Other intervertebral disc degeneration, lumbar region: Secondary | ICD-10-CM | POA: Diagnosis not present

## 2021-04-17 DIAGNOSIS — G894 Chronic pain syndrome: Secondary | ICD-10-CM | POA: Diagnosis not present

## 2021-04-17 DIAGNOSIS — G4709 Other insomnia: Secondary | ICD-10-CM | POA: Diagnosis not present

## 2021-05-13 DIAGNOSIS — I10 Essential (primary) hypertension: Secondary | ICD-10-CM | POA: Diagnosis not present

## 2021-05-13 DIAGNOSIS — G894 Chronic pain syndrome: Secondary | ICD-10-CM | POA: Diagnosis not present

## 2021-05-17 DIAGNOSIS — M1991 Primary osteoarthritis, unspecified site: Secondary | ICD-10-CM | POA: Diagnosis not present

## 2021-05-17 DIAGNOSIS — E063 Autoimmune thyroiditis: Secondary | ICD-10-CM | POA: Diagnosis not present

## 2021-05-17 DIAGNOSIS — I10 Essential (primary) hypertension: Secondary | ICD-10-CM | POA: Diagnosis not present

## 2021-05-17 DIAGNOSIS — Z23 Encounter for immunization: Secondary | ICD-10-CM | POA: Diagnosis not present

## 2021-05-17 DIAGNOSIS — G894 Chronic pain syndrome: Secondary | ICD-10-CM | POA: Diagnosis not present

## 2021-05-17 DIAGNOSIS — M5136 Other intervertebral disc degeneration, lumbar region: Secondary | ICD-10-CM | POA: Diagnosis not present

## 2021-06-10 DIAGNOSIS — I10 Essential (primary) hypertension: Secondary | ICD-10-CM | POA: Diagnosis not present

## 2021-06-10 DIAGNOSIS — E063 Autoimmune thyroiditis: Secondary | ICD-10-CM | POA: Diagnosis not present

## 2021-06-10 DIAGNOSIS — G894 Chronic pain syndrome: Secondary | ICD-10-CM | POA: Diagnosis not present

## 2021-06-10 DIAGNOSIS — M1991 Primary osteoarthritis, unspecified site: Secondary | ICD-10-CM | POA: Diagnosis not present

## 2021-07-05 DIAGNOSIS — M5136 Other intervertebral disc degeneration, lumbar region: Secondary | ICD-10-CM | POA: Diagnosis not present

## 2021-07-05 DIAGNOSIS — I10 Essential (primary) hypertension: Secondary | ICD-10-CM | POA: Diagnosis not present

## 2021-07-05 DIAGNOSIS — G894 Chronic pain syndrome: Secondary | ICD-10-CM | POA: Diagnosis not present

## 2021-07-05 DIAGNOSIS — M1991 Primary osteoarthritis, unspecified site: Secondary | ICD-10-CM | POA: Diagnosis not present

## 2021-08-07 DIAGNOSIS — G894 Chronic pain syndrome: Secondary | ICD-10-CM | POA: Diagnosis not present

## 2021-08-07 DIAGNOSIS — M1991 Primary osteoarthritis, unspecified site: Secondary | ICD-10-CM | POA: Diagnosis not present

## 2021-08-07 DIAGNOSIS — I10 Essential (primary) hypertension: Secondary | ICD-10-CM | POA: Diagnosis not present

## 2021-08-07 DIAGNOSIS — E063 Autoimmune thyroiditis: Secondary | ICD-10-CM | POA: Diagnosis not present

## 2021-08-13 DIAGNOSIS — G894 Chronic pain syndrome: Secondary | ICD-10-CM | POA: Diagnosis not present

## 2021-08-13 DIAGNOSIS — I10 Essential (primary) hypertension: Secondary | ICD-10-CM | POA: Diagnosis not present

## 2021-08-27 DIAGNOSIS — I10 Essential (primary) hypertension: Secondary | ICD-10-CM | POA: Diagnosis not present

## 2021-08-27 DIAGNOSIS — M5136 Other intervertebral disc degeneration, lumbar region: Secondary | ICD-10-CM | POA: Diagnosis not present

## 2021-08-27 DIAGNOSIS — G894 Chronic pain syndrome: Secondary | ICD-10-CM | POA: Diagnosis not present

## 2021-08-27 DIAGNOSIS — E063 Autoimmune thyroiditis: Secondary | ICD-10-CM | POA: Diagnosis not present

## 2021-08-27 DIAGNOSIS — M1991 Primary osteoarthritis, unspecified site: Secondary | ICD-10-CM | POA: Diagnosis not present

## 2021-09-13 ENCOUNTER — Other Ambulatory Visit: Payer: Self-pay

## 2021-09-13 ENCOUNTER — Ambulatory Visit (HOSPITAL_COMMUNITY)
Admission: RE | Admit: 2021-09-13 | Discharge: 2021-09-13 | Disposition: A | Discharge status: Home / Self Care | Payer: Medicare Other | Source: Ambulatory Visit | Attending: Internal Medicine | Admitting: Internal Medicine

## 2021-09-13 DIAGNOSIS — M79661 Pain in right lower leg: Secondary | ICD-10-CM | POA: Diagnosis not present

## 2021-09-13 DIAGNOSIS — R600 Localized edema: Secondary | ICD-10-CM | POA: Diagnosis not present

## 2021-09-13 DIAGNOSIS — M79605 Pain in left leg: Secondary | ICD-10-CM

## 2021-10-07 DIAGNOSIS — M5136 Other intervertebral disc degeneration, lumbar region: Secondary | ICD-10-CM | POA: Diagnosis not present

## 2021-10-07 DIAGNOSIS — I10 Essential (primary) hypertension: Secondary | ICD-10-CM | POA: Diagnosis not present

## 2021-10-07 DIAGNOSIS — M1991 Primary osteoarthritis, unspecified site: Secondary | ICD-10-CM | POA: Diagnosis not present

## 2021-10-07 DIAGNOSIS — G894 Chronic pain syndrome: Secondary | ICD-10-CM | POA: Diagnosis not present

## 2021-10-30 DIAGNOSIS — G894 Chronic pain syndrome: Secondary | ICD-10-CM | POA: Diagnosis not present

## 2021-10-30 DIAGNOSIS — E063 Autoimmune thyroiditis: Secondary | ICD-10-CM | POA: Diagnosis not present

## 2021-10-30 DIAGNOSIS — I10 Essential (primary) hypertension: Secondary | ICD-10-CM | POA: Diagnosis not present

## 2021-10-30 DIAGNOSIS — M1991 Primary osteoarthritis, unspecified site: Secondary | ICD-10-CM | POA: Diagnosis not present

## 2021-11-08 ENCOUNTER — Emergency Department (HOSPITAL_COMMUNITY)
Admission: EM | Admit: 2021-11-08 | Discharge: 2021-11-08 | Disposition: A | Discharge status: Home / Self Care | Payer: Medicare Other | Attending: Emergency Medicine | Admitting: Emergency Medicine

## 2021-11-08 ENCOUNTER — Encounter (HOSPITAL_COMMUNITY): Payer: Self-pay | Admitting: *Deleted

## 2021-11-08 ENCOUNTER — Other Ambulatory Visit: Payer: Self-pay

## 2021-11-08 DIAGNOSIS — E039 Hypothyroidism, unspecified: Secondary | ICD-10-CM | POA: Diagnosis not present

## 2021-11-08 DIAGNOSIS — Z7982 Long term (current) use of aspirin: Secondary | ICD-10-CM | POA: Diagnosis not present

## 2021-11-08 DIAGNOSIS — B029 Zoster without complications: Secondary | ICD-10-CM | POA: Diagnosis not present

## 2021-11-08 DIAGNOSIS — R21 Rash and other nonspecific skin eruption: Secondary | ICD-10-CM | POA: Diagnosis present

## 2021-11-08 MED ORDER — VALACYCLOVIR HCL 1 G PO TABS: 1000.0000 mg | ORAL_TABLET | Freq: Three times a day (TID) | ORAL | 0 refills | Status: AC

## 2021-11-08 MED ORDER — DEXAMETHASONE SODIUM PHOSPHATE 10 MG/ML IJ SOLN
10.0000 mg | Freq: Once | INTRAMUSCULAR | Status: AC
  Administered 2021-11-08: 10 mg via INTRAMUSCULAR
  Filled 2021-11-08: qty 1

## 2021-11-08 NOTE — ED Triage Notes (Signed)
Pt with cluster of blisters to behind of left knee that itch and painful.  Area also to above left ankle and bottom of left foot.  ?

## 2021-11-08 NOTE — ED Notes (Addendum)
Patient educated on need to stay for monitoring post IM injection. Patient stated that he felt like he would be ok and wanted to leave. Patient educated on adverse reactions. Patient verbalized understanding of discharge instructions.  ?

## 2021-11-08 NOTE — ED Provider Notes (Signed)
?[facility] EMERGENCY DEPARTMENT ?Provider Note ? ? ?CSN: [id number] ?Arrival date & time: 11/08/21  1138 ? ?  ? ?History ? ?Chief Complaint  ?Patient presents with  ? Rash  ? ? ?Jeremy Weeks is a 61 y.o. male. ? ?Pt is a 61 yo male with a pmhx significant for Hep C, hypothyroidism, thrombocytopenia, high chloesterol and chronic pain.  Pt said he thinks he has been bitten by "sugar ants."  Pt has noticed some spots behind his left knee and to his left foot and buttock.  The spots are itchy and painful.  The pt denies any f/c. ? ? ?  ? ?Home Medications ?Prior to Admission medications   ?Medication Sig Start Date End Date Taking? Authorizing Provider  ?valACYclovir (VALTREX) 1000 MG tablet Take 1 tablet (1,000 mg total) by mouth 3 (three) times daily for 7 days. 11/08/21 11/15/21 Yes Jacalyn Lefevre, MD  ?aspirin EC 81 MG tablet Take 81 mg by mouth daily.     [provider]  ?atorvastatin (LIPITOR) 20 MG tablet Take 20 mg by mouth daily.    [provider]  ?busPIRone (BUSPAR) 15 MG tablet Take 15 mg by mouth 2 (two) times daily.    [provider]  ?Cyanocobalamin (VITAMIN B-12 PO) Take 1 tablet by mouth daily.    [provider]  ?levothyroxine (SYNTHROID, LEVOTHROID) 125 MCG tablet Take 1 tablet (125 mcg total) daily before breakfast by mouth. ?Patient taking differently: Take 137 mcg by mouth daily before breakfast.  05/19/17   Langston Reusing, MD  ?linaclotide [facility]) 145 MCG CAPS capsule Take 145 mcg by mouth daily before breakfast.    [provider]  ?Multiple Vitamin (MULTIVITAMIN) tablet Take 1 tablet by mouth daily.    [provider]  ?Oxycodone HCl 20 MG TABS Take by mouth. Per patient he takes 1 by mouth every 4 hours as needed for pain.    [provider]  ?traZODone (DESYREL) 100 MG tablet TAKE ONE TABLET BY MOUTH AT BEDTIME AS NEEDED. 01/16/16   [provider]  ?zolpidem (AMBIEN) 10 MG tablet Take 10 mg by mouth at bedtime.     [provider]  ?   ? ?Allergies    ?Codeine   ? ?Review of Systems   ?Review of Systems  ?Skin:  Positive for rash.  ?All other systems reviewed and are negative. ? ?Physical Exam ?Updated Vital Signs ?BP 136/90 (BP Location: Right Arm)   Pulse 86   Temp 97.9 ?F (36.6 ?C) (Oral)   Resp 18   Ht 6' (1.829 m)   Wt 100.7 kg   SpO2 98%   BMI 30.11 kg/m?  ?Physical Exam ?Vitals and nursing note reviewed.  ?Constitutional:   ?   Appearance: Normal appearance.  ?HENT:  ?   Head: Normocephalic and atraumatic.  ?   Right Ear: External ear normal.  ?   Left Ear: External ear normal.  ?   Nose: Nose normal.  ?   Mouth/Throat:  ?   Mouth: Mucous membranes are moist.  ?   Pharynx: Oropharynx is clear.  ?Eyes:  ?   Extraocular Movements: Extraocular movements intact.  ?   Conjunctiva/sclera: Conjunctivae normal.  ?   Pupils: Pupils are equal, round, and reactive to light.  ?Cardiovascular:  ?   Rate and Rhythm: Normal rate and regular rhythm.  ?   Pulses: Normal pulses.  ?   Heart sounds: Normal heart sounds.  ?Pulmonary:  ?  Effort: Pulmonary effort is normal.  ?   Breath sounds: Normal breath sounds.  ?Abdominal:  ?   General: Abdomen is flat. Bowel sounds are normal.  ?   Palpations: Abdomen is soft.  ?Musculoskeletal:     ?   General: Normal range of motion.  ?   Cervical back: Normal range of motion and neck supple.  ?Skin: ?   Comments: Definite shingles outbreak posterior knee; other areas do look like insect bites.  ?Neurological:  ?   Mental Status: He is alert.  ? ? ?ED Results / Procedures / Treatments   ?Labs ?(all labs ordered are listed, but only abnormal results are displayed) ?Labs Reviewed - No data to display ? ?EKG ?None ? ?Radiology ?No results found. ? ?Procedures ?Procedures  ? ? ?Medications Ordered in ED ?Medications  ?dexamethasone (DECADRON) injection 10 mg (10 mg Intramuscular Given 11/08/21 1211)  ? ? ?ED Course/ Medical Decision Making/ A&P ?  ?                        ?Medical  Decision Making ?Risk ?Prescription drug management. ? ? ?This patient presents to the ED for concern of rash, this involves an extensive number of treatment options, and is a complaint that carries with it a high risk of complications and morbidity.  The differential diagnosis includes shingles, dermatitis, insect bites ? ? ?Co morbidities that complicate the patient evaluation ? ?Hep C, hypothyroidism, thrombocytopenia, high chloesterol and chronic pain ? ? ?Additional history obtained: ? ?Additional history obtained from epic chart review ? ?Medicines ordered and prescription drug management: ? ?I ordered medication including decadron  for itch  ?Reevaluation of the patient after these medicines showed that the patient stayed the same ?I have reviewed the patients home medicines and have made adjustments as needed ? ?Problem List / ED Course: ? ?Shingles:  pt will be started on valtrex.  He had a rx filled on 4/19 for 180 oxycodone 20s and a rx filled on 4/22 for 30 oxy 30s.  He can take his normal pain meds for the pain.  The steroid will help with the itching and potentially the shingles. ? ? ?Reevaluation: ? ?After the interventions noted above, I reevaluated the patient and found that they have :improved ? ? ?Social Determinants of Health: ? ?Lives at home ? ? ?Dispostion: ? ?After consideration of the diagnostic results and the patients response to treatment, I feel that the patent would benefit from discharge with outpatient f/u.   ? ? ? ? ? ? ? ?Final Clinical Impression(s) / ED Diagnoses ?Final diagnoses:  ?Herpes zoster without complication  ? ? ?Rx / DC Orders ?ED Discharge Orders   ? ?      Ordered  ?  valACYclovir (VALTREX) 1000 MG tablet  3 times daily       ? 11/08/21 1207  ? ?  ?  ? ?  ? ? ?  ?Isla Pence, MD ?11/08/21 1215 ? ?

## 2021-11-28 DIAGNOSIS — M1991 Primary osteoarthritis, unspecified site: Secondary | ICD-10-CM | POA: Diagnosis not present

## 2021-11-28 DIAGNOSIS — I10 Essential (primary) hypertension: Secondary | ICD-10-CM | POA: Diagnosis not present

## 2021-11-28 DIAGNOSIS — E782 Mixed hyperlipidemia: Secondary | ICD-10-CM | POA: Diagnosis not present

## 2021-11-28 DIAGNOSIS — M5136 Other intervertebral disc degeneration, lumbar region: Secondary | ICD-10-CM | POA: Diagnosis not present

## 2021-11-28 DIAGNOSIS — G894 Chronic pain syndrome: Secondary | ICD-10-CM | POA: Diagnosis not present

## 2021-12-11 DIAGNOSIS — G894 Chronic pain syndrome: Secondary | ICD-10-CM | POA: Diagnosis not present

## 2021-12-11 DIAGNOSIS — I10 Essential (primary) hypertension: Secondary | ICD-10-CM | POA: Diagnosis not present

## 2021-12-26 DIAGNOSIS — M1991 Primary osteoarthritis, unspecified site: Secondary | ICD-10-CM | POA: Diagnosis not present

## 2021-12-26 DIAGNOSIS — I10 Essential (primary) hypertension: Secondary | ICD-10-CM | POA: Diagnosis not present

## 2021-12-26 DIAGNOSIS — M5136 Other intervertebral disc degeneration, lumbar region: Secondary | ICD-10-CM | POA: Diagnosis not present

## 2021-12-26 DIAGNOSIS — S0083XA Contusion of other part of head, initial encounter: Secondary | ICD-10-CM | POA: Diagnosis not present

## 2021-12-26 DIAGNOSIS — E063 Autoimmune thyroiditis: Secondary | ICD-10-CM | POA: Diagnosis not present

## 2021-12-26 DIAGNOSIS — G894 Chronic pain syndrome: Secondary | ICD-10-CM | POA: Diagnosis not present

## 2021-12-26 DIAGNOSIS — Z0001 Encounter for general adult medical examination with abnormal findings: Secondary | ICD-10-CM | POA: Diagnosis not present

## 2021-12-26 DIAGNOSIS — E559 Vitamin D deficiency, unspecified: Secondary | ICD-10-CM | POA: Diagnosis not present

## 2022-01-23 DIAGNOSIS — M5136 Other intervertebral disc degeneration, lumbar region: Secondary | ICD-10-CM | POA: Diagnosis not present

## 2022-01-23 DIAGNOSIS — M1991 Primary osteoarthritis, unspecified site: Secondary | ICD-10-CM | POA: Diagnosis not present

## 2022-01-23 DIAGNOSIS — I10 Essential (primary) hypertension: Secondary | ICD-10-CM | POA: Diagnosis not present

## 2022-01-23 DIAGNOSIS — G894 Chronic pain syndrome: Secondary | ICD-10-CM | POA: Diagnosis not present

## 2022-01-23 DIAGNOSIS — E063 Autoimmune thyroiditis: Secondary | ICD-10-CM | POA: Diagnosis not present

## 2022-02-03 DIAGNOSIS — M5136 Other intervertebral disc degeneration, lumbar region: Secondary | ICD-10-CM | POA: Diagnosis not present

## 2022-02-03 DIAGNOSIS — E063 Autoimmune thyroiditis: Secondary | ICD-10-CM | POA: Diagnosis not present

## 2022-02-03 DIAGNOSIS — G894 Chronic pain syndrome: Secondary | ICD-10-CM | POA: Diagnosis not present

## 2022-02-03 DIAGNOSIS — M1991 Primary osteoarthritis, unspecified site: Secondary | ICD-10-CM | POA: Diagnosis not present

## 2022-02-03 DIAGNOSIS — I10 Essential (primary) hypertension: Secondary | ICD-10-CM | POA: Diagnosis not present

## 2022-02-17 DIAGNOSIS — M1991 Primary osteoarthritis, unspecified site: Secondary | ICD-10-CM | POA: Diagnosis not present

## 2022-02-17 DIAGNOSIS — G894 Chronic pain syndrome: Secondary | ICD-10-CM | POA: Diagnosis not present

## 2022-02-17 DIAGNOSIS — M5136 Other intervertebral disc degeneration, lumbar region: Secondary | ICD-10-CM | POA: Diagnosis not present

## 2022-02-17 DIAGNOSIS — I10 Essential (primary) hypertension: Secondary | ICD-10-CM | POA: Diagnosis not present

## 2022-03-19 DIAGNOSIS — M5136 Other intervertebral disc degeneration, lumbar region: Secondary | ICD-10-CM | POA: Diagnosis not present

## 2022-03-19 DIAGNOSIS — M1991 Primary osteoarthritis, unspecified site: Secondary | ICD-10-CM | POA: Diagnosis not present

## 2022-03-19 DIAGNOSIS — G894 Chronic pain syndrome: Secondary | ICD-10-CM | POA: Diagnosis not present

## 2022-03-19 DIAGNOSIS — I10 Essential (primary) hypertension: Secondary | ICD-10-CM | POA: Diagnosis not present

## 2022-04-16 DIAGNOSIS — M1991 Primary osteoarthritis, unspecified site: Secondary | ICD-10-CM | POA: Diagnosis not present

## 2022-04-16 DIAGNOSIS — G4709 Other insomnia: Secondary | ICD-10-CM | POA: Diagnosis not present

## 2022-04-16 DIAGNOSIS — M5136 Other intervertebral disc degeneration, lumbar region: Secondary | ICD-10-CM | POA: Diagnosis not present

## 2022-04-16 DIAGNOSIS — I10 Essential (primary) hypertension: Secondary | ICD-10-CM | POA: Diagnosis not present

## 2022-04-16 DIAGNOSIS — Z23 Encounter for immunization: Secondary | ICD-10-CM | POA: Diagnosis not present

## 2022-04-16 DIAGNOSIS — E063 Autoimmune thyroiditis: Secondary | ICD-10-CM | POA: Diagnosis not present

## 2022-04-16 DIAGNOSIS — G894 Chronic pain syndrome: Secondary | ICD-10-CM | POA: Diagnosis not present

## 2022-05-13 DIAGNOSIS — G894 Chronic pain syndrome: Secondary | ICD-10-CM | POA: Diagnosis not present

## 2022-05-13 DIAGNOSIS — E063 Autoimmune thyroiditis: Secondary | ICD-10-CM | POA: Diagnosis not present

## 2022-05-13 DIAGNOSIS — I10 Essential (primary) hypertension: Secondary | ICD-10-CM | POA: Diagnosis not present

## 2022-05-13 DIAGNOSIS — M1991 Primary osteoarthritis, unspecified site: Secondary | ICD-10-CM | POA: Diagnosis not present

## 2022-05-28 DIAGNOSIS — Z1211 Encounter for screening for malignant neoplasm of colon: Secondary | ICD-10-CM | POA: Diagnosis not present

## 2022-06-10 DIAGNOSIS — G894 Chronic pain syndrome: Secondary | ICD-10-CM | POA: Diagnosis not present

## 2022-06-10 DIAGNOSIS — M5136 Other intervertebral disc degeneration, lumbar region: Secondary | ICD-10-CM | POA: Diagnosis not present

## 2022-06-10 DIAGNOSIS — M1991 Primary osteoarthritis, unspecified site: Secondary | ICD-10-CM | POA: Diagnosis not present

## 2022-07-09 DIAGNOSIS — G894 Chronic pain syndrome: Secondary | ICD-10-CM | POA: Diagnosis not present

## 2022-07-09 DIAGNOSIS — M5136 Other intervertebral disc degeneration, lumbar region: Secondary | ICD-10-CM | POA: Diagnosis not present

## 2022-07-09 DIAGNOSIS — M1991 Primary osteoarthritis, unspecified site: Secondary | ICD-10-CM | POA: Diagnosis not present

## 2022-07-09 DIAGNOSIS — I10 Essential (primary) hypertension: Secondary | ICD-10-CM | POA: Diagnosis not present

## 2022-08-04 DIAGNOSIS — M1991 Primary osteoarthritis, unspecified site: Secondary | ICD-10-CM | POA: Diagnosis not present

## 2022-08-04 DIAGNOSIS — M5136 Other intervertebral disc degeneration, lumbar region: Secondary | ICD-10-CM | POA: Diagnosis not present

## 2022-08-04 DIAGNOSIS — G894 Chronic pain syndrome: Secondary | ICD-10-CM | POA: Diagnosis not present

## 2022-08-04 DIAGNOSIS — I10 Essential (primary) hypertension: Secondary | ICD-10-CM | POA: Diagnosis not present

## 2022-09-01 DIAGNOSIS — M1991 Primary osteoarthritis, unspecified site: Secondary | ICD-10-CM | POA: Diagnosis not present

## 2022-09-01 DIAGNOSIS — G894 Chronic pain syndrome: Secondary | ICD-10-CM | POA: Diagnosis not present

## 2022-09-01 DIAGNOSIS — I10 Essential (primary) hypertension: Secondary | ICD-10-CM | POA: Diagnosis not present

## 2022-09-01 DIAGNOSIS — M5136 Other intervertebral disc degeneration, lumbar region: Secondary | ICD-10-CM | POA: Diagnosis not present

## 2022-09-30 DIAGNOSIS — G894 Chronic pain syndrome: Secondary | ICD-10-CM | POA: Diagnosis not present

## 2022-09-30 DIAGNOSIS — M1991 Primary osteoarthritis, unspecified site: Secondary | ICD-10-CM | POA: Diagnosis not present

## 2022-09-30 DIAGNOSIS — Z0001 Encounter for general adult medical examination with abnormal findings: Secondary | ICD-10-CM | POA: Diagnosis not present

## 2022-09-30 DIAGNOSIS — I10 Essential (primary) hypertension: Secondary | ICD-10-CM | POA: Diagnosis not present

## 2022-09-30 DIAGNOSIS — M5136 Other intervertebral disc degeneration, lumbar region: Secondary | ICD-10-CM | POA: Diagnosis not present

## 2022-10-14 DIAGNOSIS — Z0001 Encounter for general adult medical examination with abnormal findings: Secondary | ICD-10-CM | POA: Diagnosis not present

## 2022-10-14 DIAGNOSIS — R7309 Other abnormal glucose: Secondary | ICD-10-CM | POA: Diagnosis not present

## 2022-10-14 DIAGNOSIS — E559 Vitamin D deficiency, unspecified: Secondary | ICD-10-CM | POA: Diagnosis not present

## 2022-10-14 DIAGNOSIS — E782 Mixed hyperlipidemia: Secondary | ICD-10-CM | POA: Diagnosis not present

## 2022-10-14 DIAGNOSIS — D518 Other vitamin B12 deficiency anemias: Secondary | ICD-10-CM | POA: Diagnosis not present

## 2022-10-14 DIAGNOSIS — E063 Autoimmune thyroiditis: Secondary | ICD-10-CM | POA: Diagnosis not present

## 2022-10-23 DIAGNOSIS — G894 Chronic pain syndrome: Secondary | ICD-10-CM | POA: Diagnosis not present

## 2022-10-23 DIAGNOSIS — M1991 Primary osteoarthritis, unspecified site: Secondary | ICD-10-CM | POA: Diagnosis not present

## 2022-10-23 DIAGNOSIS — I10 Essential (primary) hypertension: Secondary | ICD-10-CM | POA: Diagnosis not present

## 2022-10-23 DIAGNOSIS — M5136 Other intervertebral disc degeneration, lumbar region: Secondary | ICD-10-CM | POA: Diagnosis not present

## 2022-11-24 DIAGNOSIS — M1991 Primary osteoarthritis, unspecified site: Secondary | ICD-10-CM | POA: Diagnosis not present

## 2022-11-24 DIAGNOSIS — M5136 Other intervertebral disc degeneration, lumbar region: Secondary | ICD-10-CM | POA: Diagnosis not present

## 2022-11-24 DIAGNOSIS — G894 Chronic pain syndrome: Secondary | ICD-10-CM | POA: Diagnosis not present

## 2022-11-24 DIAGNOSIS — I10 Essential (primary) hypertension: Secondary | ICD-10-CM | POA: Diagnosis not present

## 2022-12-24 DIAGNOSIS — G894 Chronic pain syndrome: Secondary | ICD-10-CM | POA: Diagnosis not present

## 2022-12-24 DIAGNOSIS — S0083XA Contusion of other part of head, initial encounter: Secondary | ICD-10-CM | POA: Diagnosis not present

## 2022-12-24 DIAGNOSIS — I10 Essential (primary) hypertension: Secondary | ICD-10-CM | POA: Diagnosis not present

## 2022-12-24 DIAGNOSIS — M5136 Other intervertebral disc degeneration, lumbar region: Secondary | ICD-10-CM | POA: Diagnosis not present

## 2022-12-24 DIAGNOSIS — M1991 Primary osteoarthritis, unspecified site: Secondary | ICD-10-CM | POA: Diagnosis not present

## 2023-01-20 DIAGNOSIS — M1991 Primary osteoarthritis, unspecified site: Secondary | ICD-10-CM | POA: Diagnosis not present

## 2023-01-20 DIAGNOSIS — M5136 Other intervertebral disc degeneration, lumbar region: Secondary | ICD-10-CM | POA: Diagnosis not present

## 2023-01-20 DIAGNOSIS — G894 Chronic pain syndrome: Secondary | ICD-10-CM | POA: Diagnosis not present

## 2023-01-20 DIAGNOSIS — I10 Essential (primary) hypertension: Secondary | ICD-10-CM | POA: Diagnosis not present

## 2023-02-16 DIAGNOSIS — I10 Essential (primary) hypertension: Secondary | ICD-10-CM | POA: Diagnosis not present

## 2023-02-16 DIAGNOSIS — M5136 Other intervertebral disc degeneration, lumbar region: Secondary | ICD-10-CM | POA: Diagnosis not present

## 2023-02-16 DIAGNOSIS — G894 Chronic pain syndrome: Secondary | ICD-10-CM | POA: Diagnosis not present

## 2023-02-16 DIAGNOSIS — M1991 Primary osteoarthritis, unspecified site: Secondary | ICD-10-CM | POA: Diagnosis not present

## 2023-03-12 DIAGNOSIS — M5136 Other intervertebral disc degeneration, lumbar region: Secondary | ICD-10-CM | POA: Diagnosis not present

## 2023-03-12 DIAGNOSIS — I10 Essential (primary) hypertension: Secondary | ICD-10-CM | POA: Diagnosis not present

## 2023-03-12 DIAGNOSIS — M1991 Primary osteoarthritis, unspecified site: Secondary | ICD-10-CM | POA: Diagnosis not present

## 2023-03-12 DIAGNOSIS — G894 Chronic pain syndrome: Secondary | ICD-10-CM | POA: Diagnosis not present

## 2023-04-10 DIAGNOSIS — I10 Essential (primary) hypertension: Secondary | ICD-10-CM | POA: Diagnosis not present

## 2023-04-10 DIAGNOSIS — G894 Chronic pain syndrome: Secondary | ICD-10-CM | POA: Diagnosis not present

## 2023-04-10 DIAGNOSIS — M5136 Other intervertebral disc degeneration, lumbar region: Secondary | ICD-10-CM | POA: Diagnosis not present

## 2023-04-10 DIAGNOSIS — J209 Acute bronchitis, unspecified: Secondary | ICD-10-CM | POA: Diagnosis not present

## 2023-05-04 DIAGNOSIS — G894 Chronic pain syndrome: Secondary | ICD-10-CM | POA: Diagnosis not present

## 2023-05-04 DIAGNOSIS — M5134 Other intervertebral disc degeneration, thoracic region: Secondary | ICD-10-CM | POA: Diagnosis not present

## 2023-05-04 DIAGNOSIS — M1991 Primary osteoarthritis, unspecified site: Secondary | ICD-10-CM | POA: Diagnosis not present

## 2023-05-04 DIAGNOSIS — I10 Essential (primary) hypertension: Secondary | ICD-10-CM | POA: Diagnosis not present

## 2023-05-04 DIAGNOSIS — Z23 Encounter for immunization: Secondary | ICD-10-CM | POA: Diagnosis not present

## 2023-05-14 DIAGNOSIS — M1991 Primary osteoarthritis, unspecified site: Secondary | ICD-10-CM | POA: Diagnosis not present

## 2023-05-14 DIAGNOSIS — I10 Essential (primary) hypertension: Secondary | ICD-10-CM | POA: Diagnosis not present

## 2023-05-14 DIAGNOSIS — G894 Chronic pain syndrome: Secondary | ICD-10-CM | POA: Diagnosis not present

## 2023-05-14 DIAGNOSIS — M5134 Other intervertebral disc degeneration, thoracic region: Secondary | ICD-10-CM | POA: Diagnosis not present

## 2023-06-02 DIAGNOSIS — M5134 Other intervertebral disc degeneration, thoracic region: Secondary | ICD-10-CM | POA: Diagnosis not present

## 2023-06-02 DIAGNOSIS — I700 Atherosclerosis of aorta: Secondary | ICD-10-CM | POA: Diagnosis not present

## 2023-06-02 DIAGNOSIS — M1991 Primary osteoarthritis, unspecified site: Secondary | ICD-10-CM | POA: Diagnosis not present

## 2023-06-02 DIAGNOSIS — I10 Essential (primary) hypertension: Secondary | ICD-10-CM | POA: Diagnosis not present

## 2023-06-02 DIAGNOSIS — G894 Chronic pain syndrome: Secondary | ICD-10-CM | POA: Diagnosis not present

## 2023-06-29 DIAGNOSIS — M1991 Primary osteoarthritis, unspecified site: Secondary | ICD-10-CM | POA: Diagnosis not present

## 2023-06-29 DIAGNOSIS — M5134 Other intervertebral disc degeneration, thoracic region: Secondary | ICD-10-CM | POA: Diagnosis not present

## 2023-06-29 DIAGNOSIS — I10 Essential (primary) hypertension: Secondary | ICD-10-CM | POA: Diagnosis not present

## 2023-06-29 DIAGNOSIS — G894 Chronic pain syndrome: Secondary | ICD-10-CM | POA: Diagnosis not present

## 2023-07-27 DIAGNOSIS — G894 Chronic pain syndrome: Secondary | ICD-10-CM | POA: Diagnosis not present

## 2023-07-27 DIAGNOSIS — J209 Acute bronchitis, unspecified: Secondary | ICD-10-CM | POA: Diagnosis not present

## 2023-07-27 DIAGNOSIS — I10 Essential (primary) hypertension: Secondary | ICD-10-CM | POA: Diagnosis not present

## 2023-07-27 DIAGNOSIS — M5134 Other intervertebral disc degeneration, thoracic region: Secondary | ICD-10-CM | POA: Diagnosis not present

## 2023-08-24 DIAGNOSIS — M5134 Other intervertebral disc degeneration, thoracic region: Secondary | ICD-10-CM | POA: Diagnosis not present

## 2023-08-24 DIAGNOSIS — J209 Acute bronchitis, unspecified: Secondary | ICD-10-CM | POA: Diagnosis not present

## 2023-08-24 DIAGNOSIS — I10 Essential (primary) hypertension: Secondary | ICD-10-CM | POA: Diagnosis not present

## 2023-08-24 DIAGNOSIS — M1991 Primary osteoarthritis, unspecified site: Secondary | ICD-10-CM | POA: Diagnosis not present

## 2023-08-24 DIAGNOSIS — I700 Atherosclerosis of aorta: Secondary | ICD-10-CM | POA: Diagnosis not present

## 2023-08-24 DIAGNOSIS — G894 Chronic pain syndrome: Secondary | ICD-10-CM | POA: Diagnosis not present

## 2023-09-24 DIAGNOSIS — G894 Chronic pain syndrome: Secondary | ICD-10-CM | POA: Diagnosis not present

## 2023-09-24 DIAGNOSIS — I10 Essential (primary) hypertension: Secondary | ICD-10-CM | POA: Diagnosis not present

## 2023-09-24 DIAGNOSIS — M1991 Primary osteoarthritis, unspecified site: Secondary | ICD-10-CM | POA: Diagnosis not present

## 2023-09-24 DIAGNOSIS — M5134 Other intervertebral disc degeneration, thoracic region: Secondary | ICD-10-CM | POA: Diagnosis not present

## 2023-10-27 DIAGNOSIS — Z9229 Personal history of other drug therapy: Secondary | ICD-10-CM | POA: Diagnosis not present

## 2023-10-27 DIAGNOSIS — M1991 Primary osteoarthritis, unspecified site: Secondary | ICD-10-CM | POA: Diagnosis not present

## 2023-10-27 DIAGNOSIS — G894 Chronic pain syndrome: Secondary | ICD-10-CM | POA: Diagnosis not present

## 2023-10-27 DIAGNOSIS — Z0001 Encounter for general adult medical examination with abnormal findings: Secondary | ICD-10-CM | POA: Diagnosis not present

## 2023-10-27 DIAGNOSIS — I700 Atherosclerosis of aorta: Secondary | ICD-10-CM | POA: Diagnosis not present

## 2023-10-27 DIAGNOSIS — M5134 Other intervertebral disc degeneration, thoracic region: Secondary | ICD-10-CM | POA: Diagnosis not present

## 2023-11-24 ENCOUNTER — Ambulatory Visit (HOSPITAL_COMMUNITY)
Admission: RE | Admit: 2023-11-24 | Discharge: 2023-11-24 | Disposition: A | Discharge status: Home / Self Care | Source: Ambulatory Visit | Attending: Internal Medicine | Admitting: Internal Medicine

## 2023-11-24 ENCOUNTER — Other Ambulatory Visit (HOSPITAL_COMMUNITY): Payer: Self-pay | Admitting: Internal Medicine

## 2023-11-24 DIAGNOSIS — S2232XA Fracture of one rib, left side, initial encounter for closed fracture: Secondary | ICD-10-CM | POA: Diagnosis not present

## 2023-11-24 DIAGNOSIS — R0781 Pleurodynia: Secondary | ICD-10-CM | POA: Diagnosis not present

## 2023-11-24 DIAGNOSIS — M5134 Other intervertebral disc degeneration, thoracic region: Secondary | ICD-10-CM | POA: Diagnosis not present

## 2023-11-24 DIAGNOSIS — M1991 Primary osteoarthritis, unspecified site: Secondary | ICD-10-CM | POA: Diagnosis not present

## 2023-11-24 DIAGNOSIS — G894 Chronic pain syndrome: Secondary | ICD-10-CM | POA: Diagnosis not present

## 2023-11-24 DIAGNOSIS — I10 Essential (primary) hypertension: Secondary | ICD-10-CM | POA: Diagnosis not present

## 2023-12-21 DIAGNOSIS — I10 Essential (primary) hypertension: Secondary | ICD-10-CM | POA: Diagnosis not present

## 2023-12-21 DIAGNOSIS — G894 Chronic pain syndrome: Secondary | ICD-10-CM | POA: Diagnosis not present

## 2023-12-21 DIAGNOSIS — M5134 Other intervertebral disc degeneration, thoracic region: Secondary | ICD-10-CM | POA: Diagnosis not present

## 2023-12-21 DIAGNOSIS — M1991 Primary osteoarthritis, unspecified site: Secondary | ICD-10-CM | POA: Diagnosis not present

## 2023-12-29 DIAGNOSIS — Z9229 Personal history of other drug therapy: Secondary | ICD-10-CM | POA: Diagnosis not present

## 2023-12-29 DIAGNOSIS — Z0001 Encounter for general adult medical examination with abnormal findings: Secondary | ICD-10-CM | POA: Diagnosis not present

## 2023-12-29 DIAGNOSIS — E7849 Other hyperlipidemia: Secondary | ICD-10-CM | POA: Diagnosis not present

## 2023-12-29 DIAGNOSIS — E559 Vitamin D deficiency, unspecified: Secondary | ICD-10-CM | POA: Diagnosis not present

## 2024-01-18 DIAGNOSIS — G894 Chronic pain syndrome: Secondary | ICD-10-CM | POA: Diagnosis not present

## 2024-01-18 DIAGNOSIS — M5136 Other intervertebral disc degeneration, lumbar region with discogenic back pain only: Secondary | ICD-10-CM | POA: Diagnosis not present

## 2024-01-18 DIAGNOSIS — M1991 Primary osteoarthritis, unspecified site: Secondary | ICD-10-CM | POA: Diagnosis not present

## 2024-01-18 DIAGNOSIS — I10 Essential (primary) hypertension: Secondary | ICD-10-CM | POA: Diagnosis not present

## 2024-01-18 DIAGNOSIS — R748 Abnormal levels of other serum enzymes: Secondary | ICD-10-CM | POA: Diagnosis not present

## 2024-01-20 ENCOUNTER — Other Ambulatory Visit (HOSPITAL_COMMUNITY): Payer: Self-pay | Admitting: Internal Medicine

## 2024-01-20 ENCOUNTER — Ambulatory Visit (HOSPITAL_COMMUNITY)
Admission: RE | Admit: 2024-01-20 | Discharge: 2024-01-20 | Disposition: A | Discharge status: Home / Self Care | Source: Ambulatory Visit | Attending: Internal Medicine | Admitting: Internal Medicine

## 2024-01-20 DIAGNOSIS — R7989 Other specified abnormal findings of blood chemistry: Secondary | ICD-10-CM | POA: Diagnosis not present

## 2024-01-20 DIAGNOSIS — Z9049 Acquired absence of other specified parts of digestive tract: Secondary | ICD-10-CM | POA: Diagnosis not present

## 2024-01-20 DIAGNOSIS — K838 Other specified diseases of biliary tract: Secondary | ICD-10-CM | POA: Diagnosis not present

## 2024-01-20 DIAGNOSIS — R748 Abnormal levels of other serum enzymes: Secondary | ICD-10-CM | POA: Insufficient documentation

## 2024-01-22 ENCOUNTER — Encounter: Payer: Self-pay | Admitting: Gastroenterology

## 2024-02-08 ENCOUNTER — Ambulatory Visit (INDEPENDENT_AMBULATORY_CARE_PROVIDER_SITE_OTHER): Admitting: Gastroenterology

## 2024-02-08 ENCOUNTER — Encounter: Payer: Self-pay | Admitting: Gastroenterology

## 2024-02-08 ENCOUNTER — Telehealth: Payer: Self-pay | Admitting: *Deleted

## 2024-02-08 VITALS — BP 130/82 | HR 56 | Temp 97.4°F | Ht 72.0 in | Wt 195.0 lb

## 2024-02-08 DIAGNOSIS — T402X5A Adverse effect of other opioids, initial encounter: Secondary | ICD-10-CM | POA: Diagnosis not present

## 2024-02-08 DIAGNOSIS — K7460 Unspecified cirrhosis of liver: Secondary | ICD-10-CM | POA: Diagnosis not present

## 2024-02-08 DIAGNOSIS — K5903 Drug induced constipation: Secondary | ICD-10-CM

## 2024-02-08 DIAGNOSIS — K5900 Constipation, unspecified: Secondary | ICD-10-CM | POA: Insufficient documentation

## 2024-02-08 DIAGNOSIS — K838 Other specified diseases of biliary tract: Secondary | ICD-10-CM | POA: Insufficient documentation

## 2024-02-08 DIAGNOSIS — Z8619 Personal history of other infectious and parasitic diseases: Secondary | ICD-10-CM | POA: Diagnosis not present

## 2024-02-08 DIAGNOSIS — F40240 Claustrophobia: Secondary | ICD-10-CM

## 2024-02-08 DIAGNOSIS — R7989 Other specified abnormal findings of blood chemistry: Secondary | ICD-10-CM | POA: Diagnosis not present

## 2024-02-08 DIAGNOSIS — R109 Unspecified abdominal pain: Secondary | ICD-10-CM | POA: Diagnosis not present

## 2024-02-08 MED ORDER — NALOXEGOL OXALATE 12.5 MG PO TABS: 12.5000 mg | ORAL_TABLET | Freq: Every day | ORAL | 0 refills | Status: AC

## 2024-02-08 MED ORDER — DIAZEPAM 10 MG PO TABS
10.0000 mg | ORAL_TABLET | Freq: Once | ORAL | 0 refills | Status: AC
Start: 2024-02-08 — End: 2024-02-08

## 2024-02-08 NOTE — Patient Instructions (Signed)
 Continue Linzess daily for constipation.  Add Movantik  12.5mg  daily for constipation, take one hour before or two hours after a meal.  Please complete labs at [facility].  MRI of your liver to be scheduled. You can take valium  10mg  one hour before MRI to help with claustrophobia. It can cause drowsiness so would not recommend driving.

## 2024-02-08 NOTE — Progress Notes (Signed)
 GI Office Note    Referring Provider: Bertell Satterfield, MD Primary Care Physician:  Bertell Satterfield, MD  Primary Gastroenterologist: formerly Dr. Golda  Chief Complaint   Chief Complaint  Patient presents with   abnormal liver labs    Here for abd liver labs, choledocholithiasis, and issues with constipation.      History of Present Illness   Jeremy Weeks is a 63 y.o. male presenting today at the request of Dr. Bertell for abnormal liver panel, dilated CBD, evaluate for stricture, choledocholithiasis.   History of HCV successfully treated with Harvoni in 2016. He had corasened liver with mildly irregular liver surface, Metavir fibrosis score of some F3+F4 on elastography at that time. Repeat u/s with elastography in 03/2019 with F2 +some F3. Last u/s 10/2019 with similar findings to prior u/s c/w changes of cirrhosis, no focal lesions. Plans for six month follow up but patient lost to follow up.  Labs from July 2025: Hepatitis A IgM negative, hepatitis B surface antigen negative, hepatitis B core IgM negative, hepatitis C antibody reactive, hepatitis C RNA not detected, white blood cell count 7.0, hemoglobin 13.9, MCV 100, platelets 140,000, total bilirubin 0.4, alkaline phosphatase 227, AST 50, ALT 47, creatinine 1.02, BUN 16, B12 450, folate 5.9, TSH 8.01, PSA 0.7, vitamin D 86.5.  Ruq u/s 01/2024: -liver parenchyma within normal limits -dilated common bile duct 11mm, consider MRI/MRCP to evaluate for possible stricture, obstructing mass, or choledocholithiasis  Today:  Has had some right sided abdominal pain recently. Sometimes into right back. Intermittent. Not related by meals. Went to PCP for this. Labs showed new elevation of LFTs since last year. Ruq u/s with dilated CBD as outlined. He states someone called his sister from the PCP and told her that he has Hep C. He is frustrated that she was called with results. She is his emergency contact only.   He has been having  constipation over the last 6 weeks.  His last good bowel movement was about 6 weeks ago after eating greasy squash/onions.  States he emptied his colon out.  He has had chronic constipation related to opioid use and typically Linzess has been controlled up until about 6 weeks ago.  Now he is having 1-2 small nonproductive bowel movements per week.  Stools are very dark.  Denies any blood per rectum.  He notes he has had some issues lately and has not been able to be as active.  He is staying at home quite a bit more.  This may be contributing to his constipation.  His appetite is good.  He is drinking plenty of water.  No alcohol use in more than 15 to 20 years.   Denies any recent surgery or fractures.  Patient reports no prior colonoscopy or endoscopy.  He states he typically does Cologuard's with his PCP and they have been negative per patient.  Wt Readings from Last 3 Encounters:  02/08/24 195 lb (88.5 kg)  11/08/21 222 lb (100.7 kg)  03/30/19 220 lb 8 oz (100 kg)      Medications   Current Outpatient Medications  Medication Sig Dispense Refill   aspirin  EC 81 MG tablet Take 81 mg by mouth daily.      busPIRone (BUSPAR) 15 MG tablet Take 15 mg by mouth 2 (two) times daily.     Cyanocobalamin  (VITAMIN B-12 PO) Take 1 tablet by mouth daily.     furosemide (LASIX) 20 MG tablet Take 20 mg by mouth 2 (  two) times daily.     levothyroxine  (SYNTHROID ) 137 MCG tablet Take 137 mcg by mouth daily.     LINZESS 290 MCG CAPS capsule Take 290 mcg by mouth daily.     Multiple Vitamin (MULTIVITAMIN) tablet Take 1 tablet by mouth daily.     oxycodone  (ROXICODONE ) 30 MG immediate release tablet Take 30 mg by mouth every 4 (four) hours as needed.     traZODone  (DESYREL ) 100 MG tablet TAKE ONE TABLET BY MOUTH AT BEDTIME AS NEEDED.  11   zolpidem (AMBIEN) 10 MG tablet Take 10 mg by mouth at bedtime.     No current facility-administered medications for this visit.    Allergies   Allergies as of  02/08/2024 - Review Complete 02/08/2024  Allergen Reaction Noted   Codeine Rash 03/09/2011    Past Medical History   Past Medical History:  Diagnosis Date   Chronic pain    Hepatic cirrhosis (HCC) 08/28/2016   Hepatitis C    Hypothyroidism    Thrombocytopenia (HCC) 01/22/2015    Past Surgical History   Past Surgical History:  Procedure Laterality Date   BACK SURGERY  2014   11 hour surgery   CHOLECYSTECTOMY      Past Family History   Family History  Problem Relation Age of Onset   Cancer Mother        in her hip   Colon cancer Neg Hx    Liver disease Neg Hx     Past Social History   Social History   Socioeconomic History   Marital status: Single    Spouse name: Not on file   Number of children: Not on file   Years of education: Not on file   Highest education level: Not on file  Occupational History   Not on file  Tobacco Use   Smoking status: Every Day    Current packs/day: 2.00    Average packs/day: 2.0 packs/day for 37.0 years (74.0 ttl pk-yrs)    Types: Cigarettes   Smokeless tobacco: Never  Substance and Sexual Activity   Alcohol use: No    Alcohol/week: 0.0 standard drinks of alcohol    Comment: quit drinking 27 yrs   Drug use: No   Sexual activity: Yes    Birth control/protection: None  Other Topics Concern   Not on file  Social History Narrative   Not on file   Social Drivers of Health   Financial Resource Strain: Not on file  Food Insecurity: Not on file  Transportation Needs: Not on file  Physical Activity: Not on file  Stress: Not on file  Social Connections: Not on file  Intimate Partner Violence: Not on file    Review of Systems   General: Negative for anorexia, weight loss, fever, chills, fatigue, weakness. Eyes: Negative for vision changes.  ENT: Negative for hoarseness, difficulty swallowing , nasal congestion. CV: Negative for chest pain, angina, palpitations, dyspnea on exertion, peripheral edema.  Respiratory: Negative  for dyspnea at rest, dyspnea on exertion, cough, sputum, wheezing.  GI: See history of present illness. GU:  Negative for dysuria, hematuria, urinary incontinence, urinary frequency, nocturnal urination.  MS: Negative for joint pain, ++low back pain.  Derm: Negative for rash or itching.  Neuro: Negative for weakness, abnormal sensation, seizure, frequent headaches, memory loss,  confusion.  Psych: Negative for anxiety, depression, suicidal ideation, hallucinations.  Endo: Negative for unusual weight change.  Heme: Negative for bruising or bleeding. Allergy: Negative for rash or hives.  Physical Exam  BP 130/82 (BP Location: Right Arm, Patient Position: Sitting, Cuff Size: Normal)   Pulse (!) 56   Temp (!) 97.4 F (36.3 C) (Oral)   Ht 6' (1.829 m)   Wt 195 lb (88.5 kg)   SpO2 100%   BMI 26.45 kg/m    General: Well-nourished, well-developed in no acute distress.  Head: Normocephalic, atraumatic.   Eyes: Conjunctiva pink, no icterus. Mouth: Oropharyngeal mucosa moist and pink  Neck: Supple without thyromegaly, masses, or lymphadenopathy.  Lungs: Clear to auscultation bilaterally.  Heart: Regular rate and rhythm, no murmurs rubs or gallops.  Abdomen: Bowel sounds are normal, nontender, nondistended, no hepatosplenomegaly or masses,  no abdominal bruits or hernia, no rebound or guarding.  Liver edge easily palpated in rcm in mcl Rectal: not performed Extremities: trace bilateral lower extremity edema. No clubbing or deformities.  Neuro: Alert and oriented x 4 , grossly normal neurologically.  Skin: Warm and dry, no rash or jaundice.   Psych: Alert and cooperative, normal mood and affect.  Labs   See hpi  Imaging Studies   US  Abdomen Limited RUQ (LIVER/GB) Result Date: 01/20/2024 CLINICAL DATA:  Elevated liver function tests EXAM: ULTRASOUND ABDOMEN LIMITED RIGHT UPPER QUADRANT COMPARISON:  Ultrasound abdomen 11/02/2019 FINDINGS: Gallbladder: Surgically absent Common bile  duct: Diameter: 11 mm Liver: Parenchymal echogenicity: Within normal limits Contours: Normal Lesions: None Portal vein: Patent.  Hepatopetal flow Other: None. IMPRESSION: Dilated common bile duct measuring up to 11 mm. Further evaluation with contrast enhanced abdominal MRI/MRCP should be considered to evaluate for possible stricture, obstructing mass, or choledocholith. Electronically Signed   By: Aliene Lloyd M.D.   On: 01/20/2024 16:34    Assessment/Plan:   Elevated LFTS/cirrhosis/treated HCV: new elevation of LFTs. HCV treated, with recent documented HCV negative. No etoh use. Patient lost to follow up since 2021. Should be having labs and hepatoma screening every six months -labs for MELD, AFP, work up of abnormal LFTs -MRI Abd/MRCP without and with contrast. Due to claustrophobia, will allow valium  10mg  one hour before his MRI  Right sided abdominal pain: -unrelated to meals -constipation may be contributing -noted to have dilated CBD up to 11mm. On prior u/s in 2020, CBD was 7mm. This may be combination of post-cholecystectomy state plus opioid use can also cause biliary dilation. Will need MRI to rule out obstructive etiology  Constipation: opioid-induced, worsening. Possible subtherapeutic levothyroxine  playing a role -continue Linzess 290mcg daily -add movantik  12.5mg  daily 1 hour before or 2 hours after a meal   Sonny RAMAN. Ezzard, MHS, PA-C [facility]

## 2024-02-08 NOTE — Telephone Encounter (Signed)
 Pt informed of MRI appt. He states he can't do that date. Pt was given number to CS to call and reschedule.

## 2024-02-08 NOTE — Telephone Encounter (Signed)
 LMOVM to return call  MRI scheduled for Monday 02/15/24, arrive at 9:15 pm to check in, NPO 4 hours prior. Scheduled at [facility]

## 2024-02-08 NOTE — Telephone Encounter (Signed)
 [facility] PA for MRI: Case Number: [id number] Review Date: 02/08/2024 2:50:19 PM Expiration Date: N/A Status: This member's benefit plan did not require a prior authorization for this request.

## 2024-02-15 ENCOUNTER — Other Ambulatory Visit (HOSPITAL_COMMUNITY)

## 2024-02-15 DIAGNOSIS — M5134 Other intervertebral disc degeneration, thoracic region: Secondary | ICD-10-CM | POA: Diagnosis not present

## 2024-02-15 DIAGNOSIS — R748 Abnormal levels of other serum enzymes: Secondary | ICD-10-CM | POA: Diagnosis not present

## 2024-02-15 DIAGNOSIS — G894 Chronic pain syndrome: Secondary | ICD-10-CM | POA: Diagnosis not present

## 2024-02-15 DIAGNOSIS — M51361 Other intervertebral disc degeneration, lumbar region with lower extremity pain only: Secondary | ICD-10-CM | POA: Diagnosis not present

## 2024-02-15 DIAGNOSIS — I10 Essential (primary) hypertension: Secondary | ICD-10-CM | POA: Diagnosis not present

## 2024-02-15 DIAGNOSIS — M1991 Primary osteoarthritis, unspecified site: Secondary | ICD-10-CM | POA: Diagnosis not present

## 2024-02-18 ENCOUNTER — Ambulatory Visit (HOSPITAL_COMMUNITY)
Admission: RE | Admit: 2024-02-18 | Discharge: 2024-02-18 | Disposition: A | Discharge status: Home / Self Care | Source: Ambulatory Visit | Attending: Gastroenterology | Admitting: Gastroenterology

## 2024-02-18 ENCOUNTER — Other Ambulatory Visit: Payer: Self-pay | Admitting: Gastroenterology

## 2024-02-18 DIAGNOSIS — Z9049 Acquired absence of other specified parts of digestive tract: Secondary | ICD-10-CM | POA: Diagnosis not present

## 2024-02-18 DIAGNOSIS — R109 Unspecified abdominal pain: Secondary | ICD-10-CM

## 2024-02-18 DIAGNOSIS — N281 Cyst of kidney, acquired: Secondary | ICD-10-CM | POA: Diagnosis not present

## 2024-02-18 DIAGNOSIS — R7989 Other specified abnormal findings of blood chemistry: Secondary | ICD-10-CM

## 2024-02-18 DIAGNOSIS — K838 Other specified diseases of biliary tract: Secondary | ICD-10-CM | POA: Insufficient documentation

## 2024-02-18 DIAGNOSIS — K5903 Drug induced constipation: Secondary | ICD-10-CM | POA: Diagnosis not present

## 2024-02-18 DIAGNOSIS — R1011 Right upper quadrant pain: Secondary | ICD-10-CM | POA: Diagnosis not present

## 2024-02-18 MED ORDER — GADOBUTROL 1 MMOL/ML IV SOLN
9.0000 mL | Freq: Once | INTRAVENOUS | Status: AC | PRN
  Administered 2024-02-18: 9 mL via INTRAVENOUS

## 2024-02-29 ENCOUNTER — Ambulatory Visit: Payer: Self-pay | Admitting: Gastroenterology

## 2024-02-29 ENCOUNTER — Telehealth: Payer: Self-pay

## 2024-02-29 NOTE — Telephone Encounter (Signed)
 See result note.

## 2024-02-29 NOTE — Telephone Encounter (Signed)
 Pt called wanting to know his results to his recent MRI. Please advise.

## 2024-03-11 DIAGNOSIS — R0789 Other chest pain: Secondary | ICD-10-CM | POA: Diagnosis not present

## 2024-03-11 DIAGNOSIS — S2242XA Multiple fractures of ribs, left side, initial encounter for closed fracture: Secondary | ICD-10-CM | POA: Diagnosis not present

## 2024-03-11 DIAGNOSIS — S0990XA Unspecified injury of head, initial encounter: Secondary | ICD-10-CM | POA: Diagnosis not present

## 2024-03-11 DIAGNOSIS — R109 Unspecified abdominal pain: Secondary | ICD-10-CM | POA: Diagnosis not present

## 2024-03-11 DIAGNOSIS — Z743 Need for continuous supervision: Secondary | ICD-10-CM | POA: Diagnosis not present

## 2024-03-11 DIAGNOSIS — I6529 Occlusion and stenosis of unspecified carotid artery: Secondary | ICD-10-CM | POA: Diagnosis not present

## 2024-03-11 DIAGNOSIS — S3981XA Other specified injuries of abdomen, initial encounter: Secondary | ICD-10-CM | POA: Diagnosis not present

## 2024-03-11 DIAGNOSIS — I6523 Occlusion and stenosis of bilateral carotid arteries: Secondary | ICD-10-CM | POA: Diagnosis not present

## 2024-03-11 DIAGNOSIS — R079 Chest pain, unspecified: Secondary | ICD-10-CM | POA: Diagnosis not present

## 2024-03-11 DIAGNOSIS — M7989 Other specified soft tissue disorders: Secondary | ICD-10-CM | POA: Diagnosis not present

## 2024-03-11 DIAGNOSIS — S2241XA Multiple fractures of ribs, right side, initial encounter for closed fracture: Secondary | ICD-10-CM | POA: Diagnosis not present

## 2024-03-11 DIAGNOSIS — Z5329 Procedure and treatment not carried out because of patient's decision for other reasons: Secondary | ICD-10-CM | POA: Diagnosis not present

## 2024-03-11 DIAGNOSIS — I10 Essential (primary) hypertension: Secondary | ICD-10-CM | POA: Diagnosis not present

## 2024-03-11 DIAGNOSIS — S0001XA Abrasion of scalp, initial encounter: Secondary | ICD-10-CM | POA: Diagnosis not present

## 2024-03-11 DIAGNOSIS — S098XXA Other specified injuries of head, initial encounter: Secondary | ICD-10-CM | POA: Diagnosis not present

## 2024-03-11 DIAGNOSIS — S1989XA Other specified injuries of other specified part of neck, initial encounter: Secondary | ICD-10-CM | POA: Diagnosis not present

## 2024-03-11 DIAGNOSIS — J439 Emphysema, unspecified: Secondary | ICD-10-CM | POA: Diagnosis not present

## 2024-03-11 DIAGNOSIS — M25561 Pain in right knee: Secondary | ICD-10-CM | POA: Diagnosis not present

## 2024-03-11 DIAGNOSIS — G4700 Insomnia, unspecified: Secondary | ICD-10-CM | POA: Diagnosis not present

## 2024-03-11 DIAGNOSIS — J432 Centrilobular emphysema: Secondary | ICD-10-CM | POA: Diagnosis not present

## 2024-03-11 DIAGNOSIS — E039 Hypothyroidism, unspecified: Secondary | ICD-10-CM | POA: Diagnosis not present

## 2024-03-11 DIAGNOSIS — G8929 Other chronic pain: Secondary | ICD-10-CM | POA: Diagnosis not present

## 2024-03-11 DIAGNOSIS — M47812 Spondylosis without myelopathy or radiculopathy, cervical region: Secondary | ICD-10-CM | POA: Diagnosis not present

## 2024-03-11 DIAGNOSIS — Z79899 Other long term (current) drug therapy: Secondary | ICD-10-CM | POA: Diagnosis not present
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
## 2024-03-12 DIAGNOSIS — S2242XA Multiple fractures of ribs, left side, initial encounter for closed fracture: Secondary | ICD-10-CM | POA: Diagnosis not present

## 2024-03-12 DIAGNOSIS — G4700 Insomnia, unspecified: Secondary | ICD-10-CM | POA: Diagnosis not present

## 2024-03-12 DIAGNOSIS — E039 Hypothyroidism, unspecified: Secondary | ICD-10-CM | POA: Diagnosis not present

## 2024-03-12 NOTE — Nursing Note (Signed)
 Complete head to toe skin assessment unable to be completed due to patient is refusal.
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
## 2024-03-12 NOTE — Nursing Note (Signed)
 Patient was yelling out saying he needed something for pain. Patient found sitting up in the bed. Patient c/o pain under left armpit. Oxycodone  30 mg immediate release tab given. Patient instructed on how to use the call bell so instead of yelling out he could push the call bell button if he needs help. Patient verified understanding.

## 2024-03-16 DIAGNOSIS — S060XAA Concussion with loss of consciousness status unknown, initial encounter: Secondary | ICD-10-CM | POA: Diagnosis not present

## 2024-03-16 DIAGNOSIS — G894 Chronic pain syndrome: Secondary | ICD-10-CM | POA: Diagnosis not present

## 2024-03-16 DIAGNOSIS — T07XXXA Unspecified multiple injuries, initial encounter: Secondary | ICD-10-CM | POA: Diagnosis not present

## 2024-03-16 DIAGNOSIS — M5134 Other intervertebral disc degeneration, thoracic region: Secondary | ICD-10-CM | POA: Diagnosis not present

## 2024-03-16 DIAGNOSIS — S2249XA Multiple fractures of ribs, unspecified side, initial encounter for closed fracture: Secondary | ICD-10-CM | POA: Diagnosis not present

## 2024-03-16 DIAGNOSIS — M1991 Primary osteoarthritis, unspecified site: Secondary | ICD-10-CM | POA: Diagnosis not present

## 2024-03-24 DIAGNOSIS — K5903 Drug induced constipation: Secondary | ICD-10-CM | POA: Diagnosis not present

## 2024-03-24 DIAGNOSIS — R109 Unspecified abdominal pain: Secondary | ICD-10-CM | POA: Diagnosis not present

## 2024-03-24 DIAGNOSIS — R7989 Other specified abnormal findings of blood chemistry: Secondary | ICD-10-CM | POA: Diagnosis not present

## 2024-03-24 DIAGNOSIS — K838 Other specified diseases of biliary tract: Secondary | ICD-10-CM | POA: Diagnosis not present

## 2024-03-27 LAB — IGG, IGA, IGM
IgA/Immunoglobulin A, Serum: 114 mg/dL (ref 61–437)
IgG (Immunoglobin G), Serum: 1275 mg/dL (ref 603–1613)
IgM (Immunoglobulin M), Srm: 153 mg/dL (ref 20–172)

## 2024-03-27 LAB — CBC WITH DIFFERENTIAL/PLATELET
Basophils Absolute: 0.1 x10E3/uL (ref 0.0–0.2)
Basos: 1 %
EOS (ABSOLUTE): 0.3 x10E3/uL (ref 0.0–0.4)
Eos: 5 %
Hematocrit: 42 % (ref 37.5–51.0)
Hemoglobin: 13.9 g/dL (ref 13.0–17.7)
Immature Grans (Abs): 0 x10E3/uL (ref 0.0–0.1)
Immature Granulocytes: 0 %
Lymphocytes Absolute: 2.2 x10E3/uL (ref 0.7–3.1)
Lymphs: 30 %
MCH: 32.2 pg (ref 26.6–33.0)
MCHC: 33.1 g/dL (ref 31.5–35.7)
MCV: 97 fL (ref 79–97)
Monocytes Absolute: 0.4 x10E3/uL (ref 0.1–0.9)
Monocytes: 6 %
Neutrophils Absolute: 4.3 x10E3/uL (ref 1.4–7.0)
Neutrophils: 58 %
Platelets: 188 x10E3/uL (ref 150–450)
RBC: 4.32 x10E6/uL (ref 4.14–5.80)
RDW: 12.5 % (ref 11.6–15.4)
WBC: 7.4 x10E3/uL (ref 3.4–10.8)

## 2024-03-27 LAB — AFP TUMOR MARKER: AFP, Serum, Tumor Marker: 4.3 ng/mL (ref 0.0–8.4)

## 2024-03-27 LAB — COMPREHENSIVE METABOLIC PANEL WITH GFR
ALT: 26 IU/L (ref 0–44)
AST: 37 IU/L (ref 0–40)
Albumin: 4.8 g/dL (ref 3.9–4.9)
Alkaline Phosphatase: 191 IU/L — ABNORMAL HIGH (ref 44–121)
BUN/Creatinine Ratio: 9 — ABNORMAL LOW (ref 10–24)
BUN: 9 mg/dL (ref 8–27)
Bilirubin Total: 0.4 mg/dL (ref 0.0–1.2)
CO2: 25 mmol/L (ref 20–29)
Calcium: 9.8 mg/dL (ref 8.6–10.2)
Chloride: 97 mmol/L (ref 96–106)
Creatinine, Ser: 1.01 mg/dL (ref 0.76–1.27)
Globulin, Total: 2.7 g/dL (ref 1.5–4.5)
Glucose: 104 mg/dL — ABNORMAL HIGH (ref 70–99)
Potassium: 4 mmol/L (ref 3.5–5.2)
Sodium: 138 mmol/L (ref 134–144)
Total Protein: 7.5 g/dL (ref 6.0–8.5)
eGFR: 84 mL/min/1.73 (ref >59)

## 2024-03-27 LAB — PROTIME-INR
INR: 1 (ref 0.9–1.2)
Prothrombin Time: 10.8 s (ref 9.1–12.0)

## 2024-03-27 LAB — ALPHA-1-ANTITRYPSIN PHENOTYP: A-1 Antitrypsin: 195 mg/dL — ABNORMAL HIGH (ref 101–187)

## 2024-03-27 LAB — IRON,TIBC AND FERRITIN PANEL
Ferritin: 243 ng/mL (ref 30–400)
Iron Saturation: 25 % (ref 15–55)
Iron: 95 ug/dL (ref 38–169)
Total Iron Binding Capacity: 375 ug/dL (ref 250–450)
UIBC: 280 ug/dL (ref 111–343)

## 2024-03-27 LAB — ANA: Anti Nuclear Antibody (ANA): NEGATIVE

## 2024-03-27 LAB — CERULOPLASMIN: Ceruloplasmin: 19.9 mg/dL (ref 16.0–31.0)

## 2024-03-27 LAB — TISSUE TRANSGLUTAMINASE, IGA: Transglutaminase IgA: <2 U/mL (ref 0–3)

## 2024-03-27 LAB — MITOCHONDRIAL ANTIBODIES: Mitochondrial Ab: <20 U (ref 0.0–20.0)

## 2024-03-29 DIAGNOSIS — G894 Chronic pain syndrome: Secondary | ICD-10-CM | POA: Diagnosis not present

## 2024-03-29 DIAGNOSIS — Z79891 Long term (current) use of opiate analgesic: Secondary | ICD-10-CM | POA: Diagnosis not present

## 2024-04-05 ENCOUNTER — Ambulatory Visit: Admitting: Gastroenterology

## 2024-05-11 ENCOUNTER — Ambulatory Visit: Admitting: Gastroenterology

## 2024-05-12 ENCOUNTER — Encounter: Payer: Self-pay | Admitting: Gastroenterology

## 2024-06-10 ENCOUNTER — Ambulatory Visit: Payer: Self-pay | Admitting: Family Medicine

## 2024-08-18 ENCOUNTER — Ambulatory Visit: Admitting: Family Medicine
